# Patient Record
Sex: Male | Born: 1965 | Race: White | Hispanic: No | Marital: Married | Smoking: Never smoker
Health system: Southern US, Community
[De-identification: ages and names within clinical notes are randomized; demographics above are authoritative.]

## PROBLEM LIST (undated history)

## (undated) DIAGNOSIS — I10 Essential (primary) hypertension: Secondary | ICD-10-CM

## (undated) DIAGNOSIS — K219 Gastro-esophageal reflux disease without esophagitis: Secondary | ICD-10-CM

## (undated) DIAGNOSIS — Z96651 Presence of right artificial knee joint: Secondary | ICD-10-CM

## (undated) DIAGNOSIS — E7800 Pure hypercholesterolemia, unspecified: Secondary | ICD-10-CM

## (undated) DIAGNOSIS — E039 Hypothyroidism, unspecified: Secondary | ICD-10-CM

## (undated) DIAGNOSIS — M1711 Unilateral primary osteoarthritis, right knee: Secondary | ICD-10-CM

## (undated) DIAGNOSIS — G709 Myoneural disorder, unspecified: Secondary | ICD-10-CM

## (undated) DIAGNOSIS — Z789 Other specified health status: Secondary | ICD-10-CM

## (undated) DIAGNOSIS — M1712 Unilateral primary osteoarthritis, left knee: Secondary | ICD-10-CM

## (undated) DIAGNOSIS — T7840XA Allergy, unspecified, initial encounter: Secondary | ICD-10-CM

## (undated) DIAGNOSIS — G4733 Obstructive sleep apnea (adult) (pediatric): Secondary | ICD-10-CM

## (undated) DIAGNOSIS — N400 Enlarged prostate without lower urinary tract symptoms: Secondary | ICD-10-CM

## (undated) HISTORY — DX: Hypothyroidism, unspecified: E03.9

## (undated) HISTORY — DX: Obstructive sleep apnea (adult) (pediatric): G47.33

## (undated) HISTORY — PX: JOINT REPLACEMENT: SHX530

## (undated) HISTORY — PX: NO PAST SURGERIES: SHX2092

## (undated) HISTORY — DX: Essential (primary) hypertension: I10

## (undated) HISTORY — DX: Pure hypercholesterolemia, unspecified: E78.00

---

## 1999-02-08 ENCOUNTER — Encounter: Payer: Self-pay | Admitting: Family Medicine

## 1999-02-08 ENCOUNTER — Ambulatory Visit (HOSPITAL_COMMUNITY): Admission: RE | Admit: 1999-02-08 | Discharge: 1999-02-08 | Payer: Self-pay | Admitting: Family Medicine

## 2003-11-11 ENCOUNTER — Ambulatory Visit (HOSPITAL_COMMUNITY): Admission: RE | Admit: 2003-11-11 | Discharge: 2003-11-11 | Payer: Self-pay | Admitting: Family Medicine

## 2009-10-16 ENCOUNTER — Ambulatory Visit (HOSPITAL_BASED_OUTPATIENT_CLINIC_OR_DEPARTMENT_OTHER): Admission: RE | Admit: 2009-10-16 | Discharge: 2009-10-16 | Payer: Self-pay | Admitting: Cardiovascular Disease

## 2009-11-05 ENCOUNTER — Ambulatory Visit: Payer: Self-pay | Admitting: Internal Medicine

## 2012-03-11 ENCOUNTER — Encounter: Payer: Self-pay | Admitting: *Deleted

## 2015-12-17 HISTORY — PX: MENISCUS REPAIR: SHX5179

## 2016-04-04 DIAGNOSIS — M1711 Unilateral primary osteoarthritis, right knee: Secondary | ICD-10-CM | POA: Diagnosis not present

## 2016-04-04 DIAGNOSIS — M1712 Unilateral primary osteoarthritis, left knee: Secondary | ICD-10-CM | POA: Diagnosis not present

## 2016-05-02 DIAGNOSIS — M170 Bilateral primary osteoarthritis of knee: Secondary | ICD-10-CM | POA: Diagnosis not present

## 2016-05-10 DIAGNOSIS — G4733 Obstructive sleep apnea (adult) (pediatric): Secondary | ICD-10-CM | POA: Diagnosis not present

## 2016-06-04 DIAGNOSIS — M170 Bilateral primary osteoarthritis of knee: Secondary | ICD-10-CM | POA: Diagnosis not present

## 2016-06-25 DIAGNOSIS — E039 Hypothyroidism, unspecified: Secondary | ICD-10-CM | POA: Diagnosis not present

## 2016-06-25 DIAGNOSIS — K219 Gastro-esophageal reflux disease without esophagitis: Secondary | ICD-10-CM | POA: Diagnosis not present

## 2016-06-25 DIAGNOSIS — I10 Essential (primary) hypertension: Secondary | ICD-10-CM | POA: Diagnosis not present

## 2016-06-25 DIAGNOSIS — E7800 Pure hypercholesterolemia, unspecified: Secondary | ICD-10-CM | POA: Diagnosis not present

## 2016-06-28 DIAGNOSIS — E7800 Pure hypercholesterolemia, unspecified: Secondary | ICD-10-CM | POA: Diagnosis not present

## 2016-06-28 DIAGNOSIS — I10 Essential (primary) hypertension: Secondary | ICD-10-CM | POA: Diagnosis not present

## 2016-07-04 DIAGNOSIS — M170 Bilateral primary osteoarthritis of knee: Secondary | ICD-10-CM | POA: Diagnosis not present

## 2016-07-11 DIAGNOSIS — M170 Bilateral primary osteoarthritis of knee: Secondary | ICD-10-CM | POA: Diagnosis not present

## 2016-07-18 DIAGNOSIS — M170 Bilateral primary osteoarthritis of knee: Secondary | ICD-10-CM | POA: Diagnosis not present

## 2016-09-03 DIAGNOSIS — H16102 Unspecified superficial keratitis, left eye: Secondary | ICD-10-CM | POA: Diagnosis not present

## 2016-09-10 DIAGNOSIS — H16102 Unspecified superficial keratitis, left eye: Secondary | ICD-10-CM | POA: Diagnosis not present

## 2016-11-19 DIAGNOSIS — M170 Bilateral primary osteoarthritis of knee: Secondary | ICD-10-CM | POA: Diagnosis not present

## 2016-11-26 DIAGNOSIS — G4733 Obstructive sleep apnea (adult) (pediatric): Secondary | ICD-10-CM | POA: Diagnosis not present

## 2016-12-14 DIAGNOSIS — M25562 Pain in left knee: Secondary | ICD-10-CM | POA: Diagnosis not present

## 2016-12-14 DIAGNOSIS — M25561 Pain in right knee: Secondary | ICD-10-CM | POA: Diagnosis not present

## 2017-01-15 DIAGNOSIS — J111 Influenza due to unidentified influenza virus with other respiratory manifestations: Secondary | ICD-10-CM | POA: Diagnosis not present

## 2017-01-29 ENCOUNTER — Encounter (HOSPITAL_COMMUNITY): Payer: Self-pay | Admitting: Physician Assistant

## 2017-01-29 DIAGNOSIS — G4733 Obstructive sleep apnea (adult) (pediatric): Secondary | ICD-10-CM | POA: Diagnosis present

## 2017-01-29 DIAGNOSIS — E559 Vitamin D deficiency, unspecified: Secondary | ICD-10-CM | POA: Diagnosis present

## 2017-01-29 DIAGNOSIS — M1711 Unilateral primary osteoarthritis, right knee: Secondary | ICD-10-CM | POA: Diagnosis present

## 2017-01-29 DIAGNOSIS — E039 Hypothyroidism, unspecified: Secondary | ICD-10-CM | POA: Diagnosis present

## 2017-01-29 DIAGNOSIS — M25561 Pain in right knee: Secondary | ICD-10-CM | POA: Diagnosis not present

## 2017-01-29 DIAGNOSIS — I10 Essential (primary) hypertension: Secondary | ICD-10-CM | POA: Diagnosis present

## 2017-01-29 NOTE — H&P (Signed)
UNI KNEE ADMISSION H&P  Patient is being admitted for right total knee arthroplasty.  Subjective:  Chief Complaint:right knee pain.  HPI: Bryan [name], 51 y.o. male, has a history of pain and functional disability in the right knee due to arthritis and has failed non-surgical conservative treatments for greater than 12 weeks to includeNSAID's and/or analgesics, corticosteriod injections, viscosupplementation injections, flexibility and strengthening excercises, use of assistive devices, weight reduction as appropriate and activity modification.  Onset of symptoms was gradual, starting 10 years ago with gradually worsening course since that time. The patient noted prior procedures on the knee to include  arthroscopy and menisectomy on the right knee(s).  Patient currently rates pain in the right knee(s) at 10 out of 10 with activity. Patient has night pain, worsening of pain with activity and weight bearing, pain that interferes with activities of daily living, crepitus and joint swelling.  Patient has evidence of subchondral sclerosis, periarticular osteophytes and joint space narrowing by imaging studies.  There is no active infection.  Patient Active Problem List   Diagnosis Date Noted  . Primary localized osteoarthritis of right knee   . OSA (obstructive sleep apnea)   . Hypothyroidism   . Hypertension   . Vitamin D deficiency    Past Medical History:  Diagnosis Date  . Bell's palsy   . Elevated cholesterol   . Hypertension   . Hypothyroidism   . OSA (obstructive sleep apnea)    uses CPAP  . Primary localized osteoarthritis of right knee   . Vitamin D deficiency     No past surgical history on file.  No prescriptions prior to admission.   Allergies  Allergen Reactions  . Lipitor [Atorvastatin Calcium] Other (See Comments)    Muscle aches.  . Penicillins Hives and Swelling    Has patient had a PCN reaction causing immediate rash, facial/tongue/throat swelling, SOB or  lightheadedness with hypotension:unsure Has patient had a PCN reaction causing severe rash involving mucus membranes or skin necrosis:unsure Has patient had a PCN reaction that required hospitalization:No Has patient had a PCN reaction occurring within the last 10 years:No If all of the above answers are "NO", then may proceed with Cephalosporin use.     Social History  Substance Use Topics  . Smoking status: Never Smoker  . Smokeless tobacco: Not on file  . Alcohol use 0.0 oz/week     Comment: 1-2 daily beer    Family History  Problem Relation Age of Onset  . Heart attack Father     stent x 2  . Atrial fibrillation Father   . Deep vein thrombosis Mother   . Rheum arthritis Mother   . Pulmonary embolism    . Hypothyroidism       Review of Systems  Constitutional: Negative.   HENT: Negative.   Eyes: Negative.   Respiratory: Negative.   Cardiovascular: Negative.   Gastrointestinal: Negative.   Genitourinary: Negative.   Musculoskeletal: Positive for back pain and joint pain.  Skin: Negative.   Neurological: Negative.   Endo/Heme/Allergies: Negative.   Psychiatric/Behavioral: Negative.     Objective:  Physical Exam  Constitutional: He is oriented to person, place, and time. He appears well-developed and well-nourished.  HENT:  Head: Normocephalic and atraumatic.  Eyes: Conjunctivae are normal. Pupils are equal, round, and reactive to light.  Neck: Neck supple.  Cardiovascular: Normal rate and regular rhythm.   Respiratory: Effort normal.  GI: Soft.  Genitourinary:  Genitourinary Comments: Not pertinent to current symptomatology therefore not   examined.  Musculoskeletal:   Examination of both knees reveals pain medially bilaterally.  1+ crepitation.  1+ synovitis.  Mild varus deformity.  Range of motion 0-120 degrees.  Knees are stable with normal patella tracking.  Vascular exam: Pulses are 2+ and symmetric.  Neurologic exam: Distal motor and sensory examination is  within normal limits.    Neurological: He is alert and oriented to person, place, and time.  Skin: Skin is warm and dry.  Psychiatric: He has a normal mood and affect. His behavior is normal.    Vital signs in last 24 hours: Temp:  [97.4 F (36.3 C)] 97.4 F (36.3 C) (02/14 1500) Pulse Rate:  [68] 68 (02/14 1500) BP: (156)/(80) 156/80 (02/14 1500) SpO2:  [96 %] 96 % (02/14 1500) Weight:  [145.6 kg (321 lb)] 145.6 kg (321 lb) (02/14 1500)  Labs:   Estimated body mass index is 41.21 kg/m as calculated from the following:   Height as of this encounter: 6' 2" (1.88 m).   Weight as of this encounter: 145.6 kg (321 lb).   Imaging Review Plain radiographs demonstrate severe degenerative joint disease of the right knee(s). The overall alignment issignificant varus. The bone quality appears to be good for age and reported activity level.  Assessment/Plan:  End stage arthritis, right knee  Principal Problem:   Primary localized osteoarthritis of right knee Active Problems:   OSA (obstructive sleep apnea)   Hypothyroidism   Hypertension   Vitamin D deficiency   The patient history, physical examination, clinical judgment of the provider and imaging studies are consistent with end stage degenerative joint disease of the right knee(s) and total knee arthroplasty is deemed medically necessary. The treatment options including medical management, injection therapy arthroscopy and arthroplasty were discussed at length. The risks and benefits of total knee arthroplasty were presented and reviewed. The risks due to aseptic loosening, infection, stiffness, patella tracking problems, thromboembolic complications and other imponderables were discussed. The patient acknowledged the explanation, agreed to proceed with the plan and consent was signed. Patient is being admitted for inpatient treatment for surgery, pain control, PT, OT, prophylactic antibiotics, VTE prophylaxis, progressive ambulation  and ADL's and discharge planning. The patient is planning to be discharged home with home health services  

## 2017-01-30 ENCOUNTER — Encounter (HOSPITAL_COMMUNITY): Payer: Self-pay

## 2017-01-30 ENCOUNTER — Ambulatory Visit (HOSPITAL_COMMUNITY)
Admission: RE | Admit: 2017-01-30 | Discharge: 2017-01-30 | Disposition: A | Discharge status: Home / Self Care | Payer: BLUE CROSS/BLUE SHIELD | Source: Ambulatory Visit | Attending: Anesthesiology | Admitting: Anesthesiology

## 2017-01-30 ENCOUNTER — Encounter (HOSPITAL_COMMUNITY)
Admission: RE | Admit: 2017-01-30 | Discharge: 2017-01-30 | Disposition: A | Discharge status: Home / Self Care | Payer: BLUE CROSS/BLUE SHIELD | Source: Ambulatory Visit | Attending: Orthopedic Surgery | Admitting: Orthopedic Surgery

## 2017-01-30 DIAGNOSIS — J111 Influenza due to unidentified influenza virus with other respiratory manifestations: Secondary | ICD-10-CM

## 2017-01-30 DIAGNOSIS — Z01818 Encounter for other preprocedural examination: Secondary | ICD-10-CM | POA: Diagnosis not present

## 2017-01-30 DIAGNOSIS — Z01812 Encounter for preprocedural laboratory examination: Secondary | ICD-10-CM | POA: Insufficient documentation

## 2017-01-30 DIAGNOSIS — J9811 Atelectasis: Secondary | ICD-10-CM | POA: Diagnosis not present

## 2017-01-30 DIAGNOSIS — Z0183 Encounter for blood typing: Secondary | ICD-10-CM | POA: Insufficient documentation

## 2017-01-30 DIAGNOSIS — M1711 Unilateral primary osteoarthritis, right knee: Secondary | ICD-10-CM | POA: Diagnosis not present

## 2017-01-30 DIAGNOSIS — I517 Cardiomegaly: Secondary | ICD-10-CM | POA: Diagnosis not present

## 2017-01-30 HISTORY — DX: Myoneural disorder, unspecified: G70.9

## 2017-01-30 LAB — COMPREHENSIVE METABOLIC PANEL
ALBUMIN: 3.8 g/dL (ref 3.5–5.0)
ALK PHOS: 53 U/L (ref 38–126)
ALT: 55 U/L (ref 17–63)
ANION GAP: 9 (ref 5–15)
AST: 34 U/L (ref 15–41)
BUN: 11 mg/dL (ref 6–20)
CO2: 26 mmol/L (ref 22–32)
Calcium: 9 mg/dL (ref 8.9–10.3)
Chloride: 106 mmol/L (ref 101–111)
Creatinine, Ser: 1.03 mg/dL (ref 0.61–1.24)
GFR calc Af Amer: >60 mL/min (ref >60)
GFR calc non Af Amer: >60 mL/min (ref >60)
GLUCOSE: 158 mg/dL — AB (ref 65–99)
Potassium: 4.2 mmol/L (ref 3.5–5.1)
SODIUM: 141 mmol/L (ref 135–145)
Total Bilirubin: 1 mg/dL (ref 0.3–1.2)
Total Protein: 6.6 g/dL (ref 6.5–8.1)

## 2017-01-30 LAB — CBC WITH DIFFERENTIAL/PLATELET
BASOS ABS: 0 10*3/uL (ref 0.0–0.1)
BASOS PCT: 0 %
EOS ABS: 0.1 10*3/uL (ref 0.0–0.7)
Eosinophils Relative: 1 %
HCT: 43.6 % (ref 39.0–52.0)
HEMOGLOBIN: 15 g/dL (ref 13.0–17.0)
Lymphocytes Relative: 27 %
Lymphs Abs: 1.7 10*3/uL (ref 0.7–4.0)
MCH: 31.4 pg (ref 26.0–34.0)
MCHC: 34.4 g/dL (ref 30.0–36.0)
MCV: 91.2 fL (ref 78.0–100.0)
MONOS PCT: 9 %
Monocytes Absolute: 0.6 10*3/uL (ref 0.1–1.0)
NEUTROS PCT: 63 %
Neutro Abs: 3.9 10*3/uL (ref 1.7–7.7)
Platelets: 254 10*3/uL (ref 150–400)
RBC: 4.78 MIL/uL (ref 4.22–5.81)
RDW: 13.1 % (ref 11.5–15.5)
WBC: 6.3 10*3/uL (ref 4.0–10.5)

## 2017-01-30 LAB — ABO/RH: ABO/RH(D): A POS

## 2017-01-30 LAB — TYPE AND SCREEN
ABO/RH(D): A POS
ANTIBODY SCREEN: NEGATIVE

## 2017-01-30 LAB — PROTIME-INR
INR: 0.96
Prothrombin Time: 12.7 seconds (ref 11.4–15.2)

## 2017-01-30 LAB — APTT: APTT: 28 s (ref 24–36)

## 2017-01-30 LAB — SURGICAL PCR SCREEN
MRSA, PCR: NEGATIVE
Staphylococcus aureus: POSITIVE — AB

## 2017-01-30 MED ORDER — CHLORHEXIDINE GLUCONATE 4 % EX LIQD: 60.0000 mL | Freq: Once | CUTANEOUS | Status: DC

## 2017-01-30 MED ORDER — POVIDONE-IODINE 7.5 % EX SOLN
Freq: Once | CUTANEOUS | Status: DC
  Filled 2017-01-30: qty 118

## 2017-01-30 NOTE — Progress Notes (Signed)
[facility] on [address] in [place], [place].  Patient notified

## 2017-01-30 NOTE — Progress Notes (Addendum)
PCP: Lurena Joiner, MD  Cardiologist: denies in past year  ECG: denies in past year  Stress: denies in past year  Cardiac Cath: denies in past year  ECHO: denies in past year  Chest x-ray: denies in past year

## 2017-01-30 NOTE — Pre-Procedure Instructions (Addendum)
    Bryan [name]  01/30/2017      [facility] [place] [name] [place] - [address]  [address] Phone: [phone] Fax: [phone]    Your procedure is scheduled on Mon. Feb 26 @ 0945.  Report to [facility] Admitting at [phone] A.M.  Call this number if you have problems the morning of surgery:  [phone]   Remember:  Do not eat food or drink liquids after midnight.  Take these medicines the morning of surgery with A SIP OF WATER nebivolol (bystolic), levothyroxine (synthroid), loratadine (claritin), raitidine (zantac).    Stop taking any Vitamins or Herbal Medications. No Goody's, BC's, Aleve, Advil, Motrin, Ibuprofen or fish oil.   Do not wear lotions, powders, or colognes, or deoderant.  Men may shave face and neck.  Do not bring valuables to the hospital.  [facility] is not responsible for any belongings or valuables.  Contacts, dentures or bridgework may not be worn into surgery.  Leave your suitcase in the car.  After surgery it may be brought to your room.  For patients admitted to the hospital, discharge time will be determined by your treatment team.  Patients discharged the day of surgery will not be allowed to drive home.    Special instructions: See attached  Please read over the following fact sheets that you were given. Pain Booklet, Coughing and Deep Breathing, MRSA Information and Surgical Site Infection Prevention

## 2017-01-31 LAB — URINE CULTURE: Culture: NO GROWTH

## 2017-02-03 DIAGNOSIS — E039 Hypothyroidism, unspecified: Secondary | ICD-10-CM | POA: Diagnosis not present

## 2017-02-03 DIAGNOSIS — R739 Hyperglycemia, unspecified: Secondary | ICD-10-CM | POA: Diagnosis not present

## 2017-02-03 DIAGNOSIS — E782 Mixed hyperlipidemia: Secondary | ICD-10-CM | POA: Diagnosis not present

## 2017-02-03 DIAGNOSIS — K219 Gastro-esophageal reflux disease without esophagitis: Secondary | ICD-10-CM | POA: Diagnosis not present

## 2017-02-03 DIAGNOSIS — I10 Essential (primary) hypertension: Secondary | ICD-10-CM | POA: Diagnosis not present

## 2017-02-10 ENCOUNTER — Encounter (HOSPITAL_COMMUNITY): Admission: RE | Payer: Self-pay | Source: Ambulatory Visit

## 2017-02-10 ENCOUNTER — Inpatient Hospital Stay (HOSPITAL_COMMUNITY)
Admission: RE | Admit: 2017-02-10 | Payer: BLUE CROSS/BLUE SHIELD | Source: Ambulatory Visit | Admitting: Orthopedic Surgery

## 2017-02-10 HISTORY — DX: Unilateral primary osteoarthritis, right knee: M17.11

## 2017-02-10 SURGERY — ARTHROPLASTY, KNEE, UNICOMPARTMENTAL
Anesthesia: General | Laterality: Right

## 2017-02-14 ENCOUNTER — Encounter: Payer: Self-pay | Admitting: Cardiovascular Disease

## 2017-02-14 ENCOUNTER — Ambulatory Visit (INDEPENDENT_AMBULATORY_CARE_PROVIDER_SITE_OTHER): Payer: BLUE CROSS/BLUE SHIELD | Admitting: Cardiovascular Disease

## 2017-02-14 VITALS — BP 154/88 | HR 68 | Ht 74.0 in | Wt 316.4 lb

## 2017-02-14 DIAGNOSIS — I10 Essential (primary) hypertension: Secondary | ICD-10-CM

## 2017-02-14 DIAGNOSIS — E785 Hyperlipidemia, unspecified: Secondary | ICD-10-CM | POA: Insufficient documentation

## 2017-02-14 DIAGNOSIS — Z01818 Encounter for other preprocedural examination: Secondary | ICD-10-CM | POA: Diagnosis not present

## 2017-02-14 DIAGNOSIS — Z01810 Encounter for preprocedural cardiovascular examination: Secondary | ICD-10-CM | POA: Diagnosis not present

## 2017-02-14 DIAGNOSIS — E7800 Pure hypercholesterolemia, unspecified: Secondary | ICD-10-CM

## 2017-02-14 DIAGNOSIS — Z8249 Family history of ischemic heart disease and other diseases of the circulatory system: Secondary | ICD-10-CM

## 2017-02-14 NOTE — Patient Instructions (Signed)
Medication Instructions: Your physician recommends that you continue on your current medications as directed. Please refer to the Current Medication list given to you today.   Testing/Procedures: Your physician has requested that you have an echocardiogram for preoperative clearance. Echocardiography is a painless test that uses sound waves to create images of your heart. It provides your doctor with information about the size and shape of your heart and how well your heart's chambers and valves are working. This procedure takes approximately one hour. There are no restrictions for this procedure.  Follow-Up:  Your physician recommends that you schedule a follow-up appointment as needed with Dr. Allyson Sabal.

## 2017-02-14 NOTE — Assessment & Plan Note (Signed)
History of hyperlipidemia on Crestor followed by his PCP 

## 2017-02-14 NOTE — Progress Notes (Signed)
02/14/2017 [name]   12/03/1966  [id number]  Primary Physician Lolita Patella, MD Primary Cardiologist: Runell Gess MD Roseanne Reno  HPI:  Mr. Limas  is a 51 year old severely overweight married Caucasian male father of 2 daughters was accompanied by his wife Waynetta Sandy who was a cath lab nurse and office nurse as well. He is referred for preoperative clearance before elective staged partial bilateral total knee replacements by Dr. Thurston Hole . He has no prior cardiac history. Risk factors include family history with father who has had multiple ischemic events and has an LVAD followed by Dr. Gala Romney.  He does have a history of treated hypertension and hyperlipidemia as well. He has never had a heart attack or stroke. He denies chest pain or shortness of breath although he does admit to being deconditioned. Screening EKG did show T-wave abnormalities and apparently a chest x-ray showed cardiomegaly.   Current Outpatient Prescriptions  Medication Sig Dispense Refill  . acetaminophen (TYLENOL 8 HOUR ARTHRITIS PAIN) 650 MG CR tablet Take 1,300 mg by mouth 2 (two) times daily as needed for pain.     . clobetasol ointment (TEMOVATE) 0.05 % Apply 1 application topically daily as needed (for psorasis).     Marland Kitchen diclofenac (VOLTAREN) 75 MG EC tablet Take 75 mg by mouth 2 (two) times daily.    . Emollient (MOISTURIZING LOTION EX) Apply 1 application topically 3 (three) times daily as needed (for dry skin therapy.). [place] Hand Lotion.    Marland Kitchen levothyroxine (SYNTHROID, LEVOTHROID) 300 MCG tablet Take 300 mcg by mouth daily before breakfast.    . lisinopril (PRINIVIL,ZESTRIL) 20 MG tablet Take 20 mg by mouth daily.    Marland Kitchen loratadine (CLARITIN) 10 MG tablet Take 10 mg by mouth daily.    . nebivolol (BYSTOLIC) 10 MG tablet Take 10 mg by mouth daily.    . ranitidine (ZANTAC) 150 MG capsule Take 150 mg by mouth 2 (two) times daily.    . rosuvastatin (CRESTOR) 20 MG tablet Take 20 mg  by mouth at bedtime.     . tamsulosin (FLOMAX) 0.4 MG CAPS capsule Take 0.4 mg by mouth at bedtime.     No current facility-administered medications for this visit.     Allergies  Allergen Reactions  . Lipitor [Atorvastatin Calcium] Other (See Comments)    Muscle aches.  . Penicillins Hives and Swelling    Has patient had a PCN reaction causing immediate rash, facial/tongue/throat swelling, SOB or lightheadedness with hypotension:unsure Has patient had a PCN reaction causing severe rash involving mucus membranes or skin necrosis:unsure Has patient had a PCN reaction that required hospitalization:No Has patient had a PCN reaction occurring within the last 10 years:No If all of the above answers are "NO", then may proceed with Cephalosporin use.     Social History   Social History  . Marital status: Single    Spouse name: N/A  . Number of children: N/A  . Years of education: N/A   Occupational History  . 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 Smoking status: Never Smoker  . Smokeless tobacco: Former Neurosurgeon    Quit date: 01/30/2007  . Alcohol use 0.0 oz/week     Comment: 1-2 daily beer  . Drug use: No  . Sexual activity: Not on file   Other Topics Concern  . Not on file   Social History Narrative  . No narrative on  file     Review of Systems: General: negative for chills, fever, night sweats or weight changes.  Cardiovascular: negative for chest pain, dyspnea on exertion, edema, orthopnea, palpitations, paroxysmal nocturnal dyspnea or shortness of breath Dermatological: negative for rash Respiratory: negative for cough or wheezing Urologic: negative for hematuria Abdominal: negative for nausea, vomiting, diarrhea, bright red blood per rectum, melena, or hematemesis Neurologic: negative for visual changes, syncope, or dizziness All other systems reviewed and are otherwise negative except as noted above.    Blood pressure  (!) 154/88, pulse 68, height 6\' 2"  (1.88 m), weight (!) 316 lb 6.4 oz (143.5 kg), SpO2 97 %.  General appearance: alert and no distress Neck: no adenopathy, no carotid bruit, no JVD, supple, symmetrical, trachea midline and thyroid not enlarged, symmetric, no tenderness/mass/nodules Lungs: clear to auscultation bilaterally Heart: regular rate and rhythm, S1, S2 normal, no murmur, click, rub or gallop Extremities: extremities normal, atraumatic, no cyanosis or edema  EKG not performed today. EKG performed 01/30/17 showed sinus rhythm at 77 with nonspecific ST and T-wave changes. I personally reviewed that EKG.  ASSESSMENT AND PLAN:   Preoperative clearance Derran presents today for preoperative clearance before staged bilateral partial total knee replacements by Dr. Thurston Hole . He has a history of treated hypertension, hyperlipidemia, family history of heart disease. He does have a mildly abnormal EKG with inferior T-wave inversion apparently had cardiomegaly on chest x-ray. He has no history of prior heart attack or stroke nor does he have chest pain or shortness of breath. I am going to get a 2-D echo to evaluate LV size and function. This is normal we will clear him for his upcoming orthopedic surgical procedure at low risk.  Hypertension History of essential hypertension with blood pressure measured today at 154/88. He is on Bystolic  and Prinivil. Continue current meds at current dosing.  Hyperlipidemia History of hyperlipidemia on Crestor followed by his PCP      Runell Gess MD [facility], [facility] 02/14/2017 8:56 AM

## 2017-02-14 NOTE — Assessment & Plan Note (Signed)
History of essential hypertension with blood pressure measured today at 154/88. He is on Bystolic  and Prinivil. Continue current meds at current dosing.

## 2017-02-14 NOTE — Assessment & Plan Note (Signed)
Bryan [name] presents today for preoperative clearance before staged bilateral partial total knee replacements by Dr. Thurston Hole . He has a history of treated hypertension, hyperlipidemia, family history of heart disease. He does have a mildly abnormal EKG with inferior T-wave inversion apparently had cardiomegaly on chest x-ray. He has no history of prior heart attack or stroke nor does he have chest pain or shortness of breath. I am going to get a 2-D echo to evaluate LV size and function. This is normal we will clear him for his upcoming orthopedic surgical procedure at low risk.

## 2017-02-17 NOTE — Progress Notes (Addendum)
Called and left confidential VM for Sherri G. @ Dr. Sherene Sires office requesting orders be put in [facility]. EF, RN 02/17/17 1430   Called and spoke with Sherri G. @ Dr. Sherene Sires office and requested orders be entered into [facility]. She said she would let Dr. Thurston Hole know once he is out of the Or. Ef., RN 02/18/17 0930

## 2017-02-17 NOTE — Pre-Procedure Instructions (Signed)
    Bryan [name]  02/17/2017      [facility] [address] [place] [name] [name] - [name] [address] Phone: [phone] Fax: [phone]    Your procedure is scheduled on Wed. Mar. 14 @ 830 AM .  Report to [facility] at 630 A.M.  Call this number if you have problems the morning of surgery:  [phone]   Remember:  Do not eat food or drink liquids after midnight.  Take these medicines the morning of surgery with A SIP OF WATER levothyroxine (synthroid), nebivolol (bystolic), ranitidine (zantac).  Stop taking tylenol arthritis, diclofenac (voltaren), any herbal medication or vitamins, Goody's, Bc's, Aleve, Ibuprofen, Advil.   Do not wear jewelry, make-up or nail polish.  Do not wear lotions, powders, or perfumes, or deoderant.  Do not shave 48 hours prior to surgery.  Men may shave face and neck.  Do not bring valuables to the hospital.  [facility] is not responsible for any belongings or valuables.  Contacts, dentures or bridgework may not be worn into surgery.  Leave your suitcase in the car.  After surgery it may be brought to your room.  For patients admitted to the hospital, discharge time will be determined by your treatment team.  Patients discharged the day of surgery will not be allowed to drive home.    Special instructions:  See attached  Please read over the following fact sheets that you were given. Pain Booklet, Coughing and Deep Breathing, MRSA Information and Surgical Site Infection Prevention

## 2017-02-18 ENCOUNTER — Encounter (HOSPITAL_COMMUNITY)
Admission: RE | Admit: 2017-02-18 | Discharge: 2017-02-18 | Disposition: A | Discharge status: Home / Self Care | Payer: BLUE CROSS/BLUE SHIELD | Source: Ambulatory Visit | Attending: Orthopedic Surgery | Admitting: Orthopedic Surgery

## 2017-02-18 ENCOUNTER — Encounter (HOSPITAL_COMMUNITY): Payer: Self-pay

## 2017-02-18 ENCOUNTER — Encounter (HOSPITAL_COMMUNITY)
Admission: RE | Admit: 2017-02-18 | Discharge: 2017-02-18 | Disposition: A | Discharge status: Home / Self Care | Payer: BLUE CROSS/BLUE SHIELD | Source: Ambulatory Visit | Attending: Physician Assistant | Admitting: Physician Assistant

## 2017-02-18 DIAGNOSIS — M1711 Unilateral primary osteoarthritis, right knee: Secondary | ICD-10-CM | POA: Diagnosis not present

## 2017-02-18 DIAGNOSIS — I517 Cardiomegaly: Secondary | ICD-10-CM | POA: Diagnosis not present

## 2017-02-18 DIAGNOSIS — Z01818 Encounter for other preprocedural examination: Secondary | ICD-10-CM | POA: Diagnosis not present

## 2017-02-18 DIAGNOSIS — J111 Influenza due to unidentified influenza virus with other respiratory manifestations: Secondary | ICD-10-CM

## 2017-02-18 DIAGNOSIS — Z8249 Family history of ischemic heart disease and other diseases of the circulatory system: Secondary | ICD-10-CM | POA: Diagnosis not present

## 2017-02-18 DIAGNOSIS — I501 Left ventricular failure: Secondary | ICD-10-CM | POA: Diagnosis not present

## 2017-02-18 LAB — TYPE AND SCREEN
ABO/RH(D): A POS
ANTIBODY SCREEN: NEGATIVE

## 2017-02-18 LAB — BASIC METABOLIC PANEL
ANION GAP: 7 (ref 5–15)
BUN: 11 mg/dL (ref 6–20)
CALCIUM: 9.3 mg/dL (ref 8.9–10.3)
CO2: 23 mmol/L (ref 22–32)
Chloride: 110 mmol/L (ref 101–111)
Creatinine, Ser: 0.86 mg/dL (ref 0.61–1.24)
GLUCOSE: 106 mg/dL — AB (ref 65–99)
POTASSIUM: 3.9 mmol/L (ref 3.5–5.1)
Sodium: 140 mmol/L (ref 135–145)

## 2017-02-18 LAB — CBC
HEMATOCRIT: 43.6 % (ref 39.0–52.0)
Hemoglobin: 15.2 g/dL (ref 13.0–17.0)
MCH: 31.5 pg (ref 26.0–34.0)
MCHC: 34.9 g/dL (ref 30.0–36.0)
MCV: 90.3 fL (ref 78.0–100.0)
PLATELETS: 217 10*3/uL (ref 150–400)
RBC: 4.83 MIL/uL (ref 4.22–5.81)
RDW: 13 % (ref 11.5–15.5)
WBC: 5 10*3/uL (ref 4.0–10.5)

## 2017-02-18 LAB — SURGICAL PCR SCREEN
MRSA, PCR: NEGATIVE
STAPHYLOCOCCUS AUREUS: POSITIVE — AB

## 2017-02-18 NOTE — Progress Notes (Addendum)
PCP: Dr. Elias Else  Cardiologist: Dr. Rosita Fire  EKG: 01/2017  Stress Test: pt denies  ECHO: scheduled 02/20/17 at Dr. Hazle Coca office for final clearance per pt  Cardiac Cath: pt denies  Chest x-ray: 01/2017

## 2017-02-19 LAB — URINE CULTURE: CULTURE: NO GROWTH

## 2017-02-19 NOTE — Progress Notes (Addendum)
Anesthesia Chart Review:  Pt is a 51 year old male scheduled for R unicompartmental knee on 02/26/2017 with Salvatore Marvel, MD  - PCP is Elias Else, MD.  - Pt saw Nanetta Batty, MD with cardiology 02/14/17 for pre-op eval. Echo ordered for 02/20/17.   PMH includes:  HTN, hyperlipidemia, hypothyroidism, OSA. Never smoker. BMI 40.5  Medications include: Levothyroxine, lisinopril, nebivolol, Zantac, Crestor  Preoperative labs reviewed.    CXR 02/18/17: No active cardiopulmonary disease.  EKG 01/30/17: NSR. Nonspecific T wave abnormality  Will revisit chart when echo available.   Rica Mast, FNP-BC [facility]/Anesthesiology Phone: [phone] 02/19/2017 3:28 PM  Echo 02/20/17:  - Left ventricle: The cavity size was normal. Wall thickness was increased in a pattern of mild LVH. Systolic function was normal. The estimated ejection fraction was in the range of 60% to 65%. Doppler parameters are consistent with abnormal left ventricular relaxation (grade 1 diastolic dysfunction).  If no changes, I anticipate pt can proceed with surgery as scheduled.    Rica Mast, FNP-BC [facility] Stay Surgical [facility]/Anesthesiology Phone: [phone] 02/21/2017 2:39 PM

## 2017-02-20 ENCOUNTER — Ambulatory Visit (HOSPITAL_COMMUNITY)
Admission: RE | Admit: 2017-02-20 | Discharge: 2017-02-20 | Disposition: A | Discharge status: Home / Self Care | Payer: BLUE CROSS/BLUE SHIELD | Source: Ambulatory Visit | Attending: Cardiovascular Disease | Admitting: Cardiovascular Disease

## 2017-02-20 DIAGNOSIS — Z8249 Family history of ischemic heart disease and other diseases of the circulatory system: Secondary | ICD-10-CM | POA: Diagnosis not present

## 2017-02-20 DIAGNOSIS — Z01810 Encounter for preprocedural cardiovascular examination: Secondary | ICD-10-CM

## 2017-02-20 DIAGNOSIS — I501 Left ventricular failure: Secondary | ICD-10-CM | POA: Diagnosis not present

## 2017-02-20 DIAGNOSIS — M1711 Unilateral primary osteoarthritis, right knee: Secondary | ICD-10-CM | POA: Insufficient documentation

## 2017-02-20 DIAGNOSIS — I10 Essential (primary) hypertension: Secondary | ICD-10-CM

## 2017-02-20 DIAGNOSIS — I517 Cardiomegaly: Secondary | ICD-10-CM | POA: Insufficient documentation

## 2017-02-20 DIAGNOSIS — Z01818 Encounter for other preprocedural examination: Secondary | ICD-10-CM | POA: Insufficient documentation

## 2017-02-20 NOTE — Progress Notes (Signed)
  Echocardiogram 2D Echocardiogram has been performed.  Bryan [name] 02/20/2017, 11:06 AM

## 2017-02-25 MED ORDER — LACTATED RINGERS IV SOLN: INTRAVENOUS | Status: DC

## 2017-02-25 MED ORDER — SODIUM CHLORIDE 0.9 % IV SOLN
1500.0000 mg | INTRAVENOUS | Status: AC
  Administered 2017-02-26: 1500 mg via INTRAVENOUS
  Filled 2017-02-25: qty 1500

## 2017-02-26 ENCOUNTER — Encounter (HOSPITAL_COMMUNITY): Payer: Self-pay | Admitting: Urology

## 2017-02-26 ENCOUNTER — Ambulatory Visit (HOSPITAL_COMMUNITY)
Admission: RE | Admit: 2017-02-26 | Discharge: 2017-02-27 | Disposition: A | Discharge status: Home / Self Care | Payer: BLUE CROSS/BLUE SHIELD | Source: Ambulatory Visit | Attending: Orthopedic Surgery | Admitting: Orthopedic Surgery

## 2017-02-26 ENCOUNTER — Ambulatory Visit (HOSPITAL_COMMUNITY): Payer: BLUE CROSS/BLUE SHIELD | Admitting: Emergency Medicine

## 2017-02-26 ENCOUNTER — Encounter (HOSPITAL_COMMUNITY)
Admission: RE | Disposition: A | Discharge status: Home / Self Care | Payer: Self-pay | Source: Ambulatory Visit | Attending: Orthopedic Surgery

## 2017-02-26 ENCOUNTER — Ambulatory Visit (HOSPITAL_COMMUNITY): Payer: BLUE CROSS/BLUE SHIELD | Admitting: Certified Registered Nurse Anesthetist

## 2017-02-26 DIAGNOSIS — E039 Hypothyroidism, unspecified: Secondary | ICD-10-CM | POA: Diagnosis not present

## 2017-02-26 DIAGNOSIS — G4733 Obstructive sleep apnea (adult) (pediatric): Secondary | ICD-10-CM | POA: Diagnosis present

## 2017-02-26 DIAGNOSIS — Z9989 Dependence on other enabling machines and devices: Secondary | ICD-10-CM | POA: Insufficient documentation

## 2017-02-26 DIAGNOSIS — R785 Finding of other psychotropic drug in blood: Secondary | ICD-10-CM | POA: Diagnosis not present

## 2017-02-26 DIAGNOSIS — Z8249 Family history of ischemic heart disease and other diseases of the circulatory system: Secondary | ICD-10-CM

## 2017-02-26 DIAGNOSIS — E559 Vitamin D deficiency, unspecified: Secondary | ICD-10-CM | POA: Diagnosis present

## 2017-02-26 DIAGNOSIS — I10 Essential (primary) hypertension: Secondary | ICD-10-CM | POA: Diagnosis present

## 2017-02-26 DIAGNOSIS — Z79899 Other long term (current) drug therapy: Secondary | ICD-10-CM | POA: Insufficient documentation

## 2017-02-26 DIAGNOSIS — G8918 Other acute postprocedural pain: Secondary | ICD-10-CM | POA: Diagnosis not present

## 2017-02-26 DIAGNOSIS — E785 Hyperlipidemia, unspecified: Secondary | ICD-10-CM | POA: Diagnosis present

## 2017-02-26 DIAGNOSIS — E7800 Pure hypercholesterolemia, unspecified: Secondary | ICD-10-CM | POA: Diagnosis not present

## 2017-02-26 DIAGNOSIS — M1711 Unilateral primary osteoarthritis, right knee: Secondary | ICD-10-CM | POA: Diagnosis present

## 2017-02-26 DIAGNOSIS — M25561 Pain in right knee: Secondary | ICD-10-CM | POA: Diagnosis present

## 2017-02-26 HISTORY — PX: TOTAL KNEE ARTHROPLASTY: SHX125

## 2017-02-26 SURGERY — ARTHROPLASTY, KNEE, TOTAL
Anesthesia: Monitor Anesthesia Care | Site: Knee | Laterality: Right

## 2017-02-26 MED ORDER — MENTHOL 3 MG MT LOZG: 1.0000 | LOZENGE | OROMUCOSAL | Status: DC | PRN

## 2017-02-26 MED ORDER — BUPIVACAINE HCL (PF) 0.25 % IJ SOLN
INTRAMUSCULAR | Status: AC
  Filled 2017-02-26: qty 30

## 2017-02-26 MED ORDER — PHENOL 1.4 % MT LIQD: 1.0000 | OROMUCOSAL | Status: DC | PRN

## 2017-02-26 MED ORDER — POVIDONE-IODINE 7.5 % EX SOLN
Freq: Once | CUTANEOUS | Status: DC
  Filled 2017-02-26: qty 118

## 2017-02-26 MED ORDER — FAMOTIDINE 20 MG PO TABS
20.0000 mg | ORAL_TABLET | Freq: Two times a day (BID) | ORAL | Status: DC
  Administered 2017-02-26 – 2017-02-27 (×2): 20 mg via ORAL
  Filled 2017-02-26 (×2): qty 1

## 2017-02-26 MED ORDER — DEXAMETHASONE SODIUM PHOSPHATE 10 MG/ML IJ SOLN
INTRAMUSCULAR | Status: AC
  Filled 2017-02-26: qty 1

## 2017-02-26 MED ORDER — PROPOFOL 500 MG/50ML IV EMUL
INTRAVENOUS | Status: DC | PRN
  Administered 2017-02-26: 60 ug/kg/min via INTRAVENOUS

## 2017-02-26 MED ORDER — METOCLOPRAMIDE HCL 5 MG PO TABS: 5.0000 mg | ORAL_TABLET | Freq: Three times a day (TID) | ORAL | Status: DC | PRN

## 2017-02-26 MED ORDER — ACETAMINOPHEN 650 MG RE SUPP: 650.0000 mg | Freq: Four times a day (QID) | RECTAL | Status: DC | PRN

## 2017-02-26 MED ORDER — PHENYLEPHRINE 40 MCG/ML (10ML) SYRINGE FOR IV PUSH (FOR BLOOD PRESSURE SUPPORT)
PREFILLED_SYRINGE | INTRAVENOUS | Status: DC | PRN
  Administered 2017-02-26 (×2): 80 ug via INTRAVENOUS

## 2017-02-26 MED ORDER — LACTATED RINGERS IV SOLN
INTRAVENOUS | Status: DC | PRN
  Administered 2017-02-26: 08:00:00 via INTRAVENOUS

## 2017-02-26 MED ORDER — ALUM & MAG HYDROXIDE-SIMETH 200-200-20 MG/5ML PO SUSP
30.0000 mL | ORAL | Status: DC | PRN
Start: 2017-02-26 — End: 2017-02-27

## 2017-02-26 MED ORDER — TAMSULOSIN HCL 0.4 MG PO CAPS: 0.4000 mg | ORAL_CAPSULE | Freq: Every day | ORAL | Status: DC

## 2017-02-26 MED ORDER — DEXAMETHASONE SODIUM PHOSPHATE 10 MG/ML IJ SOLN
INTRAMUSCULAR | Status: DC | PRN
  Administered 2017-02-26: 10 mg via INTRAVENOUS

## 2017-02-26 MED ORDER — MIDAZOLAM HCL 5 MG/5ML IJ SOLN
INTRAMUSCULAR | Status: DC | PRN
  Administered 2017-02-26: 2 mg via INTRAVENOUS

## 2017-02-26 MED ORDER — ROPIVACAINE HCL 7.5 MG/ML IJ SOLN
INTRAMUSCULAR | Status: DC | PRN
  Administered 2017-02-26: 20 mL via PERINEURAL

## 2017-02-26 MED ORDER — TOBRAMYCIN SULFATE 1.2 G IJ SOLR
INTRAMUSCULAR | Status: DC | PRN
  Administered 2017-02-26: 1.2 g

## 2017-02-26 MED ORDER — PROPOFOL 10 MG/ML IV BOLUS
INTRAVENOUS | Status: AC
  Filled 2017-02-26: qty 20

## 2017-02-26 MED ORDER — ASPIRIN EC 325 MG PO TBEC
325.0000 mg | DELAYED_RELEASE_TABLET | Freq: Every day | ORAL | Status: DC
  Administered 2017-02-27: 325 mg via ORAL
  Filled 2017-02-26: qty 1

## 2017-02-26 MED ORDER — CHLORHEXIDINE GLUCONATE 4 % EX LIQD: 60.0000 mL | Freq: Once | CUTANEOUS | Status: DC

## 2017-02-26 MED ORDER — LISINOPRIL 20 MG PO TABS: 20.0000 mg | ORAL_TABLET | Freq: Every day | ORAL | Status: DC

## 2017-02-26 MED ORDER — ONDANSETRON HCL 4 MG PO TABS: 4.0000 mg | ORAL_TABLET | Freq: Four times a day (QID) | ORAL | Status: DC | PRN

## 2017-02-26 MED ORDER — CEFUROXIME SODIUM 750 MG IJ SOLR
INTRAMUSCULAR | Status: AC
  Filled 2017-02-26: qty 750

## 2017-02-26 MED ORDER — PHENYLEPHRINE 40 MCG/ML (10ML) SYRINGE FOR IV PUSH (FOR BLOOD PRESSURE SUPPORT)
PREFILLED_SYRINGE | INTRAVENOUS | Status: AC
  Filled 2017-02-26: qty 10

## 2017-02-26 MED ORDER — MEPERIDINE HCL 25 MG/ML IJ SOLN: 6.2500 mg | INTRAMUSCULAR | Status: DC | PRN

## 2017-02-26 MED ORDER — ONDANSETRON HCL 4 MG/2ML IJ SOLN
4.0000 mg | Freq: Once | INTRAMUSCULAR | Status: DC | PRN
Start: 2017-02-26 — End: 2017-02-26

## 2017-02-26 MED ORDER — POLYETHYLENE GLYCOL 3350 17 G PO PACK
17.0000 g | PACK | Freq: Two times a day (BID) | ORAL | Status: DC
  Administered 2017-02-26 – 2017-02-27 (×3): 17 g via ORAL
  Filled 2017-02-26 (×3): qty 1

## 2017-02-26 MED ORDER — FENTANYL CITRATE (PF) 100 MCG/2ML IJ SOLN
INTRAMUSCULAR | Status: AC
  Filled 2017-02-26: qty 2

## 2017-02-26 MED ORDER — OXYCODONE HCL 5 MG PO TABS
ORAL_TABLET | ORAL | Status: AC
  Administered 2017-02-26: 10 mg via ORAL
  Filled 2017-02-26: qty 2

## 2017-02-26 MED ORDER — HYDROMORPHONE HCL 1 MG/ML IJ SOLN
INTRAMUSCULAR | Status: AC
  Administered 2017-02-26: 0.5 mg via INTRAVENOUS
  Filled 2017-02-26: qty 0.5

## 2017-02-26 MED ORDER — LEVOTHYROXINE SODIUM 100 MCG PO TABS
300.0000 ug | ORAL_TABLET | Freq: Every day | ORAL | Status: DC
  Administered 2017-02-27: 300 ug via ORAL
  Filled 2017-02-26: qty 3

## 2017-02-26 MED ORDER — DOCUSATE SODIUM 100 MG PO CAPS
100.0000 mg | ORAL_CAPSULE | Freq: Two times a day (BID) | ORAL | Status: DC
  Administered 2017-02-26 – 2017-02-27 (×3): 100 mg via ORAL
  Filled 2017-02-26 (×3): qty 1

## 2017-02-26 MED ORDER — VANCOMYCIN HCL IN DEXTROSE 1-5 GM/200ML-% IV SOLN
1000.0000 mg | Freq: Two times a day (BID) | INTRAVENOUS | Status: AC
  Administered 2017-02-26: 1000 mg via INTRAVENOUS
  Filled 2017-02-26: qty 200

## 2017-02-26 MED ORDER — SODIUM CHLORIDE 0.9 % IV SOLN
1000.0000 mg | INTRAVENOUS | Status: AC
  Administered 2017-02-26: 1000 mg via INTRAVENOUS
  Filled 2017-02-26: qty 10

## 2017-02-26 MED ORDER — NEBIVOLOL HCL 5 MG PO TABS
10.0000 mg | ORAL_TABLET | Freq: Every day | ORAL | Status: DC
  Administered 2017-02-27: 10 mg via ORAL
  Filled 2017-02-26: qty 2

## 2017-02-26 MED ORDER — ROSUVASTATIN CALCIUM 10 MG PO TABS
20.0000 mg | ORAL_TABLET | Freq: Every day | ORAL | Status: DC
  Administered 2017-02-26: 20 mg via ORAL
  Filled 2017-02-26: qty 2

## 2017-02-26 MED ORDER — METOCLOPRAMIDE HCL 5 MG/ML IJ SOLN: 5.0000 mg | Freq: Three times a day (TID) | INTRAMUSCULAR | Status: DC | PRN

## 2017-02-26 MED ORDER — FENTANYL CITRATE (PF) 100 MCG/2ML IJ SOLN
INTRAMUSCULAR | Status: DC | PRN
  Administered 2017-02-26 (×2): 50 ug via INTRAVENOUS

## 2017-02-26 MED ORDER — BUPIVACAINE-EPINEPHRINE 0.25% -1:200000 IJ SOLN
INTRAMUSCULAR | Status: DC | PRN
  Administered 2017-02-26: 30 mL

## 2017-02-26 MED ORDER — HYDROMORPHONE HCL 1 MG/ML IJ SOLN: 1.0000 mg | INTRAMUSCULAR | Status: DC | PRN

## 2017-02-26 MED ORDER — PHENYLEPHRINE HCL 10 MG/ML IJ SOLN
INTRAVENOUS | Status: DC | PRN
  Administered 2017-02-26: 25 ug/min via INTRAVENOUS

## 2017-02-26 MED ORDER — ONDANSETRON HCL 4 MG/2ML IJ SOLN
4.0000 mg | Freq: Four times a day (QID) | INTRAMUSCULAR | Status: DC | PRN
  Administered 2017-02-26: 4 mg via INTRAVENOUS
  Filled 2017-02-26: qty 2

## 2017-02-26 MED ORDER — CLOBETASOL PROPIONATE 0.05 % EX OINT
1.0000 "application " | TOPICAL_OINTMENT | Freq: Every day | CUTANEOUS | Status: DC | PRN
  Filled 2017-02-26: qty 15

## 2017-02-26 MED ORDER — ACETAMINOPHEN 325 MG PO TABS: 650.0000 mg | ORAL_TABLET | Freq: Four times a day (QID) | ORAL | Status: DC | PRN

## 2017-02-26 MED ORDER — POTASSIUM CHLORIDE IN NACL 20-0.9 MEQ/L-% IV SOLN
INTRAVENOUS | Status: DC
  Administered 2017-02-26: 16:00:00 via INTRAVENOUS
  Filled 2017-02-26: qty 1000

## 2017-02-26 MED ORDER — EPINEPHRINE PF 1 MG/ML IJ SOLN
INTRAMUSCULAR | Status: AC
  Filled 2017-02-26: qty 1

## 2017-02-26 MED ORDER — HYDROMORPHONE HCL 2 MG/ML IJ SOLN: 1.0000 mg | INTRAMUSCULAR | Status: DC | PRN

## 2017-02-26 MED ORDER — ACETAMINOPHEN 500 MG PO TABS
1000.0000 mg | ORAL_TABLET | Freq: Four times a day (QID) | ORAL | Status: AC
  Administered 2017-02-26 – 2017-02-27 (×4): 1000 mg via ORAL
  Filled 2017-02-26 (×4): qty 2

## 2017-02-26 MED ORDER — SODIUM CHLORIDE 0.9 % IR SOLN
Status: DC | PRN
  Administered 2017-02-26: 1000 mL
  Administered 2017-02-26: 3000 mL

## 2017-02-26 MED ORDER — DEXAMETHASONE SODIUM PHOSPHATE 10 MG/ML IJ SOLN
10.0000 mg | Freq: Three times a day (TID) | INTRAMUSCULAR | Status: DC
  Administered 2017-02-26 – 2017-02-27 (×3): 10 mg via INTRAVENOUS
  Filled 2017-02-26 (×3): qty 1

## 2017-02-26 MED ORDER — TOBRAMYCIN SULFATE 1.2 G IJ SOLR
INTRAMUSCULAR | Status: AC
  Filled 2017-02-26: qty 2.4

## 2017-02-26 MED ORDER — CETAPHIL MOISTURIZING EX LOTN
1.0000 "application " | TOPICAL_LOTION | Freq: Three times a day (TID) | CUTANEOUS | Status: DC | PRN
  Filled 2017-02-26: qty 118

## 2017-02-26 MED ORDER — DIPHENHYDRAMINE HCL 12.5 MG/5ML PO ELIX: 12.5000 mg | ORAL_SOLUTION | ORAL | Status: DC | PRN

## 2017-02-26 MED ORDER — OXYCODONE HCL 5 MG PO TABS
5.0000 mg | ORAL_TABLET | ORAL | Status: DC | PRN
  Administered 2017-02-26 (×3): 10 mg via ORAL
  Administered 2017-02-26: 5 mg via ORAL
  Administered 2017-02-27 (×5): 10 mg via ORAL
  Filled 2017-02-26 (×8): qty 2

## 2017-02-26 MED ORDER — HYDROMORPHONE HCL 1 MG/ML IJ SOLN
0.2500 mg | INTRAMUSCULAR | Status: DC | PRN
  Administered 2017-02-26: 0.5 mg via INTRAVENOUS

## 2017-02-26 MED ORDER — MIDAZOLAM HCL 2 MG/2ML IJ SOLN
INTRAMUSCULAR | Status: AC
  Filled 2017-02-26: qty 2

## 2017-02-26 MED ORDER — PROPOFOL 10 MG/ML IV BOLUS
INTRAVENOUS | Status: DC | PRN
  Administered 2017-02-26 (×2): 20 mg via INTRAVENOUS
  Administered 2017-02-26: 40 mg via INTRAVENOUS

## 2017-02-26 SURGICAL SUPPLY — 73 items
BANDAGE ESMARK 6X9 LF (GAUZE/BANDAGES/DRESSINGS) ×1 IMPLANT
BENZOIN TINCTURE PRP APPL 2/3 (GAUZE/BANDAGES/DRESSINGS) ×2 IMPLANT
BLADE SAGITTAL 25.0X1.19X90 (BLADE) ×2 IMPLANT
BLADE SAW SGTL 13X75X1.27 (BLADE) ×2 IMPLANT
BLADE SURG 10 STRL SS (BLADE) ×8 IMPLANT
BNDG ELASTIC 6X15 VLCR STRL LF (GAUZE/BANDAGES/DRESSINGS) ×2 IMPLANT
BNDG ESMARK 6X9 LF (GAUZE/BANDAGES/DRESSINGS) ×2
BOWL SMART MIX CTS (DISPOSABLE) ×2 IMPLANT
CAP KNEE TOTAL 3 SIGMA ×2 IMPLANT
CEFUROXIME 750 MG POWDER IMPLANT
CEMENT HV SMART SET (Cement) ×4 IMPLANT
COVER SURGICAL LIGHT HANDLE (MISCELLANEOUS) ×2 IMPLANT
CUFF TOURNIQUET SINGLE 34IN LL (TOURNIQUET CUFF) ×2 IMPLANT
CUFF TOURNIQUET SINGLE 44IN (TOURNIQUET CUFF) IMPLANT
DECANTER SPIKE VIAL GLASS SM (MISCELLANEOUS) IMPLANT
DEPUY CMW 1 BONE CEMENT IMPLANT
DRAPE EXTREMITY T 121X128X90 (DRAPE) ×2 IMPLANT
DRAPE HALF SHEET 40X57 (DRAPES) ×2 IMPLANT
DRAPE INCISE IOBAN 66X45 STRL (DRAPES) IMPLANT
DRAPE PROXIMA HALF (DRAPES) ×2 IMPLANT
DRAPE U-SHAPE 47X51 STRL (DRAPES) ×2 IMPLANT
DRSG AQUACEL AG ADV 3.5X10 (GAUZE/BANDAGES/DRESSINGS) ×2 IMPLANT
DRSG AQUACEL AG ADV 3.5X14 (GAUZE/BANDAGES/DRESSINGS) ×2 IMPLANT
DURAPREP 26ML APPLICATOR (WOUND CARE) ×4 IMPLANT
ELECT CAUTERY BLADE 6.4 (BLADE) ×2 IMPLANT
ELECT REM PT RETURN 9FT ADLT (ELECTROSURGICAL) ×2
ELECTRODE REM PT RTRN 9FT ADLT (ELECTROSURGICAL) ×1 IMPLANT
FACESHIELD WRAPAROUND (MASK) IMPLANT
GLOVE BIO SURGEON STRL SZ7 (GLOVE) ×2 IMPLANT
GLOVE BIOGEL PI IND STRL 7.0 (GLOVE) ×1 IMPLANT
GLOVE BIOGEL PI IND STRL 7.5 (GLOVE) ×1 IMPLANT
GLOVE BIOGEL PI INDICATOR 7.0 (GLOVE) ×1
GLOVE BIOGEL PI INDICATOR 7.5 (GLOVE) ×1
GLOVE SS BIOGEL STRL SZ 7.5 (GLOVE) ×1 IMPLANT
GLOVE SUPERSENSE BIOGEL SZ 7.5 (GLOVE) ×1
GOWN STRL REUS W/ TWL LRG LVL3 (GOWN DISPOSABLE) ×4 IMPLANT
GOWN STRL REUS W/ TWL XL LVL3 (GOWN DISPOSABLE) ×2 IMPLANT
GOWN STRL REUS W/TWL LRG LVL3 (GOWN DISPOSABLE) ×4
GOWN STRL REUS W/TWL XL LVL3 (GOWN DISPOSABLE) ×2
HANDPIECE INTERPULSE COAX TIP (DISPOSABLE) ×1
HOOD PEEL AWAY FACE SHEILD DIS (HOOD) ×6 IMPLANT
IMMOBILIZER KNEE 22 UNIV (SOFTGOODS) IMPLANT
IMMOBILIZER KNEE 24 THIGH 36 (MISCELLANEOUS) ×1 IMPLANT
IMMOBILIZER KNEE 24 UNIV (MISCELLANEOUS) ×2
KIT BASIN OR (CUSTOM PROCEDURE TRAY) ×2 IMPLANT
KIT ROOM TURNOVER OR (KITS) ×2 IMPLANT
MANIFOLD NEPTUNE II (INSTRUMENTS) ×2 IMPLANT
MARKER SKIN DUAL TIP RULER LAB (MISCELLANEOUS) ×2 IMPLANT
NEEDLE 18GX1X1/2 (RX/OR ONLY) (NEEDLE) ×2 IMPLANT
NS IRRIG 1000ML POUR BTL (IV SOLUTION) ×2 IMPLANT
PACK TOTAL JOINT (CUSTOM PROCEDURE TRAY) ×2 IMPLANT
PAD ARMBOARD 7.5X6 YLW CONV (MISCELLANEOUS) ×4 IMPLANT
SET HNDPC FAN SPRY TIP SCT (DISPOSABLE) ×1 IMPLANT
SMART SET HV BONE CEMENT IMPLANT
STRIP CLOSURE SKIN 1/2X4 (GAUZE/BANDAGES/DRESSINGS) ×2 IMPLANT
SUCTION FRAZIER HANDLE 10FR (MISCELLANEOUS)
SUCTION TUBE FRAZIER 10FR DISP (MISCELLANEOUS) IMPLANT
SUT FIBERWIRE 2-0 18 17.9 3/8 (SUTURE) ×2
SUT MNCRL AB 3-0 PS2 18 (SUTURE) ×4 IMPLANT
SUT VIC AB 0 CT1 27 (SUTURE) ×2
SUT VIC AB 0 CT1 27XBRD ANBCTR (SUTURE) ×2 IMPLANT
SUT VIC AB 1 CT1 27 (SUTURE) ×2
SUT VIC AB 1 CT1 27XBRD ANBCTR (SUTURE) ×2 IMPLANT
SUT VIC AB 2-0 CT1 27 (SUTURE) ×2
SUT VIC AB 2-0 CT1 TAPERPNT 27 (SUTURE) ×2 IMPLANT
SUTURE FIBERWR 2-0 18 17.9 3/8 (SUTURE) ×1 IMPLANT
SYR 30ML LL (SYRINGE) ×2 IMPLANT
TOWEL OR 17X24 6PK STRL BLUE (TOWEL DISPOSABLE) ×2 IMPLANT
TOWEL OR 17X26 10 PK STRL BLUE (TOWEL DISPOSABLE) ×2 IMPLANT
TRAY CATH 16FR W/PLASTIC CATH (SET/KITS/TRAYS/PACK) IMPLANT
TRAY FOLEY CATH 16FR SILVER (SET/KITS/TRAYS/PACK) ×2 IMPLANT
TUBE CONNECTING 12X1/4 (SUCTIONS) ×2 IMPLANT
YANKAUER SUCT BULB TIP NO VENT (SUCTIONS) ×4 IMPLANT

## 2017-02-26 NOTE — Anesthesia Procedure Notes (Signed)
Spinal  Start time: 02/26/2017 8:30 AM End time: 02/26/2017 8:03 AM Staffing Anesthesiologist: Arta Bruce Performed: anesthesiologist  Preanesthetic Checklist Completed: patient identified, surgical consent, pre-op evaluation, timeout performed, IV checked, risks and benefits discussed and monitors and equipment checked Spinal Block Patient position: sitting Prep: DuraPrep Patient monitoring: heart rate, cardiac monitor, continuous pulse ox and blood pressure Approach: right paramedian Location: L3-4 Injection technique: single-shot Needle Needle type: Pencan  Needle gauge: 24 G Needle length: 9 cm Needle insertion depth: 8 cm

## 2017-02-26 NOTE — Evaluation (Signed)
Physical Therapy Evaluation Patient Details Name: Bryan [name] MRN: [id number] DOB: [birth year] Today's Date: 02/26/2017   History of Present Illness  51 y.o. male s/p Rt TKA. PMH: HTN, bells palsy.   Clinical Impression  Pt is s/p rt TKA resulting in the deficits listed below (see PT Problem List). Pt ambulating 15 ft with rw during initial PT session. Anticipating that the pt will D/C to home with family support following acute stay.  Pt will benefit from skilled PT to increase their independence and safety with mobility.     Follow Up Recommendations Home health PT;Supervision for mobility/OOB    Equipment Recommendations  Rolling walker with 5" wheels (bariatric)    Recommendations for Other Services       Precautions / Restrictions Precautions Precautions: Knee Precaution Booklet Issued: Yes (comment) Precaution Comments: HEP provided, reviewed knee extension precautions Required Braces or Orthoses: Knee Immobilizer - Right Knee Immobilizer - Right: Discontinue once straight leg raise with < 10 degree lag Restrictions Weight Bearing Restrictions: Yes RLE Weight Bearing: Weight bearing as tolerated      Mobility  Bed Mobility Overal bed mobility: Needs Assistance Bed Mobility: Supine to Sit     Supine to sit: Min guard;HOB elevated     General bed mobility comments: min guard for safety  Transfers Overall transfer level: Needs assistance Equipment used: Rolling walker (2 wheeled) Transfers: Sit to/from Stand Sit to Stand: Min guard         General transfer comment: cues for hand placement  Ambulation/Gait Ambulation/Gait assistance: Min guard Ambulation Distance (Feet): 15 Feet Assistive device: Rolling walker (2 wheeled) Gait Pattern/deviations: Step-to pattern;Decreased weight shift to right Gait velocity: decreased   General Gait Details: reinforcing WBAT  Stairs            Wheelchair Mobility    Modified Rankin (Stroke Patients  Only)       Balance Overall balance assessment: Needs assistance Sitting-balance support: No upper extremity supported Sitting balance-Leahy Scale: Good     Standing balance support: Bilateral upper extremity supported Standing balance-Leahy Scale: Poor Standing balance comment: using rw                             Pertinent Vitals/Pain Pain Assessment: 0-10 Pain Score: 7  Pain Location: Rt knee Pain Descriptors / Indicators: Aching Pain Intervention(s): Limited activity within patient's tolerance;Monitored during session    Home Living Family/patient expects to be discharged to:: Private residence Living Arrangements: Spouse/significant other Available Help at Discharge: Family;Available 24 hours/day Type of Home: House Home Access: Stairs to enter Entrance Stairs-Rails: None Entrance Stairs-Number of Steps: 4 Home Layout: One level Home Equipment: Crutches      Prior Function Level of Independence: Independent               Hand Dominance        Extremity/Trunk Assessment   Upper Extremity Assessment Upper Extremity Assessment: Overall WFL for tasks assessed    Lower Extremity Assessment Lower Extremity Assessment: RLE deficits/detail RLE Deficits / Details: fair quad activation, able to perform SLR with significant lag       Communication   Communication: No difficulties  Cognition Arousal/Alertness: Awake/alert Behavior During Therapy: WFL for tasks assessed/performed Overall Cognitive Status: Within Functional Limits for tasks assessed                      General Comments      Exercises Total  Joint Exercises Ankle Circles/Pumps: AROM;Both;10 reps Quad Sets: Strengthening;Right;10 reps   Assessment/Plan    PT Assessment Patient needs continued PT services  PT Problem List Decreased strength;Decreased range of motion;Decreased activity tolerance;Decreased balance;Decreased mobility       PT Treatment  Interventions DME instruction;Gait training;Stair training;Functional mobility training;Therapeutic exercise;Therapeutic activities;Patient/family education    PT Goals (Current goals can be found in the Care Plan section)  Acute Rehab PT Goals Patient Stated Goal: less pain PT Goal Formulation: With patient Time For Goal Achievement: 03/12/17 Potential to Achieve Goals: Good    Frequency 7X/week   Barriers to discharge        Co-evaluation               End of Session Equipment Utilized During Treatment: Gait belt;Right knee immobilizer Activity Tolerance: Patient tolerated treatment well Patient left: in chair;with call bell/phone within reach (in knee extension) Nurse Communication: Mobility status;Weight bearing status PT Visit Diagnosis: Muscle weakness (generalized) (M62.81);Difficulty in walking, not elsewhere classified (R26.2)         Time: 4098-1191 PT Time Calculation (min) (ACUTE ONLY): 31 min   Charges:   PT Evaluation $PT Eval Moderate Complexity: 1 Procedure PT Treatments $Gait Training: 8-22 mins   PT G Codes:         Christiane Ha, PT, CSCS Pager [phone] Office [phone]  02/26/2017, 4:05 PM

## 2017-02-26 NOTE — Interval H&P Note (Signed)
.  H&P done

## 2017-02-26 NOTE — Anesthesia Procedure Notes (Signed)
Procedure Name: MAC Date/Time: 02/26/2017 8:45 AM Performed by: Everlean Cherry A Pre-anesthesia Checklist: Patient identified, Emergency Drugs available, Suction available and Patient being monitored Patient Re-evaluated:Patient Re-evaluated prior to inductionOxygen Delivery Method: Simple face mask

## 2017-02-26 NOTE — Progress Notes (Signed)
Orthopedic Tech Progress Note Patient Details:  Bryan [name] 1966/07/01 [id number]  CPM Right Knee CPM Right Knee: On Right Knee Flexion (Degrees): 90 Right Knee Extension (Degrees): 0   Resa Rinks 02/26/2017, 11:43 AM Pt exceeds weight limit for trapeze bar patient helper; RN notified

## 2017-02-26 NOTE — Anesthesia Postprocedure Evaluation (Signed)
Anesthesia Post Note  Patient: Bryan [name]  Procedure(s) Performed: Procedure(s) (LRB): TOTAL KNEE ARTHROPLASTY (Right)  Patient location during evaluation: PACU Anesthesia Type: Spinal Level of consciousness: oriented and awake and alert Pain management: pain level controlled Vital Signs Assessment: post-procedure vital signs reviewed and stable Respiratory status: spontaneous breathing, respiratory function stable and patient connected to nasal cannula oxygen Cardiovascular status: blood pressure returned to baseline and stable Postop Assessment: no headache and no backache Anesthetic complications: no       Last Vitals:  Vitals:   02/26/17 1410 02/26/17 1459  BP: (!) 138/95 (!) 155/81  Pulse: 73 70  Resp: 18 18  Temp: 36.4 C 36.6 C    Last Pain:  Vitals:   02/26/17 1459  TempSrc: Oral  PainSc:                  [name]

## 2017-02-26 NOTE — H&P (View-Only) (Signed)
UNI KNEE ADMISSION H&P  Patient is being admitted for right total knee arthroplasty.  Subjective:  Chief Complaint:right knee pain.  HPI: Bryan [name], 51 y.o. male, has a history of pain and functional disability in the right knee due to arthritis and has failed non-surgical conservative treatments for greater than 12 weeks to includeNSAID's and/or analgesics, corticosteriod injections, viscosupplementation injections, flexibility and strengthening excercises, use of assistive devices, weight reduction as appropriate and activity modification.  Onset of symptoms was gradual, starting 10 years ago with gradually worsening course since that time. The patient noted prior procedures on the knee to include  arthroscopy and menisectomy on the right knee(s).  Patient currently rates pain in the right knee(s) at 10 out of 10 with activity. Patient has night pain, worsening of pain with activity and weight bearing, pain that interferes with activities of daily living, crepitus and joint swelling.  Patient has evidence of subchondral sclerosis, periarticular osteophytes and joint space narrowing by imaging studies.  There is no active infection.  Patient Active Problem List   Diagnosis Date Noted  . Primary localized osteoarthritis of right knee   . OSA (obstructive sleep apnea)   . Hypothyroidism   . Hypertension   . Vitamin D deficiency    Past Medical History:  Diagnosis Date  . Bell's palsy   . Elevated cholesterol   . Hypertension   . Hypothyroidism   . OSA (obstructive sleep apnea)    uses CPAP  . Primary localized osteoarthritis of right knee   . Vitamin D deficiency     No past surgical history on file.  No prescriptions prior to admission.   Allergies  Allergen Reactions  . Lipitor [Atorvastatin Calcium] Other (See Comments)    Muscle aches.  . Penicillins Hives and Swelling    Has patient had a PCN reaction causing immediate rash, facial/tongue/throat swelling, SOB or  lightheadedness with hypotension:unsure Has patient had a PCN reaction causing severe rash involving mucus membranes or skin necrosis:unsure Has patient had a PCN reaction that required hospitalization:No Has patient had a PCN reaction occurring within the last 10 years:No If all of the above answers are "NO", then may proceed with Cephalosporin use.     Social History  Substance Use Topics  . Smoking status: Never Smoker  . Smokeless tobacco: Not on file  . Alcohol use 0.0 oz/week     Comment: 1-2 daily beer    Family History  Problem Relation Age of Onset  . Heart attack Father     stent x 2  . Atrial fibrillation Father   . Deep vein thrombosis Mother   . Rheum arthritis Mother   . Pulmonary embolism    . Hypothyroidism       Review of Systems  Constitutional: Negative.   HENT: Negative.   Eyes: Negative.   Respiratory: Negative.   Cardiovascular: Negative.   Gastrointestinal: Negative.   Genitourinary: Negative.   Musculoskeletal: Positive for back pain and joint pain.  Skin: Negative.   Neurological: Negative.   Endo/Heme/Allergies: Negative.   Psychiatric/Behavioral: Negative.     Objective:  Physical Exam  Constitutional: He is oriented to person, place, and time. He appears well-developed and well-nourished.  HENT:  Head: Normocephalic and atraumatic.  Eyes: Conjunctivae are normal. Pupils are equal, round, and reactive to light.  Neck: Neck supple.  Cardiovascular: Normal rate and regular rhythm.   Respiratory: Effort normal.  GI: Soft.  Genitourinary:  Genitourinary Comments: Not pertinent to current symptomatology therefore not  examined.  Musculoskeletal:   Examination of both knees reveals pain medially bilaterally.  1+ crepitation.  1+ synovitis.  Mild varus deformity.  Range of motion 0-120 degrees.  Knees are stable with normal patella tracking.  Vascular exam: Pulses are 2+ and symmetric.  Neurologic exam: Distal motor and sensory examination is  within normal limits.    Neurological: He is alert and oriented to person, place, and time.  Skin: Skin is warm and dry.  Psychiatric: He has a normal mood and affect. His behavior is normal.    Vital signs in last 24 hours: Temp:  [97.4 F (36.3 C)] 97.4 F (36.3 C) (02/14 1500) Pulse Rate:  [68] 68 (02/14 1500) BP: (156)/(80) 156/80 (02/14 1500) SpO2:  [96 %] 96 % (02/14 1500) Weight:  [145.6 kg (321 lb)] 145.6 kg (321 lb) (02/14 1500)  Labs:   Estimated body mass index is 41.21 kg/m as calculated from the following:   Height as of this encounter: 6\' 2"  (1.88 m).   Weight as of this encounter: 145.6 kg (321 lb).   Imaging Review Plain radiographs demonstrate severe degenerative joint disease of the right knee(s). The overall alignment issignificant varus. The bone quality appears to be good for age and reported activity level.  Assessment/Plan:  End stage arthritis, right knee  Principal Problem:   Primary localized osteoarthritis of right knee Active Problems:   OSA (obstructive sleep apnea)   Hypothyroidism   Hypertension   Vitamin D deficiency   The patient history, physical examination, clinical judgment of the provider and imaging studies are consistent with end stage degenerative joint disease of the right knee(s) and total knee arthroplasty is deemed medically necessary. The treatment options including medical management, injection therapy arthroscopy and arthroplasty were discussed at length. The risks and benefits of total knee arthroplasty were presented and reviewed. The risks due to aseptic loosening, infection, stiffness, patella tracking problems, thromboembolic complications and other imponderables were discussed. The patient acknowledged the explanation, agreed to proceed with the plan and consent was signed. Patient is being admitted for inpatient treatment for surgery, pain control, PT, OT, prophylactic antibiotics, VTE prophylaxis, progressive ambulation  and ADL's and discharge planning. The patient is planning to be discharged home with home health services

## 2017-02-26 NOTE — Anesthesia Procedure Notes (Signed)
Anesthesia Regional Block: Adductor canal block   Pre-Anesthetic Checklist: ,, timeout performed, Correct Patient, Correct Site, Correct Laterality, Correct Procedure, Correct Position, site marked, Risks and benefits discussed,  Surgical consent,  Pre-op evaluation,  At surgeon's request and post-op pain management  Laterality: Right  Prep: chloraprep       Needles:  Injection technique: Single-shot  Needle Type: Echogenic Stimulator Needle     Needle Length: 9cm  Needle Gauge: 21     Additional Needles:   Procedures: ultrasound guided,,,,,,,,  Narrative:  Start time: 02/26/2017 8:15 AM End time: 02/26/2017 8:25 AM Injection made incrementally with aspirations every 5 mL.  Performed by: Personally  Anesthesiologist: Arta Bruce  Additional Notes: Monitors applied. Patient sedated. Sterile prep and drape,hand hygiene and sterile gloves were used. Relevant anatomy identified.Needle position confirmed.Local anesthetic injected incrementally after negative aspiration. Local anesthetic spread visualized around nerve(s). Vascular puncture avoided. No complications. Image printed for medical record.The patient tolerated the procedure well.    Arta Bruce MD

## 2017-02-26 NOTE — Op Note (Signed)
MRN:     [id number] DOB/AGE:    1966/07/25 / 51 y.o.       OPERATIVE REPORT    DATE OF PROCEDURE:  02/26/2017       PREOPERATIVE DIAGNOSIS:   Primary localized OA right knee      Estimated body mass index is 40.58 kg/m as calculated from the following:   Height as of 02/18/17: 6\' 2"  (1.88 m).   Weight as of 02/18/17: 316 lb 1.6 oz (143.4 kg).                                                        POSTOPERATIVE DIAGNOSIS:   same                                                                      PROCEDURE:  Procedure(s): TOTAL KNEE ARTHROPLASTY Using Depuy Sigma RP implants #5 Femur, #5Tibia, 12.23mm  RP bearing, 35 Patella     SURGEON: Caitrin Pendergraph A    ASSISTANT:  Kirstin Shepperson PA-C   (Present and scrubbed throughout the case, critical for assistance with exposure, retraction, instrumentation, and closure.)         ANESTHESIA: Spinal with Adductor Nerve Block     TOURNIQUET TIME:   COMPLICATIONS:  None     SPECIMENS: None   INDICATIONS FOR PROCEDURE: The patient has  djd right knee, varus deformities, XR shows bone on bone arthritis. Patient has failed all conservative measures including anti-inflammatory medicines, narcotics, attempts at  exercise and weight loss, cortisone injections and viscosupplementation.  Risks and benefits of surgery have been discussed, questions answered.   DESCRIPTION OF PROCEDURE: The patient identified by armband, received  right femoral nerve block and IV antibiotics, in the holding area at [facility]. Patient taken to the operating room, appropriate anesthetic  monitors were attached Spinal induced with  the patient in supine position, Foley catheter was inserted. Tourniquet  applied high to the operative thigh. Lateral post and foot positioner  applied to the table, the lower extremity was then prepped and draped  in usual sterile fashion from the ankle to the tourniquet. Time-out procedure was performed. The limb was wrapped  with an Esmarch bandage and the tourniquet inflated to 365 mmHg. We began the operation by making the anterior midline incision starting at handbreadth above the patella going over the patella 1 cm medial to and  4 cm distal to the tibial tubercle. Small bleeders in the skin and the  subcutaneous tissue identified and cauterized. Transverse retinaculum was incised and reflected medially and a medial parapatellar arthrotomy was accomplished. the patella was everted and theprepatellar fat pad resected. The superficial medial collateral  ligament was then elevated from anterior to posterior along the proximal  flare of the tibia and anterior half of the menisci resected. The knee was hyperflexed exposing bone on bone arthritis. Peripheral and notch osteophytes as well as the cruciate ligaments were then resected. We continued to  work our way around posteriorly along the proximal tibia, and externally  rotated the tibia subluxing it out from  underneath the femur. A McHale  retractor was placed through the notch and a lateral Hohmann retractor  placed, and we then drilled through the proximal tibia in line with the  axis of the tibia followed by an intramedullary guide rod and 2-degree  posterior slope cutting guide. The tibial cutting guide was pinned into place  allowing resection of 4 mm of bone medially and about 6 mm of bone  laterally because of her varus deformity. Satisfied with the tibial resection, we then  entered the distal femur 2 mm anterior to the PCL origin with the  intramedullary guide rod and applied the distal femoral cutting guide  set at 65mm, with 5 degrees of valgus. This was pinned along the  epicondylar axis. At this point, the distal femoral cut was accomplished without difficulty. We then sized for a #5 femoral component and pinned the guide in 3 degrees of external rotation.The chamfer cutting guide was pinned into place. The anterior, posterior, and chamfer cuts were  accomplished without difficulty followed by  the  RP box cutting guide and the box cut. We also removed posterior osteophytes from the posterior femoral condyles. At this  time, the knee was brought into full extension. We checked our  extension and flexion gaps and found them symmetric at 12.51mm.  The patella thickness measured at 25 mm. We set the cutting guide at 15 and removed the posterior 9.5-10 mm  of the patella sized for 35 button and drilled the lollipop. The knee  was then once again hyperflexed exposing the proximal tibia. We sized for a #5 tibial base plate, applied the smokestack and the conical reamer followed by the the Delta fin keel punch. We then hammered into place the  RP trial femoral component, inserted a 1 trial bearing, trial patellar button, and took the knee through range of motion from 0-130 degrees. No thumb pressure was required for patellar  tracking. At this point, all trial components were removed, a double batch of DePuy HV cement with 1500 mg of tobra was mixed and applied to all bony metallic mating surfaces except for the posterior condyles of the femur itself. In order, we  hammered into place the tibial tray and removed excess cement, the femoral component and removed excess cement, a 12.28mm  RP bearing  was inserted, and the knee brought to full extension with compression.  The patellar button was clamped into place, and excess cement  removed. While the cement cured the wound was irrigated out with normal saline solution pulse lavage.. Ligament stability and patellar tracking were checked and found to be excellent.. The parapatellar arthrotomy was closed with  #1 Vicryl suture. The subcutaneous tissue with 0 and 2-0 undyed  Vicryl suture, and 4-0 Monocryl.. A dressing of Aquaseal,  4 x 4, dressing sponges, Webril, and Ace wrap applied. Needle and sponge count were correct times 2.The patient awakened, extubated, and taken to recovery room without difficulty.  Vascular status was normal, pulses 2+ and symmetric.   Bryan [name] A 02/26/2017, 10:33 AM

## 2017-02-26 NOTE — Anesthesia Preprocedure Evaluation (Signed)
Anesthesia Evaluation  Patient identified by MRN, date of birth, ID band Patient awake    Reviewed: Allergy & Precautions, NPO status , Patient's Chart, lab work & pertinent test results  Airway Mallampati: II  TM Distance: >3 FB Neck ROM: Full    Dental   Pulmonary sleep apnea ,    Pulmonary exam normal        Cardiovascular hypertension, Pt. on medications Normal cardiovascular exam     Neuro/Psych    GI/Hepatic   Endo/Other    Renal/GU      Musculoskeletal   Abdominal   Peds  Hematology   Anesthesia Other Findings   Reproductive/Obstetrics                             Anesthesia Physical Anesthesia Plan  ASA: III  Anesthesia Plan: Spinal and MAC   Post-op Pain Management:  Regional for Post-op pain   Induction: Intravenous  Airway Management Planned: Simple Face Mask  Additional Equipment:   Intra-op Plan:   Post-operative Plan:   Informed Consent: I have reviewed the patients History and Physical, chart, labs and discussed the procedure including the risks, benefits and alternatives for the proposed anesthesia with the patient or authorized representative who has indicated his/her understanding and acceptance.     Plan Discussed with: CRNA and Surgeon  Anesthesia Plan Comments:         Anesthesia Quick Evaluation

## 2017-02-26 NOTE — Transfer of Care (Signed)
Immediate Anesthesia Transfer of Care Note  Patient: Bryan [name]  Procedure(s) Performed: Procedure(s): TOTAL KNEE ARTHROPLASTY (Right)  Patient Location: PACU  Anesthesia Type:MAC combined with regional for post-op pain  Level of Consciousness: awake, alert , oriented and patient cooperative  Airway & Oxygen Therapy: Patient Spontanous Breathing and Patient connected to nasal cannula oxygen  Post-op Assessment: Report given to RN and Post -op Vital signs reviewed and stable  Post vital signs: Reviewed and stable  Last Vitals:  Vitals:   02/26/17 0645  BP: 134/71  Pulse: 78  Resp: (!) 24  Temp: 36.7 C    Last Pain:  Vitals:   02/26/17 0645  TempSrc: Oral         Complications: No apparent anesthesia complications

## 2017-02-26 NOTE — Progress Notes (Signed)
Patient has home CPAP machine with mask and tubing. CPAP was set up and water chamber filled. Patient states he can place self on tonight.

## 2017-02-27 ENCOUNTER — Encounter (HOSPITAL_COMMUNITY): Payer: Self-pay | Admitting: Orthopedic Surgery

## 2017-02-27 DIAGNOSIS — Z79899 Other long term (current) drug therapy: Secondary | ICD-10-CM | POA: Diagnosis not present

## 2017-02-27 DIAGNOSIS — E7800 Pure hypercholesterolemia, unspecified: Secondary | ICD-10-CM | POA: Diagnosis not present

## 2017-02-27 DIAGNOSIS — Z9989 Dependence on other enabling machines and devices: Secondary | ICD-10-CM | POA: Diagnosis not present

## 2017-02-27 DIAGNOSIS — E039 Hypothyroidism, unspecified: Secondary | ICD-10-CM | POA: Diagnosis not present

## 2017-02-27 DIAGNOSIS — G4733 Obstructive sleep apnea (adult) (pediatric): Secondary | ICD-10-CM | POA: Diagnosis not present

## 2017-02-27 DIAGNOSIS — I10 Essential (primary) hypertension: Secondary | ICD-10-CM | POA: Diagnosis not present

## 2017-02-27 DIAGNOSIS — M1711 Unilateral primary osteoarthritis, right knee: Secondary | ICD-10-CM | POA: Diagnosis not present

## 2017-02-27 LAB — BASIC METABOLIC PANEL
ANION GAP: 8 (ref 5–15)
BUN: 10 mg/dL (ref 6–20)
CALCIUM: 8.7 mg/dL — AB (ref 8.9–10.3)
CO2: 23 mmol/L (ref 22–32)
CREATININE: 0.84 mg/dL (ref 0.61–1.24)
Chloride: 106 mmol/L (ref 101–111)
GFR calc Af Amer: >60 mL/min (ref >60)
GLUCOSE: 164 mg/dL — AB (ref 65–99)
Potassium: 4 mmol/L (ref 3.5–5.1)
Sodium: 137 mmol/L (ref 135–145)

## 2017-02-27 LAB — CBC
HEMATOCRIT: 40.4 % (ref 39.0–52.0)
Hemoglobin: 14.1 g/dL (ref 13.0–17.0)
MCH: 31.8 pg (ref 26.0–34.0)
MCHC: 34.9 g/dL (ref 30.0–36.0)
MCV: 91 fL (ref 78.0–100.0)
PLATELETS: 240 10*3/uL (ref 150–400)
RBC: 4.44 MIL/uL (ref 4.22–5.81)
RDW: 13.3 % (ref 11.5–15.5)
WBC: 14.3 10*3/uL — AB (ref 4.0–10.5)

## 2017-02-27 MED ORDER — METHOCARBAMOL 500 MG PO TABS: 500.0000 mg | ORAL_TABLET | Freq: Four times a day (QID) | ORAL | 0 refills | Status: DC

## 2017-02-27 MED ORDER — ASPIRIN EC 325 MG PO TBEC: 325.0000 mg | DELAYED_RELEASE_TABLET | Freq: Every day | ORAL | 0 refills | Status: DC

## 2017-02-27 MED ORDER — ONDANSETRON HCL 4 MG PO TABS: 4.0000 mg | ORAL_TABLET | Freq: Three times a day (TID) | ORAL | 0 refills | Status: DC | PRN

## 2017-02-27 MED ORDER — OXYCODONE HCL 5 MG PO TABS: ORAL_TABLET | ORAL | 0 refills | Status: DC

## 2017-02-27 NOTE — Progress Notes (Signed)
Physical Therapy Treatment Patient Details Name: Bryan [name] MRN: [id number] DOB: Oct 31, 1966 Today's Date: 02/27/2017    History of Present Illness 51 y.o. male s/p Rt TKA. PMH: HTN, bells palsy.     PT Comments    Patient is progressing well toward mobility goals. Patient needs to practice stairs next session.    Follow Up Recommendations  Home health PT;Supervision for mobility/OOB     Equipment Recommendations  Rolling walker with 5" wheels (bariatric)    Recommendations for Other Services       Precautions / Restrictions Precautions Precautions: Knee Precaution Booklet Issued: Yes (comment) Precaution Comments: HEP provided, reviewed knee extension precautions Restrictions Weight Bearing Restrictions: Yes RLE Weight Bearing: Weight bearing as tolerated    Mobility  Bed Mobility Overal bed mobility: Needs Assistance Bed Mobility: Supine to Sit     Supine to sit: Min guard;HOB elevated     General bed mobility comments: min guard for safety  Transfers Overall transfer level: Needs assistance Equipment used: Rolling walker (2 wheeled) Transfers: Sit to/from Stand Sit to Stand: Supervision         General transfer comment: supervision for safety; cues for safe hand placement  Ambulation/Gait Ambulation/Gait assistance: Supervision Ambulation Distance (Feet): 250 Feet Assistive device: Rolling walker (2 wheeled) Gait Pattern/deviations: Decreased weight shift to right;Step-through pattern;Decreased stride length Gait velocity: decreased   General Gait Details: reinforcing WBAT; good step through pattern   Stairs            Wheelchair Mobility    Modified Rankin (Stroke Patients Only)       Balance Overall balance assessment: Needs assistance Sitting-balance support: No upper extremity supported Sitting balance-Leahy Scale: Good     Standing balance support: Bilateral upper extremity supported Standing balance-Leahy Scale:  Poor Standing balance comment: using rw                    Cognition Arousal/Alertness: Awake/alert Behavior During Therapy: WFL for tasks assessed/performed Overall Cognitive Status: Within Functional Limits for tasks assessed                      Exercises Total Joint Exercises Quad Sets: Strengthening;Right;10 reps Towel Squeeze: AROM;10 reps Short Arc Quad: AROM;Right;10 reps Heel Slides: AROM;Right;10 reps Hip ABduction/ADduction: AROM;Right;10 reps Straight Leg Raises: AROM;Right;10 reps Goniometric ROM: 5-90    General Comments        Pertinent Vitals/Pain Pain Assessment: Faces Pain Score: 3  Faces Pain Scale: Hurts little more Pain Location: Rt knee Pain Descriptors / Indicators: Sore;Grimacing Pain Intervention(s): Monitored during session;Premedicated before session;Repositioned    Home Living Family/patient expects to be discharged to:: Private residence Living Arrangements: Spouse/significant other Available Help at Discharge: Family;Available 24 hours/day Type of Home: House       Home Equipment: Bedside commode      Prior Function Level of Independence: Independent          PT Goals (current goals can now be found in the care plan section) Acute Rehab PT Goals Patient Stated Goal: less pain PT Goal Formulation: With patient Time For Goal Achievement: 03/12/17 Potential to Achieve Goals: Good Progress towards PT goals: Progressing toward goals    Frequency    7X/week      PT Plan Current plan remains appropriate    Co-evaluation             End of Session Equipment Utilized During Treatment: Gait belt Activity Tolerance: Patient tolerated treatment well Patient left:  in chair;with call bell/phone within reach Nurse Communication: Mobility status;Weight bearing status PT Visit Diagnosis: Muscle weakness (generalized) (M62.81);Difficulty in walking, not elsewhere classified (R26.2)     Time: 9147-8295 PT Time  Calculation (min) (ACUTE ONLY): 36 min  Charges:  $Gait Training: 8-22 mins $Therapeutic Exercise: 8-22 mins                    G Codes:       Derek Mound, PTA Pager: [phone]   02/27/2017, 11:40 AM

## 2017-02-27 NOTE — Progress Notes (Signed)
Orthopedic Tech Progress Note Patient Details:  Bryan [name] 31-Jul-1966 [id number]  CPM Right Knee CPM Right Knee: On Right Knee Flexion (Degrees): 60 Right Knee Extension (Degrees): 0   [name] 02/27/2017, 1:54 PM

## 2017-02-27 NOTE — Evaluation (Signed)
Occupational Therapy Evaluation Patient Details Name: Bryan [name] MRN: [id number] DOB: Jun 05, 1966 Today's Date: 02/27/2017    History of Present Illness R TKA, PMH;: HTN, BELLS PALSY.   Clinical Impression   Patient and wife were educated on use of AE for LE ADLs. Pt. Was able to return demo at [place]. Pt. And wife were instructed that they could purchase the hip kit. Pt. Was educarted on technique for sit to stand and stand to sit. pnt. Was educated on use of 3-1 commode for toileting and for shower seat. Pt. Would benefit from [facility] to address needs in the home. Pt. Plans to d/c today therefore no further acute ot needed.     Follow Up Recommendations  Home health OT    Equipment Recommendations  None recommended by OT    Recommendations for Other Services       Precautions / Restrictions Precautions Precautions: Knee Restrictions Weight Bearing Restrictions: Yes RLE Weight Bearing: Weight bearing as tolerated      Mobility Bed Mobility                  Transfers       Sit to Stand: Supervision         General transfer comment: cues for hand placement and to kick out R LE for pressure relief.     Balance                                            ADL Overall ADL's : Needs assistance/impaired         Upper Body Bathing: Set up   Lower Body Bathing: Supervison/ safety;Set up;With adaptive equipment   Upper Body Dressing : Set up   Lower Body Dressing: Minimal assistance;With adaptive equipment   Toilet Transfer: Supervision/safety;Ambulation   Toileting- Clothing Manipulation and Hygiene: Supervision/safety   Tub/ Shower Transfer: Walk-in shower;Min guard   Functional mobility during ADLs: Supervision/safety General ADL Comments: Patientt educated on use of hip kit. Pt. states that he has had difficulty with donning of socks and shoes for a while.      Vision Baseline Vision/History: Wears glasses Wears  Glasses: At all times Patient Visual Report: No change from baseline       Perception     Praxis      Pertinent Vitals/Pain Pain Assessment: 0-10 Pain Score: 3  Pain Location: R KNEE Pain Descriptors / Indicators: Aching Pain Intervention(s): Limited activity within patient's tolerance     Hand Dominance     Extremity/Trunk Assessment Upper Extremity Assessment Upper Extremity Assessment: Overall WFL for tasks assessed           Communication Communication Communication: No difficulties   Cognition Arousal/Alertness: Awake/alert Behavior During Therapy: WFL for tasks assessed/performed Overall Cognitive Status: Within Functional Limits for tasks assessed                     General Comments       Exercises       Shoulder Instructions      Home Living Family/patient expects to be discharged to:: Private residence Living Arrangements: Spouse/significant other Available Help at Discharge: Family;Available 24 hours/day Type of Home: House             Bathroom Shower/Tub: Walk-in Corporate treasurer Toilet: Handicapped height     Home Equipment: Bedside commode  Prior Functioning/Environment Level of Independence: Independent                 OT Problem List: Decreased knowledge of use of DME or AE      OT Treatment/Interventions:      OT Goals(Current goals can be found in the care plan section) Acute Rehab OT Goals Patient Stated Goal: go home  OT Frequency:     Barriers to D/C:            Co-evaluation              End of Session Equipment Utilized During Treatment: Gait belt;Rolling walker CPM Right Knee CPM Right Knee: On Right Knee Flexion (Degrees): 60 Right Knee Extension (Degrees): 0  Activity Tolerance: Patient tolerated treatment well Patient left: in chair;with call bell/phone within reach;with family/visitor present  OT Visit Diagnosis: Muscle weakness (generalized) (M62.81)                 ADL either performed or assessed with clinical judgement  Time: 0939-    Charges:    G-Codes:     Clinical jludgement clilck score 18.  Bryan [name] 02/27/2017, 10:16 AM

## 2017-02-27 NOTE — Progress Notes (Signed)
Orthopedic Tech Progress Note Patient Details:  Bryan [name] [birth year] [id number]  Patient ID: [name], male   DOB: March 25, 1966, 51 y.o.   MRN: [id number] Pt wants to wait till after breakfast to get in cpm. Will call.  Trinna Post 02/27/2017, 5:43 AM

## 2017-02-27 NOTE — Discharge Instructions (Signed)

## 2017-02-27 NOTE — Progress Notes (Signed)
Physical Therapy Treatment Patient Details Name: Bryan [name] MRN: [id number] DOB: 1966/02/19 Today's Date: 02/27/2017    History of Present Illness 51 y.o. male s/p Rt TKA. PMH: HTN, bells palsy.     PT Comments    Patient is making good progress with PT.  From a mobility standpoint anticipate patient will be ready for DC home when medically ready.      Follow Up Recommendations  Home health PT;Supervision for mobility/OOB     Equipment Recommendations  Rolling walker with 5" wheels (bariatric)    Recommendations for Other Services       Precautions / Restrictions Precautions Precautions: Knee Precaution Booklet Issued: Yes (comment) Precaution Comments: HEP provided, reviewed knee extension precautions Restrictions Weight Bearing Restrictions: Yes RLE Weight Bearing: Weight bearing as tolerated    Mobility  Bed Mobility Overal bed mobility: Needs Assistance Bed Mobility: Supine to Sit     Supine to sit: Min guard;HOB elevated     General bed mobility comments: pt OOB in chair upon arrival  Transfers Overall transfer level: Needs assistance Equipment used: Rolling walker (2 wheeled) Transfers: Sit to/from Stand Sit to Stand: Supervision         General transfer comment: cues for safety; pt tends to leave RW and take steps to chair  Ambulation/Gait Ambulation/Gait assistance: Supervision Ambulation Distance (Feet): 250 Feet Assistive device: Rolling walker (2 wheeled) Gait Pattern/deviations: Decreased weight shift to right;Step-through pattern Gait velocity: decreased Gait velocity interpretation: at or above normal speed for age/gender General Gait Details: cues for safety, cadence, and posture   Stairs Stairs: Yes   Stair Management: Two rails;Step to pattern;Forwards Number of Stairs: 2 General stair comments: cues for sequencing and technique; supervision for safety; therapist demonstrated alternative technique of going sideways to give  pt another option  Wheelchair Mobility    Modified Rankin (Stroke Patients Only)       Balance Overall balance assessment: Needs assistance Sitting-balance support: No upper extremity supported Sitting balance-Leahy Scale: Good     Standing balance support: Bilateral upper extremity supported Standing balance-Leahy Scale: Fair Standing balance comment: using rw                    Cognition Arousal/Alertness: Awake/alert Behavior During Therapy: WFL for tasks assessed/performed Overall Cognitive Status: Within Functional Limits for tasks assessed                      Exercises Total Joint Exercises Quad Sets: Strengthening;Right;10 reps Towel Squeeze: AROM;10 reps Short Arc Quad: AROM;Right;10 reps Heel Slides: AROM;Right;10 reps Hip ABduction/ADduction: AROM;Right;10 reps Straight Leg Raises: AROM;Right;10 reps Goniometric ROM: 5-90    General Comments General comments (skin integrity, edema, etc.): wife present in room      Pertinent Vitals/Pain Pain Assessment: Faces Faces Pain Scale: Hurts little more Pain Location: Rt knee Pain Descriptors / Indicators: Sore;Grimacing Pain Intervention(s): Monitored during session;Premedicated before session;Repositioned    Home Living                      Prior Function            PT Goals (current goals can now be found in the care plan section) Acute Rehab PT Goals Patient Stated Goal: less pain PT Goal Formulation: With patient Time For Goal Achievement: 03/12/17 Potential to Achieve Goals: Good Progress towards PT goals: Progressing toward goals    Frequency    7X/week      PT Plan Current  plan remains appropriate    Co-evaluation             End of Session Equipment Utilized During Treatment: Gait belt Activity Tolerance: Patient tolerated treatment well Patient left: in chair;with call bell/phone within reach Nurse Communication: Mobility status PT Visit Diagnosis:  Muscle weakness (generalized) (M62.81);Difficulty in walking, not elsewhere classified (R26.2)     Time: 2297-9892 PT Time Calculation (min) (ACUTE ONLY): 24 min  Charges:  $Gait Training: 23-37 mins                     G Codes:       Derek Mound, PTA Pager: [phone]   02/27/2017, 2:28 PM

## 2017-02-27 NOTE — Progress Notes (Signed)
Orthopedic Tech Progress Note Patient Details:  Bryan [name] 08-Feb-1966 [id number]  Patient ID: [name], male   DOB: 1966/02/21, 51 y.o.   MRN: [id number] Pt changed his mind about cpm before I left the floor and I put him in cpm.  Trinna Post 02/27/2017, 6:26 AM

## 2017-02-27 NOTE — Discharge Summary (Signed)
Patient ID: Bryan [name] MRN: [id number] DOB/AGE: 1966-02-13 51 y.o.  Admit date: 02/26/2017 Discharge date: 02/27/2017  Admission Diagnoses:  Principal Problem:   Primary localized osteoarthritis of right knee Active Problems:   Obstructive sleep apnea   Hypothyroidism   Hypertension   Vitamin D deficiency   Hyperlipidemia   Family history of heart disease   Discharge Diagnoses:  Same  Past Medical History:  Diagnosis Date  . Elevated cholesterol   . Hypertension   . Hypothyroidism   . Neuromuscular disorder (HCC)    bells palsy  . OSA (obstructive sleep apnea)    uses CPAP  . Primary localized osteoarthritis of right knee   . Vitamin D deficiency     Surgeries: Procedure(s): TOTAL KNEE ARTHROPLASTY on 02/26/2017   Consultants:   Discharged Condition: Improved  Hospital Course: ISSACC MERLO is an 51 y.o. male who was admitted 02/26/2017 for operative treatment ofPrimary localized osteoarthritis of right knee. Patient has severe unremitting pain that affects sleep, daily activities, and work/hobbies. After pre-op clearance the patient was taken to the operating room on 02/26/2017 and underwent  Procedure(s): TOTAL KNEE ARTHROPLASTY.    Patient was given perioperative antibiotics: Anti-infectives    Start     Dose/Rate Route Frequency Ordered Stop   02/26/17 1930  vancomycin (VANCOCIN) IVPB 1000 mg/200 mL premix     1,000 mg 200 mL/hr over 60 Minutes Intravenous Every 12 hours 02/26/17 1455 02/26/17 2156   02/26/17 0959  tobramycin (NEBCIN) powder  Status:  Discontinued       As needed 02/26/17 0959 02/26/17 1106   02/26/17 0700  vancomycin (VANCOCIN) 1,500 mg in sodium chloride 0.9 % 500 mL IVPB     1,500 mg 250 mL/hr over 120 Minutes Intravenous To ShortStay Surgical 02/25/17 0735 02/26/17 1000       Patient was given sequential compression devices, early ambulation, and chemoprophylaxis to prevent DVT.  Patient benefited maximally from hospital stay  and there were no complications.    Recent vital signs: Patient Vitals for the past 24 hrs:  BP Temp Temp src Pulse Resp SpO2  02/27/17 0400 129/60 98.4 F (36.9 C) Oral 80 18 95 %  02/27/17 0030 133/65 98.4 F (36.9 C) Oral 75 18 95 %  02/26/17 1931 (!) 141/66 98.4 F (36.9 C) Oral 87 18 90 %  02/26/17 1459 (!) 155/81 97.9 F (36.6 C) Oral 70 18 98 %  02/26/17 1410 (!) 138/95 97.5 F (36.4 C) - 73 18 98 %  02/26/17 1340 127/77 - - 71 16 96 %  02/26/17 1310 121/72 - - 66 (!) 22 96 %  02/26/17 1240 133/83 - - 65 (!) 21 99 %  02/26/17 1225 134/84 - - 65 13 99 %  02/26/17 1210 132/64 97.6 F (36.4 C) - 62 (!) 22 100 %  02/26/17 1145 122/78 - - 68 17 98 %  02/26/17 1140 - - - 65 17 100 %  02/26/17 1125 111/67 - - 70 17 98 %  02/26/17 1110 125/69 97.8 F (36.6 C) - 77 11 93 %     Recent laboratory studies:  Recent Labs  02/27/17 0629  WBC 14.3*  HGB 14.1  HCT 40.4  PLT 240  NA 137  K 4.0  CL 106  CO2 23  BUN 10  CREATININE 0.84  GLUCOSE 164*  CALCIUM 8.7*     Discharge Medications:   Allergies as of 02/27/2017      Reactions   Lipitor [atorvastatin  Calcium] Other (See Comments)   MYALGIAS Muscle aches.   Penicillins Hives, Swelling   SWELLING REACTION UNSPECIFIED  Has patient had a PCN reaction causing immediate rash, facial/tongue/throat swelling, SOB or lightheadedness with hypotension:unsure Has patient had a PCN reaction causing severe rash involving mucus membranes or skin necrosis:unsure Has patient had a PCN reaction that required hospitalization:No Has patient had a PCN reaction occurring within the last 10 years:No If all of the above answers are "NO", then may proceed with Cephalosporin use.      Medication List    STOP taking these medications   diclofenac 75 MG EC tablet Commonly known as:  VOLTAREN   TYLENOL 8 HOUR ARTHRITIS PAIN 650 MG CR tablet Generic drug:  acetaminophen     TAKE these medications   aspirin EC 325 MG tablet Take 1  tablet (325 mg total) by mouth daily. 1 tab a day for the next 30 days to prevent blood clots   clobetasol ointment 0.05 % Commonly known as:  TEMOVATE Apply 1 application topically daily as needed (for psorasis).   levothyroxine 300 MCG tablet Commonly known as:  SYNTHROID, LEVOTHROID Take 300 mcg by mouth daily before breakfast.   lisinopril 20 MG tablet Commonly known as:  PRINIVIL,ZESTRIL Take 20 mg by mouth daily.   loratadine 10 MG tablet Commonly known as:  CLARITIN Take 10 mg by mouth daily.   methocarbamol 500 MG tablet Commonly known as:  ROBAXIN Take 1 tablet (500 mg total) by mouth 4 (four) times daily.   MOISTURIZING LOTION EX Apply 1 application topically 3 (three) times daily as needed (for dry skin therapy.). [place] Hand Lotion.   nebivolol 10 MG tablet Commonly known as:  BYSTOLIC Take 10 mg by mouth daily.   ondansetron 4 MG tablet Commonly known as:  ZOFRAN Take 1 tablet (4 mg total) by mouth every 8 (eight) hours as needed for nausea or vomiting.   oxyCODONE 5 MG immediate release tablet Commonly known as:  ROXICODONE Take 1-2 tabs po q 4-6 hours prn pain   ranitidine 150 MG capsule Commonly known as:  ZANTAC Take 150 mg by mouth 2 (two) times daily.   rosuvastatin 20 MG tablet Commonly known as:  CRESTOR Take 20 mg by mouth at bedtime.   tamsulosin 0.4 MG Caps capsule Commonly known as:  FLOMAX Take 0.4 mg by mouth at bedtime.       Diagnostic Studies: Dg Chest 2 View  Result Date: 02/18/2017 CLINICAL DATA:  Preoperative evaluation for upcoming knee surgery EXAM: CHEST  2 VIEW COMPARISON:  01/30/17 FINDINGS: Cardiac shadow is within normal limits. The lungs are well aerated bilaterally. Nipple shadows are seen bilaterally. No focal infiltrate or sizable effusion is seen. No bony abnormality is noted. IMPRESSION: No active cardiopulmonary disease. Electronically Signed   By: Alcide Clever M.D.   On: 02/18/2017 11:58   Dg Chest 2 View  Result  Date: 01/30/2017 CLINICAL DATA:  Knee replacement. EXAM: CHEST  2 VIEW COMPARISON:  Prior report 02/08/1999. FINDINGS: Mediastinum and hilar structures are normal. Low lung volumes with mild bibasilar atelectasis. No acute pulmonary infiltrate. No pleural effusion or pneumothorax . Cardiomegaly. No pulmonary venous congestion. No acute bony abnormality . IMPRESSION: 1. Cardiomegaly.  No pulmonary venous congestion. 2. Mild basilar subsegmental atelectasis. No acute pulmonary infiltrate. Electronically Signed   By: Maisie Fus  Register   On: 01/30/2017 11:36    Disposition: Final discharge disposition not confirmed    Follow-up Information    WAINER,ROBERT A,  MD Follow up on 03/11/2017.   Specialty:  Orthopedic Surgery Why:  appt time 10 am Contact information: [address] [place] [address] [phone]            Signed: Otilio Saber 02/27/2017, 8:52 AM

## 2017-02-27 NOTE — Care Management Note (Signed)
Case Management Note  Patient Details  Name: Bryan [name] MRN: [id number] Date of Birth: 02/25/1966  Subjective/Objective:     51 yr old gentleman s/p right total knee arthroplasty.                Action/Plan:  Case manager spoke with patient's wife (patient was on line with his employer). Discussed discharge plan and DME needs. Patient was preoperatively setup with [facility], no changes. Patient has rolling walker, has declined 3in1. CPM will be delivered to his home. Patient will have family support at discharge.    Expected Discharge Date:  02/27/17               Expected Discharge Plan:  Home w Home Health Services  In-House Referral:  NA  Discharge planning Services  CM Consult  Post Acute Care Choice:  Home Health, Durable Medical Equipment Choice offered to:  Patient, Spouse  DME Arranged:  CPM, Walker rolling DME Agency:  [employer]  HH Arranged:  PT, RN HH Agency:  [facility]  Status of Service:  Completed, signed off  If discussed at [employer] [facility] [employer], dates discussed:    Additional Comments:  Durenda Guthrie, RN 02/27/2017, 11:37 AM

## 2017-02-27 NOTE — Progress Notes (Signed)
Subjective: 1 Day Post-Op Procedure(s) (LRB): TOTAL KNEE ARTHROPLASTY (Right) Patient reports pain as 4 on 0-10 scale.  No nausea/vomiting, lightheadedness/dizziness, chest pain/sob.  Objective: Vital signs in last 24 hours: Temp:  [97.5 F (36.4 C)-98.4 F (36.9 C)] 98.4 F (36.9 C) (03/15 0400) Pulse Rate:  [62-87] 80 (03/15 0400) Resp:  [11-22] 18 (03/15 0400) BP: (111-155)/(60-95) 129/60 (03/15 0400) SpO2:  [90 %-100 %] 95 % (03/15 0400)  Intake/Output from previous day: 03/14 0701 - 03/15 0700 In: 1670 [I.V.:1060; IV Piggyback:610] Out: 600 [Urine:550; Blood:50] Intake/Output this shift: No intake/output data recorded.   Recent Labs  02/27/17 0629  HGB 14.1    Recent Labs  02/27/17 0629  WBC 14.3*  RBC 4.44  HCT 40.4  PLT 240    Recent Labs  02/27/17 0629  NA 137  K 4.0  CL 106  CO2 23  BUN 10  CREATININE 0.84  GLUCOSE 164*  CALCIUM 8.7*   No results for input(s): LABPT, INR in the last 72 hours.  Neurologically intact Neurovascular intact Sensation intact distally Intact pulses distally Dorsiflexion/Plantar flexion intact Incision: dressing C/D/I No cellulitis present Compartment soft  Assessment/Plan: 1 Day Post-Op Procedure(s) (LRB): TOTAL KNEE ARTHROPLASTY (Right) Advance diet Up with therapy D/C IV fluids Discharge home with home health after second session of PT WBAT RLE Please remove ACE bandage and apply ted hose to operative extremity prior to discharge ASA 325 for dvt ppx  Otilio Saber 02/27/2017, 8:49 AM

## 2017-02-28 DIAGNOSIS — Z7982 Long term (current) use of aspirin: Secondary | ICD-10-CM | POA: Diagnosis not present

## 2017-02-28 DIAGNOSIS — Z96641 Presence of right artificial hip joint: Secondary | ICD-10-CM | POA: Diagnosis not present

## 2017-02-28 DIAGNOSIS — E785 Hyperlipidemia, unspecified: Secondary | ICD-10-CM | POA: Diagnosis not present

## 2017-02-28 DIAGNOSIS — Z79891 Long term (current) use of opiate analgesic: Secondary | ICD-10-CM | POA: Diagnosis not present

## 2017-02-28 DIAGNOSIS — Z471 Aftercare following joint replacement surgery: Secondary | ICD-10-CM | POA: Diagnosis not present

## 2017-02-28 DIAGNOSIS — G4733 Obstructive sleep apnea (adult) (pediatric): Secondary | ICD-10-CM | POA: Diagnosis not present

## 2017-02-28 DIAGNOSIS — G510 Bell's palsy: Secondary | ICD-10-CM | POA: Diagnosis not present

## 2017-02-28 DIAGNOSIS — E559 Vitamin D deficiency, unspecified: Secondary | ICD-10-CM | POA: Diagnosis not present

## 2017-02-28 DIAGNOSIS — I10 Essential (primary) hypertension: Secondary | ICD-10-CM | POA: Diagnosis not present

## 2017-03-03 DIAGNOSIS — G510 Bell's palsy: Secondary | ICD-10-CM | POA: Diagnosis not present

## 2017-03-03 DIAGNOSIS — Z7982 Long term (current) use of aspirin: Secondary | ICD-10-CM | POA: Diagnosis not present

## 2017-03-03 DIAGNOSIS — G4733 Obstructive sleep apnea (adult) (pediatric): Secondary | ICD-10-CM | POA: Diagnosis not present

## 2017-03-03 DIAGNOSIS — I10 Essential (primary) hypertension: Secondary | ICD-10-CM | POA: Diagnosis not present

## 2017-03-03 DIAGNOSIS — E785 Hyperlipidemia, unspecified: Secondary | ICD-10-CM | POA: Diagnosis not present

## 2017-03-03 DIAGNOSIS — Z471 Aftercare following joint replacement surgery: Secondary | ICD-10-CM | POA: Diagnosis not present

## 2017-03-03 DIAGNOSIS — Z79891 Long term (current) use of opiate analgesic: Secondary | ICD-10-CM | POA: Diagnosis not present

## 2017-03-03 DIAGNOSIS — E559 Vitamin D deficiency, unspecified: Secondary | ICD-10-CM | POA: Diagnosis not present

## 2017-03-03 DIAGNOSIS — Z96641 Presence of right artificial hip joint: Secondary | ICD-10-CM | POA: Diagnosis not present

## 2017-03-04 NOTE — Progress Notes (Signed)
Late entry for missing G-code.   2017/03/19 1606  PT G-Codes **NOT FOR INPATIENT CLASS**  Functional Assessment Tool Used Clinical judgement  Functional Limitation Mobility: Walking and moving around  Mobility: Walking and Moving Around Current Status (P8242) CK  Mobility: Walking and Moving Around Goal Status (P5361) CI  Christiane Ha, PT, CSCS Pager [phone] Office [phone]

## 2017-03-05 DIAGNOSIS — Z471 Aftercare following joint replacement surgery: Secondary | ICD-10-CM | POA: Diagnosis not present

## 2017-03-05 DIAGNOSIS — E785 Hyperlipidemia, unspecified: Secondary | ICD-10-CM | POA: Diagnosis not present

## 2017-03-05 DIAGNOSIS — Z96641 Presence of right artificial hip joint: Secondary | ICD-10-CM | POA: Diagnosis not present

## 2017-03-05 DIAGNOSIS — Z7982 Long term (current) use of aspirin: Secondary | ICD-10-CM | POA: Diagnosis not present

## 2017-03-05 DIAGNOSIS — E559 Vitamin D deficiency, unspecified: Secondary | ICD-10-CM | POA: Diagnosis not present

## 2017-03-05 DIAGNOSIS — Z79891 Long term (current) use of opiate analgesic: Secondary | ICD-10-CM | POA: Diagnosis not present

## 2017-03-05 DIAGNOSIS — I10 Essential (primary) hypertension: Secondary | ICD-10-CM | POA: Diagnosis not present

## 2017-03-05 DIAGNOSIS — G510 Bell's palsy: Secondary | ICD-10-CM | POA: Diagnosis not present

## 2017-03-05 DIAGNOSIS — G4733 Obstructive sleep apnea (adult) (pediatric): Secondary | ICD-10-CM | POA: Diagnosis not present

## 2017-03-07 DIAGNOSIS — G4733 Obstructive sleep apnea (adult) (pediatric): Secondary | ICD-10-CM | POA: Diagnosis not present

## 2017-03-07 DIAGNOSIS — G510 Bell's palsy: Secondary | ICD-10-CM | POA: Diagnosis not present

## 2017-03-07 DIAGNOSIS — I10 Essential (primary) hypertension: Secondary | ICD-10-CM | POA: Diagnosis not present

## 2017-03-07 DIAGNOSIS — Z96641 Presence of right artificial hip joint: Secondary | ICD-10-CM | POA: Diagnosis not present

## 2017-03-07 DIAGNOSIS — E785 Hyperlipidemia, unspecified: Secondary | ICD-10-CM | POA: Diagnosis not present

## 2017-03-07 DIAGNOSIS — Z471 Aftercare following joint replacement surgery: Secondary | ICD-10-CM | POA: Diagnosis not present

## 2017-03-07 DIAGNOSIS — Z79891 Long term (current) use of opiate analgesic: Secondary | ICD-10-CM | POA: Diagnosis not present

## 2017-03-07 DIAGNOSIS — Z7982 Long term (current) use of aspirin: Secondary | ICD-10-CM | POA: Diagnosis not present

## 2017-03-07 DIAGNOSIS — E559 Vitamin D deficiency, unspecified: Secondary | ICD-10-CM | POA: Diagnosis not present

## 2017-03-10 DIAGNOSIS — E559 Vitamin D deficiency, unspecified: Secondary | ICD-10-CM | POA: Diagnosis not present

## 2017-03-10 DIAGNOSIS — Z79891 Long term (current) use of opiate analgesic: Secondary | ICD-10-CM | POA: Diagnosis not present

## 2017-03-10 DIAGNOSIS — Z7982 Long term (current) use of aspirin: Secondary | ICD-10-CM | POA: Diagnosis not present

## 2017-03-10 DIAGNOSIS — E785 Hyperlipidemia, unspecified: Secondary | ICD-10-CM | POA: Diagnosis not present

## 2017-03-10 DIAGNOSIS — Z96641 Presence of right artificial hip joint: Secondary | ICD-10-CM | POA: Diagnosis not present

## 2017-03-10 DIAGNOSIS — G510 Bell's palsy: Secondary | ICD-10-CM | POA: Diagnosis not present

## 2017-03-10 DIAGNOSIS — G4733 Obstructive sleep apnea (adult) (pediatric): Secondary | ICD-10-CM | POA: Diagnosis not present

## 2017-03-10 DIAGNOSIS — Z471 Aftercare following joint replacement surgery: Secondary | ICD-10-CM | POA: Diagnosis not present

## 2017-03-10 DIAGNOSIS — I10 Essential (primary) hypertension: Secondary | ICD-10-CM | POA: Diagnosis not present

## 2017-03-11 DIAGNOSIS — Z96651 Presence of right artificial knee joint: Secondary | ICD-10-CM | POA: Diagnosis not present

## 2017-03-12 DIAGNOSIS — M25661 Stiffness of right knee, not elsewhere classified: Secondary | ICD-10-CM | POA: Diagnosis not present

## 2017-03-12 DIAGNOSIS — M25561 Pain in right knee: Secondary | ICD-10-CM | POA: Diagnosis not present

## 2017-03-12 DIAGNOSIS — M6281 Muscle weakness (generalized): Secondary | ICD-10-CM | POA: Diagnosis not present

## 2017-03-12 DIAGNOSIS — R262 Difficulty in walking, not elsewhere classified: Secondary | ICD-10-CM | POA: Diagnosis not present

## 2017-03-13 ENCOUNTER — Encounter (HOSPITAL_COMMUNITY): Payer: Self-pay | Admitting: Orthopedic Surgery

## 2017-03-13 NOTE — Addendum Note (Signed)
Addendum  created 03/13/17 2016 by Arta Bruce, MD   Anesthesia Event edited, Anesthesia Staff edited

## 2017-03-13 NOTE — Addendum Note (Signed)
Addendum  created 03/13/17 2011 by Arta Bruce, MD   Anesthesia Event edited, Anesthesia Staff edited

## 2017-03-18 DIAGNOSIS — M25661 Stiffness of right knee, not elsewhere classified: Secondary | ICD-10-CM | POA: Diagnosis not present

## 2017-03-18 DIAGNOSIS — M25561 Pain in right knee: Secondary | ICD-10-CM | POA: Diagnosis not present

## 2017-03-18 DIAGNOSIS — M6281 Muscle weakness (generalized): Secondary | ICD-10-CM | POA: Diagnosis not present

## 2017-03-18 DIAGNOSIS — R262 Difficulty in walking, not elsewhere classified: Secondary | ICD-10-CM | POA: Diagnosis not present

## 2017-03-20 DIAGNOSIS — R262 Difficulty in walking, not elsewhere classified: Secondary | ICD-10-CM | POA: Diagnosis not present

## 2017-03-20 DIAGNOSIS — M25661 Stiffness of right knee, not elsewhere classified: Secondary | ICD-10-CM | POA: Diagnosis not present

## 2017-03-20 DIAGNOSIS — M25561 Pain in right knee: Secondary | ICD-10-CM | POA: Diagnosis not present

## 2017-03-20 DIAGNOSIS — M6281 Muscle weakness (generalized): Secondary | ICD-10-CM | POA: Diagnosis not present

## 2017-03-21 DIAGNOSIS — M25661 Stiffness of right knee, not elsewhere classified: Secondary | ICD-10-CM | POA: Diagnosis not present

## 2017-03-21 DIAGNOSIS — M6281 Muscle weakness (generalized): Secondary | ICD-10-CM | POA: Diagnosis not present

## 2017-03-21 DIAGNOSIS — R262 Difficulty in walking, not elsewhere classified: Secondary | ICD-10-CM | POA: Diagnosis not present

## 2017-03-21 DIAGNOSIS — M25561 Pain in right knee: Secondary | ICD-10-CM | POA: Diagnosis not present

## 2017-03-24 DIAGNOSIS — M25561 Pain in right knee: Secondary | ICD-10-CM | POA: Diagnosis not present

## 2017-03-24 DIAGNOSIS — M6281 Muscle weakness (generalized): Secondary | ICD-10-CM | POA: Diagnosis not present

## 2017-03-24 DIAGNOSIS — M25661 Stiffness of right knee, not elsewhere classified: Secondary | ICD-10-CM | POA: Diagnosis not present

## 2017-03-24 DIAGNOSIS — R262 Difficulty in walking, not elsewhere classified: Secondary | ICD-10-CM | POA: Diagnosis not present

## 2017-03-25 ENCOUNTER — Encounter (HOSPITAL_COMMUNITY): Payer: Self-pay | Admitting: Physician Assistant

## 2017-03-25 DIAGNOSIS — Z96651 Presence of right artificial knee joint: Secondary | ICD-10-CM

## 2017-03-25 DIAGNOSIS — M25562 Pain in left knee: Secondary | ICD-10-CM | POA: Diagnosis not present

## 2017-03-25 DIAGNOSIS — M1712 Unilateral primary osteoarthritis, left knee: Secondary | ICD-10-CM

## 2017-03-25 HISTORY — DX: Unilateral primary osteoarthritis, left knee: M17.12

## 2017-03-25 HISTORY — DX: Presence of right artificial knee joint: Z96.651

## 2017-03-25 NOTE — H&P (Signed)
TOTAL KNEE ADMISSION H&P  Patient is being admitted for left total knee arthroplasty.  Subjective:  Chief Complaint:left knee pain.  HPI: Bryan [name], 51 y.o. male, has a history of pain and functional disability in the left knee due to arthritis and has failed non-surgical conservative treatments for greater than 12 weeks to includeNSAID's and/or analgesics, corticosteriod injections, viscosupplementation injections, flexibility and strengthening excercises, supervised PT with diminished ADL's post treatment, use of assistive devices and activity modification.  Onset of symptoms was gradual, starting 10 years ago with gradually worsening course since that time. The patient noted no past surgery on the left knee(s).  Patient currently rates pain in the left knee(s) at 10 out of 10 with activity. Patient has night pain, worsening of pain with activity and weight bearing, pain that interferes with activities of daily living, crepitus and joint swelling.  Patient has evidence of subchondral sclerosis, periarticular osteophytes and joint space narrowing by imaging studies. There is no active infection.  Patient Active Problem List   Diagnosis Date Noted  . Primary localized osteoarthritis of left knee 03/25/2017  . S/P total knee arthroplasty, right 03/25/2017  . Hyperlipidemia 02/14/2017  . Family history of heart disease 02/14/2017  . Preoperative clearance 02/14/2017  . Primary localized osteoarthritis of right knee   . Obstructive sleep apnea   . Hypothyroidism   . Hypertension   . Vitamin D deficiency    Past Medical History:  Diagnosis Date  . Elevated cholesterol   . Hypertension   . Hypothyroidism   . Neuromuscular disorder (HCC)    bells palsy  . OSA (obstructive sleep apnea)    uses CPAP  . Primary localized osteoarthritis of left knee 03/25/2017  . Primary localized osteoarthritis of right knee   . S/P total knee arthroplasty, right 03/25/2017  . Vitamin D deficiency      Past Surgical History:  Procedure Laterality Date  . meniscus Right 2017   torn meniscus  . TOTAL KNEE ARTHROPLASTY Right 02/26/2017   Procedure: TOTAL KNEE ARTHROPLASTY;  Surgeon: Salvatore Marvel, MD;  Location: [facility] OR;  Service: Orthopedics;  Laterality: Right;    No current facility-administered medications for this encounter.   Current Outpatient Prescriptions:  .  aspirin EC 325 MG tablet, Take 1 tablet (325 mg total) by mouth daily. 1 tab a day for the next 30 days to prevent blood clots, Disp: 30 tablet, Rfl: 0 .  clobetasol ointment (TEMOVATE) 0.05 %, Apply 1 application topically daily as needed (for psorasis). , Disp: , Rfl:  .  Emollient (MOISTURIZING LOTION EX), Apply 1 application topically 3 (three) times daily as needed (for dry skin therapy.). [place] Hand Lotion., Disp: , Rfl:  .  levothyroxine (SYNTHROID, LEVOTHROID) 300 MCG tablet, Take 300 mcg by mouth daily before breakfast., Disp: , Rfl:  .  lisinopril (PRINIVIL,ZESTRIL) 20 MG tablet, Take 20 mg by mouth daily., Disp: , Rfl:  .  loratadine (CLARITIN) 10 MG tablet, Take 10 mg by mouth daily., Disp: , Rfl:  .  nebivolol (BYSTOLIC) 10 MG tablet, Take 10 mg by mouth daily., Disp: , Rfl:  .  oxyCODONE (ROXICODONE) 5 MG immediate release tablet, Take 1-2 tabs po q 4-6 hours prn pain, Disp: 60 tablet, Rfl: 0 .  ranitidine (ZANTAC) 150 MG capsule, Take 150 mg by mouth 2 (two) times daily., Disp: , Rfl:  .  rosuvastatin (CRESTOR) 20 MG tablet, Take 20 mg by mouth at bedtime. , Disp: , Rfl:  .  tamsulosin (FLOMAX) 0.4  MG CAPS capsule, Take 0.4 mg by mouth at bedtime., Disp: , Rfl:     Allergies  Allergen Reactions  . Lipitor [Atorvastatin Calcium] Other (See Comments)    MYALGIAS Muscle aches.  . Penicillins Hives and Swelling    SWELLING REACTION UNSPECIFIED  Has patient had a PCN reaction causing immediate rash, facial/tongue/throat swelling, SOB or lightheadedness with hypotension:unsure Has patient had a PCN reaction  causing severe rash involving mucus membranes or skin necrosis:unsure Has patient had a PCN reaction that required hospitalization:No Has patient had a PCN reaction occurring within the last 10 years:No If all of the above answers are "NO", then may proceed with Cephalosporin use.     Social History  Substance Use Topics  . Smoking status: Never Smoker  . Smokeless tobacco: Former Neurosurgeon    Quit date: 01/30/2007  . Alcohol use No    Family History  Problem Relation Age of Onset  . Heart attack Father     stent x 2  . Atrial fibrillation Father   . Deep vein thrombosis Mother   . Rheum arthritis Mother   . Pulmonary embolism    . Hypothyroidism       Review of Systems  Constitutional: Negative.   HENT: Negative.   Eyes: Negative.   Respiratory: Negative.   Cardiovascular: Negative.   Gastrointestinal: Negative.   Genitourinary: Negative.   Musculoskeletal: Positive for joint pain.  Skin: Negative.   Neurological: Negative.   Endo/Heme/Allergies: Negative.   Psychiatric/Behavioral: Negative.     Objective:  Physical Exam  Constitutional: He is oriented to person, place, and time. He appears well-developed and well-nourished.  HENT:  Head: Normocephalic and atraumatic.  Mouth/Throat: Oropharynx is clear and moist.  Eyes: Conjunctivae are normal.  Neck: Neck supple.  Cardiovascular: Normal rate and regular rhythm.   Respiratory: Effort normal and breath sounds normal.  GI: Soft. Bowel sounds are normal.  Genitourinary:  Genitourinary Comments: Not pertinent to current symptomatology therefore not examined.  Musculoskeletal:  Left knee 0-110 2+crep 2+ synovitis 2+ DP pulse.  Medial pain and swelling.  Right knee has well healed total knee incision with mild swelling mild pain.  2+ DP   Neurological: He is alert and oriented to person, place, and time.  Skin: Skin is warm and dry.  Psychiatric: He has a normal mood and affect. His behavior is normal.    Vital signs  in last 24 hours: Temp:  [97.6 F (36.4 C)] 97.6 F (36.4 C) (04/10 0900) Pulse Rate:  [79] 79 (04/10 0900) BP: (131)/(80) 131/80 (04/10 0900) SpO2:  [96 %] 96 % (04/10 0900) Weight:  [137.9 kg (304 lb)] 137.9 kg (304 lb) (04/10 0900)  Labs:   Estimated body mass index is 40.11 kg/m as calculated from the following:   Height as of this encounter: 6\' 1"  (1.854 m).   Weight as of this encounter: 137.9 kg (304 lb).   Imaging Review Plain radiographs demonstrate severe degenerative joint disease of the left knee(s). The overall alignment issignificant varus. The bone quality appears to be good for age and reported activity level.  Assessment/Plan:  End stage arthritis, left knee Principal Problem:   Primary localized osteoarthritis of left knee Active Problems:   Obstructive sleep apnea   Hypothyroidism   Hypertension   Vitamin D deficiency   Hyperlipidemia   S/P total knee arthroplasty, right    The patient history, physical examination, clinical judgment of the provider and imaging studies are consistent with end stage degenerative joint  disease of the left knee(s) and total knee arthroplasty is deemed medically necessary. The treatment options including medical management, injection therapy arthroscopy and arthroplasty were discussed at length. The risks and benefits of total knee arthroplasty were presented and reviewed. The risks due to aseptic loosening, infection, stiffness, patella tracking problems, thromboembolic complications and other imponderables were discussed. The patient acknowledged the explanation, agreed to proceed with the plan and consent was signed. Patient is being admitted for inpatient treatment for surgery, pain control, PT, OT, prophylactic antibiotics, VTE prophylaxis, progressive ambulation and ADL's and discharge planning. The patient is planning to be discharged home with home health services

## 2017-03-26 DIAGNOSIS — M6281 Muscle weakness (generalized): Secondary | ICD-10-CM | POA: Diagnosis not present

## 2017-03-26 DIAGNOSIS — R262 Difficulty in walking, not elsewhere classified: Secondary | ICD-10-CM | POA: Diagnosis not present

## 2017-03-26 DIAGNOSIS — M25561 Pain in right knee: Secondary | ICD-10-CM | POA: Diagnosis not present

## 2017-03-26 DIAGNOSIS — M25661 Stiffness of right knee, not elsewhere classified: Secondary | ICD-10-CM | POA: Diagnosis not present

## 2017-03-27 ENCOUNTER — Encounter (HOSPITAL_COMMUNITY): Payer: Self-pay

## 2017-03-27 ENCOUNTER — Encounter (HOSPITAL_COMMUNITY)
Admission: RE | Admit: 2017-03-27 | Discharge: 2017-03-27 | Disposition: A | Discharge status: Home / Self Care | Payer: BLUE CROSS/BLUE SHIELD | Source: Ambulatory Visit | Attending: Orthopedic Surgery | Admitting: Orthopedic Surgery

## 2017-03-27 DIAGNOSIS — M1712 Unilateral primary osteoarthritis, left knee: Secondary | ICD-10-CM | POA: Insufficient documentation

## 2017-03-27 DIAGNOSIS — Z01818 Encounter for other preprocedural examination: Secondary | ICD-10-CM | POA: Diagnosis not present

## 2017-03-27 HISTORY — DX: Allergy, unspecified, initial encounter: T78.40XA

## 2017-03-27 HISTORY — DX: Gastro-esophageal reflux disease without esophagitis: K21.9

## 2017-03-27 HISTORY — DX: Benign prostatic hyperplasia without lower urinary tract symptoms: N40.0

## 2017-03-27 LAB — BASIC METABOLIC PANEL
ANION GAP: 10 (ref 5–15)
BUN: 11 mg/dL (ref 6–20)
CALCIUM: 9.1 mg/dL (ref 8.9–10.3)
CO2: 21 mmol/L — ABNORMAL LOW (ref 22–32)
Chloride: 110 mmol/L (ref 101–111)
Creatinine, Ser: 0.87 mg/dL (ref 0.61–1.24)
GFR calc Af Amer: >60 mL/min (ref >60)
GFR calc non Af Amer: >60 mL/min (ref >60)
Glucose, Bld: 118 mg/dL — ABNORMAL HIGH (ref 65–99)
POTASSIUM: 3.9 mmol/L (ref 3.5–5.1)
SODIUM: 141 mmol/L (ref 135–145)

## 2017-03-27 LAB — TYPE AND SCREEN
ABO/RH(D): A POS
ANTIBODY SCREEN: NEGATIVE

## 2017-03-27 LAB — CBC
HCT: 42.9 % (ref 39.0–52.0)
Hemoglobin: 14.4 g/dL (ref 13.0–17.0)
MCH: 30.3 pg (ref 26.0–34.0)
MCHC: 33.6 g/dL (ref 30.0–36.0)
MCV: 90.1 fL (ref 78.0–100.0)
PLATELETS: 289 10*3/uL (ref 150–400)
RBC: 4.76 MIL/uL (ref 4.22–5.81)
RDW: 13.2 % (ref 11.5–15.5)
WBC: 5.6 10*3/uL (ref 4.0–10.5)

## 2017-03-27 LAB — URINALYSIS, MICROSCOPIC (REFLEX): SQUAMOUS EPITHELIAL / LPF: NONE SEEN

## 2017-03-27 LAB — SURGICAL PCR SCREEN
MRSA, PCR: NEGATIVE
Staphylococcus aureus: NEGATIVE

## 2017-03-27 LAB — URINALYSIS, ROUTINE W REFLEX MICROSCOPIC
Bilirubin Urine: NEGATIVE
GLUCOSE, UA: NEGATIVE mg/dL
Ketones, ur: NEGATIVE mg/dL
LEUKOCYTES UA: NEGATIVE
Nitrite: NEGATIVE
PH: 5.5 (ref 5.0–8.0)
Protein, ur: NEGATIVE mg/dL
Specific Gravity, Urine: 1.025 (ref 1.005–1.030)

## 2017-03-27 MED ORDER — CHLORHEXIDINE GLUCONATE 4 % EX LIQD: 60.0000 mL | Freq: Once | CUTANEOUS | Status: DC

## 2017-03-27 NOTE — Progress Notes (Signed)
Cardiologist  Medical Md  Echo report in epic from 02-20-17  Stress test  Heart cath  EKG in epic from 01-30-17  CXR in epic from 02-18-17

## 2017-03-27 NOTE — Pre-Procedure Instructions (Signed)
CARDIER KOOB  03/27/2017      [id number] [name], [place] - [address]  [id number] Phone: [phone] Fax: [phone]    Your procedure is scheduled on Mon, April 23 @ 10:00 AM  Report to [facility] Admitting at 8:00 AM  Call this number if you have problems the morning of surgery:  [phone]   Remember:  Do not eat food or drink liquids after midnight.  Take these medicines the morning of surgery with A SIP OF WATER Synthroid(Levothyroxine),Claritin(Loratadine),Bystolic(Nebivolol),and Zantac(Ranitidine)              Stop taking your Aspirin a week prior to surgery. No Goody's,BC's,Aleve,Advil,Motrin,Ibuprofen,Fish Oil,or any Herbal Medications.    Do not wear jewelry.  Do not wear lotions, powders,colognes, or deoderant.  Men may shave face and neck.  Do not bring valuables to the hospital.  [facility] is not responsible for any belongings or valuables.  Contacts, dentures or bridgework may not be worn into surgery.  Leave your suitcase in the car.  After surgery it may be brought to your room.  For patients admitted to the hospital, discharge time will be determined by your treatment team.  Patients discharged the day of surgery will not be allowed to drive home.    Special instructions:   [place] - Preparing for Surgery  Before surgery, you can play an important role.  Because skin is not sterile, your skin needs to be as free of germs as possible.  You can reduce the number of germs on you skin by washing with CHG (chlorahexidine gluconate) soap before surgery.  CHG is an antiseptic cleaner which kills germs and bonds with the skin to continue killing germs even after washing.  Please DO NOT use if you have an allergy to CHG or antibacterial soaps.  If your skin becomes reddened/irritated stop using the CHG and inform your nurse when you arrive at Short Stay.  Do not shave (including legs  and underarms) for at least 48 hours prior to the first CHG shower.  You may shave your face.  Please follow these instructions carefully:   1.  Shower with CHG Soap the night before surgery and the                                morning of Surgery.  2.  If you choose to wash your hair, wash your hair first as usual with your       normal shampoo.  3.  After you shampoo, rinse your hair and body thoroughly to remove the                      Shampoo.  4.  Use CHG as you would any other liquid soap.  You can apply chg directly       to the skin and wash gently with scrungie or a clean washcloth.  5.  Apply the CHG Soap to your body ONLY FROM THE NECK DOWN.        Do not use on open wounds or open sores.  Avoid contact with your eyes,       ears, mouth and genitals (private parts).  Wash genitals (private parts)       with your normal soap.  6.  Wash thoroughly, paying special attention to the area where your surgery  will be performed.  7.  Thoroughly rinse your body with warm water from the neck down.  8.  DO NOT shower/wash with your normal soap after using and rinsing off       the CHG Soap.  9.  Pat yourself dry with a clean towel.            10.  Wear clean pajamas.            11.  Place clean sheets on your bed the night of your first shower and do not        sleep with pets.  Day of Surgery  Do not apply any lotions/deoderants the morning of surgery.  Please wear clean clothes to the hospital/surgery center.   Please read over the following fact sheets that you were given. Pain Booklet, Coughing and Deep Breathing, MRSA Information and Surgical Site Infection Prevention

## 2017-03-28 DIAGNOSIS — M6281 Muscle weakness (generalized): Secondary | ICD-10-CM | POA: Diagnosis not present

## 2017-03-28 DIAGNOSIS — M25661 Stiffness of right knee, not elsewhere classified: Secondary | ICD-10-CM | POA: Diagnosis not present

## 2017-03-28 DIAGNOSIS — R262 Difficulty in walking, not elsewhere classified: Secondary | ICD-10-CM | POA: Diagnosis not present

## 2017-03-28 DIAGNOSIS — M25561 Pain in right knee: Secondary | ICD-10-CM | POA: Diagnosis not present

## 2017-03-28 LAB — URINE CULTURE: Culture: NO GROWTH

## 2017-03-31 DIAGNOSIS — R262 Difficulty in walking, not elsewhere classified: Secondary | ICD-10-CM | POA: Diagnosis not present

## 2017-03-31 DIAGNOSIS — M25661 Stiffness of right knee, not elsewhere classified: Secondary | ICD-10-CM | POA: Diagnosis not present

## 2017-03-31 DIAGNOSIS — M25561 Pain in right knee: Secondary | ICD-10-CM | POA: Diagnosis not present

## 2017-03-31 DIAGNOSIS — M6281 Muscle weakness (generalized): Secondary | ICD-10-CM | POA: Diagnosis not present

## 2017-04-01 DIAGNOSIS — R3129 Other microscopic hematuria: Secondary | ICD-10-CM | POA: Diagnosis not present

## 2017-04-02 DIAGNOSIS — R262 Difficulty in walking, not elsewhere classified: Secondary | ICD-10-CM | POA: Diagnosis not present

## 2017-04-02 DIAGNOSIS — M6281 Muscle weakness (generalized): Secondary | ICD-10-CM | POA: Diagnosis not present

## 2017-04-02 DIAGNOSIS — M25561 Pain in right knee: Secondary | ICD-10-CM | POA: Diagnosis not present

## 2017-04-02 DIAGNOSIS — M25661 Stiffness of right knee, not elsewhere classified: Secondary | ICD-10-CM | POA: Diagnosis not present

## 2017-04-04 MED ORDER — SODIUM CHLORIDE 0.9 % IV SOLN
1000.0000 mg | INTRAVENOUS | Status: DC
  Filled 2017-04-04: qty 10

## 2017-04-04 MED ORDER — VANCOMYCIN HCL 10 G IV SOLR
1500.0000 mg | INTRAVENOUS | Status: AC
  Administered 2017-04-07 (×2): 1500 mg via INTRAVENOUS
  Filled 2017-04-04: qty 1500

## 2017-04-07 ENCOUNTER — Encounter (HOSPITAL_COMMUNITY): Payer: Self-pay | Admitting: *Deleted

## 2017-04-07 ENCOUNTER — Encounter (HOSPITAL_COMMUNITY)
Admission: RE | Disposition: A | Discharge status: Home Health | Payer: Self-pay | Source: Ambulatory Visit | Attending: Orthopedic Surgery

## 2017-04-07 ENCOUNTER — Inpatient Hospital Stay (HOSPITAL_COMMUNITY): Payer: BLUE CROSS/BLUE SHIELD | Admitting: Anesthesiology

## 2017-04-07 ENCOUNTER — Inpatient Hospital Stay (HOSPITAL_COMMUNITY)
Admission: RE | Admit: 2017-04-07 | Discharge: 2017-04-08 | DRG: 470 | Disposition: A | Discharge status: Home Health | Payer: BLUE CROSS/BLUE SHIELD | Source: Ambulatory Visit | Attending: Orthopedic Surgery | Admitting: Orthopedic Surgery

## 2017-04-07 DIAGNOSIS — G510 Bell's palsy: Secondary | ICD-10-CM | POA: Diagnosis not present

## 2017-04-07 DIAGNOSIS — Z7983 Long term (current) use of bisphosphonates: Secondary | ICD-10-CM | POA: Diagnosis not present

## 2017-04-07 DIAGNOSIS — K219 Gastro-esophageal reflux disease without esophagitis: Secondary | ICD-10-CM | POA: Diagnosis not present

## 2017-04-07 DIAGNOSIS — Z888 Allergy status to other drugs, medicaments and biological substances status: Secondary | ICD-10-CM | POA: Diagnosis not present

## 2017-04-07 DIAGNOSIS — E7800 Pure hypercholesterolemia, unspecified: Secondary | ICD-10-CM | POA: Diagnosis not present

## 2017-04-07 DIAGNOSIS — G4733 Obstructive sleep apnea (adult) (pediatric): Secondary | ICD-10-CM | POA: Diagnosis not present

## 2017-04-07 DIAGNOSIS — I10 Essential (primary) hypertension: Secondary | ICD-10-CM | POA: Diagnosis present

## 2017-04-07 DIAGNOSIS — M25762 Osteophyte, left knee: Secondary | ICD-10-CM | POA: Diagnosis present

## 2017-04-07 DIAGNOSIS — Z6839 Body mass index (BMI) 39.0-39.9, adult: Secondary | ICD-10-CM

## 2017-04-07 DIAGNOSIS — E039 Hypothyroidism, unspecified: Secondary | ICD-10-CM | POA: Diagnosis present

## 2017-04-07 DIAGNOSIS — M1712 Unilateral primary osteoarthritis, left knee: Principal | ICD-10-CM | POA: Diagnosis present

## 2017-04-07 DIAGNOSIS — G8918 Other acute postprocedural pain: Secondary | ICD-10-CM | POA: Diagnosis not present

## 2017-04-07 DIAGNOSIS — J301 Allergic rhinitis due to pollen: Secondary | ICD-10-CM | POA: Diagnosis not present

## 2017-04-07 DIAGNOSIS — E559 Vitamin D deficiency, unspecified: Secondary | ICD-10-CM | POA: Diagnosis not present

## 2017-04-07 DIAGNOSIS — Z87891 Personal history of nicotine dependence: Secondary | ICD-10-CM | POA: Diagnosis not present

## 2017-04-07 DIAGNOSIS — Z880 Allergy status to penicillin: Secondary | ICD-10-CM

## 2017-04-07 DIAGNOSIS — Z7982 Long term (current) use of aspirin: Secondary | ICD-10-CM

## 2017-04-07 DIAGNOSIS — M25562 Pain in left knee: Secondary | ICD-10-CM | POA: Diagnosis present

## 2017-04-07 DIAGNOSIS — Z8249 Family history of ischemic heart disease and other diseases of the circulatory system: Secondary | ICD-10-CM | POA: Diagnosis not present

## 2017-04-07 DIAGNOSIS — N400 Enlarged prostate without lower urinary tract symptoms: Secondary | ICD-10-CM | POA: Diagnosis present

## 2017-04-07 DIAGNOSIS — E785 Hyperlipidemia, unspecified: Secondary | ICD-10-CM | POA: Diagnosis present

## 2017-04-07 DIAGNOSIS — Z96651 Presence of right artificial knee joint: Secondary | ICD-10-CM | POA: Diagnosis not present

## 2017-04-07 DIAGNOSIS — Z8261 Family history of arthritis: Secondary | ICD-10-CM | POA: Diagnosis not present

## 2017-04-07 DIAGNOSIS — Z79899 Other long term (current) drug therapy: Secondary | ICD-10-CM

## 2017-04-07 HISTORY — DX: Presence of right artificial knee joint: Z96.651

## 2017-04-07 HISTORY — PX: TOTAL KNEE ARTHROPLASTY: SHX125

## 2017-04-07 HISTORY — DX: Unilateral primary osteoarthritis, left knee: M17.12

## 2017-04-07 SURGERY — ARTHROPLASTY, KNEE, TOTAL
Anesthesia: Spinal | Site: Knee | Laterality: Left

## 2017-04-07 MED ORDER — BUPIVACAINE HCL (PF) 0.25 % IJ SOLN
INTRAMUSCULAR | Status: AC
  Filled 2017-04-07: qty 30

## 2017-04-07 MED ORDER — POTASSIUM CHLORIDE IN NACL 20-0.9 MEQ/L-% IV SOLN
INTRAVENOUS | Status: DC
  Administered 2017-04-08: 01:00:00 via INTRAVENOUS
  Filled 2017-04-07: qty 1000

## 2017-04-07 MED ORDER — 0.9 % SODIUM CHLORIDE (POUR BTL) OPTIME
TOPICAL | Status: DC | PRN
  Administered 2017-04-07: 1000 mL

## 2017-04-07 MED ORDER — ONDANSETRON HCL 4 MG/2ML IJ SOLN
INTRAMUSCULAR | Status: DC | PRN
  Administered 2017-04-07: 4 mg via INTRAVENOUS

## 2017-04-07 MED ORDER — METOCLOPRAMIDE HCL 5 MG/ML IJ SOLN: 5.0000 mg | Freq: Three times a day (TID) | INTRAMUSCULAR | Status: DC | PRN

## 2017-04-07 MED ORDER — SODIUM CHLORIDE 0.9 % IR SOLN
Status: DC | PRN
  Administered 2017-04-07: 3000 mL

## 2017-04-07 MED ORDER — LACTATED RINGERS IV SOLN: INTRAVENOUS | Status: DC

## 2017-04-07 MED ORDER — MIDAZOLAM HCL 2 MG/2ML IJ SOLN
INTRAMUSCULAR | Status: AC
  Administered 2017-04-07: 2 mg via INTRAVENOUS
  Filled 2017-04-07: qty 2

## 2017-04-07 MED ORDER — FAMOTIDINE 20 MG PO TABS
20.0000 mg | ORAL_TABLET | Freq: Two times a day (BID) | ORAL | Status: DC
  Administered 2017-04-07 – 2017-04-08 (×2): 20 mg via ORAL
  Filled 2017-04-07 (×2): qty 1

## 2017-04-07 MED ORDER — DIPHENHYDRAMINE HCL 12.5 MG/5ML PO ELIX: 12.5000 mg | ORAL_SOLUTION | ORAL | Status: DC | PRN

## 2017-04-07 MED ORDER — ONDANSETRON HCL 4 MG/2ML IJ SOLN: 4.0000 mg | Freq: Four times a day (QID) | INTRAMUSCULAR | Status: DC | PRN

## 2017-04-07 MED ORDER — ACETAMINOPHEN 500 MG PO TABS
1000.0000 mg | ORAL_TABLET | Freq: Four times a day (QID) | ORAL | Status: DC
Start: 2017-04-07 — End: 2017-04-08
  Administered 2017-04-07 – 2017-04-08 (×3): 1000 mg via ORAL
  Filled 2017-04-07 (×3): qty 2

## 2017-04-07 MED ORDER — LISINOPRIL 20 MG PO TABS: 20.0000 mg | ORAL_TABLET | Freq: Every day | ORAL | Status: DC

## 2017-04-07 MED ORDER — METOCLOPRAMIDE HCL 5 MG PO TABS
5.0000 mg | ORAL_TABLET | Freq: Three times a day (TID) | ORAL | Status: DC | PRN
  Administered 2017-04-07: 10 mg via ORAL
  Filled 2017-04-07: qty 2

## 2017-04-07 MED ORDER — FENTANYL CITRATE (PF) 100 MCG/2ML IJ SOLN
100.0000 ug | Freq: Once | INTRAMUSCULAR | Status: AC
  Administered 2017-04-07: 100 ug via INTRAVENOUS

## 2017-04-07 MED ORDER — ALUM & MAG HYDROXIDE-SIMETH 200-200-20 MG/5ML PO SUSP
30.0000 mL | ORAL | Status: DC | PRN
  Administered 2017-04-08: 30 mL via ORAL
  Filled 2017-04-07: qty 30

## 2017-04-07 MED ORDER — FENTANYL CITRATE (PF) 250 MCG/5ML IJ SOLN
INTRAMUSCULAR | Status: AC
  Filled 2017-04-07: qty 5

## 2017-04-07 MED ORDER — DEXAMETHASONE SODIUM PHOSPHATE 10 MG/ML IJ SOLN
INTRAMUSCULAR | Status: DC | PRN
  Administered 2017-04-07: 10 mg via INTRAVENOUS

## 2017-04-07 MED ORDER — PHENOL 1.4 % MT LIQD: 1.0000 | OROMUCOSAL | Status: DC | PRN

## 2017-04-07 MED ORDER — MENTHOL 3 MG MT LOZG: 1.0000 | LOZENGE | OROMUCOSAL | Status: DC | PRN

## 2017-04-07 MED ORDER — PROPOFOL 10 MG/ML IV BOLUS
INTRAVENOUS | Status: AC
  Filled 2017-04-07: qty 20

## 2017-04-07 MED ORDER — LORATADINE 10 MG PO TABS
10.0000 mg | ORAL_TABLET | Freq: Every day | ORAL | Status: DC
  Administered 2017-04-08: 10 mg via ORAL
  Filled 2017-04-07: qty 1

## 2017-04-07 MED ORDER — TAMSULOSIN HCL 0.4 MG PO CAPS
0.4000 mg | ORAL_CAPSULE | Freq: Every day | ORAL | Status: DC
  Administered 2017-04-07: 0.4 mg via ORAL
  Filled 2017-04-07: qty 1

## 2017-04-07 MED ORDER — CLOBETASOL PROPIONATE 0.05 % EX OINT: 1.0000 "application " | TOPICAL_OINTMENT | Freq: Every day | CUTANEOUS | Status: DC | PRN

## 2017-04-07 MED ORDER — LIDOCAINE HCL (CARDIAC) 20 MG/ML IV SOLN
INTRAVENOUS | Status: DC | PRN
  Administered 2017-04-07: 60 mg via INTRAVENOUS

## 2017-04-07 MED ORDER — FENTANYL CITRATE (PF) 100 MCG/2ML IJ SOLN
INTRAMUSCULAR | Status: AC
  Administered 2017-04-07: 100 ug via INTRAVENOUS
  Filled 2017-04-07: qty 2

## 2017-04-07 MED ORDER — HYDROMORPHONE HCL 1 MG/ML IJ SOLN
0.2500 mg | INTRAMUSCULAR | Status: DC | PRN
  Administered 2017-04-07 (×4): 0.5 mg via INTRAVENOUS

## 2017-04-07 MED ORDER — ONDANSETRON HCL 4 MG/2ML IJ SOLN
INTRAMUSCULAR | Status: AC
  Filled 2017-04-07: qty 2

## 2017-04-07 MED ORDER — ACETAMINOPHEN 650 MG RE SUPP: 650.0000 mg | Freq: Four times a day (QID) | RECTAL | Status: DC | PRN

## 2017-04-07 MED ORDER — HYDROMORPHONE HCL 1 MG/ML IJ SOLN
INTRAMUSCULAR | Status: AC
  Filled 2017-04-07: qty 0.5

## 2017-04-07 MED ORDER — APIXABAN 2.5 MG PO TABS
2.5000 mg | ORAL_TABLET | Freq: Two times a day (BID) | ORAL | Status: DC
  Administered 2017-04-08: 2.5 mg via ORAL
  Filled 2017-04-07: qty 1

## 2017-04-07 MED ORDER — ROSUVASTATIN CALCIUM 10 MG PO TABS
20.0000 mg | ORAL_TABLET | Freq: Every day | ORAL | Status: DC
  Administered 2017-04-07: 20 mg via ORAL
  Filled 2017-04-07: qty 2

## 2017-04-07 MED ORDER — PROPOFOL 1000 MG/100ML IV EMUL
INTRAVENOUS | Status: AC
  Filled 2017-04-07: qty 200

## 2017-04-07 MED ORDER — FENTANYL CITRATE (PF) 100 MCG/2ML IJ SOLN
INTRAMUSCULAR | Status: DC | PRN
  Administered 2017-04-07: 50 ug via INTRAVENOUS
  Administered 2017-04-07: 100 ug via INTRAVENOUS
  Administered 2017-04-07 (×2): 50 ug via INTRAVENOUS

## 2017-04-07 MED ORDER — OXYCODONE HCL 5 MG PO TABS
5.0000 mg | ORAL_TABLET | ORAL | Status: DC | PRN
  Administered 2017-04-07 – 2017-04-08 (×6): 10 mg via ORAL
  Filled 2017-04-07 (×6): qty 2

## 2017-04-07 MED ORDER — LEVOTHYROXINE SODIUM 100 MCG PO TABS
300.0000 ug | ORAL_TABLET | Freq: Every day | ORAL | Status: DC
  Administered 2017-04-08: 300 ug via ORAL
  Filled 2017-04-07: qty 3

## 2017-04-07 MED ORDER — HYDROMORPHONE HCL 1 MG/ML IJ SOLN
1.0000 mg | INTRAMUSCULAR | Status: DC | PRN
  Administered 2017-04-07 – 2017-04-08 (×3): 1 mg via INTRAVENOUS
  Filled 2017-04-07 (×3): qty 1

## 2017-04-07 MED ORDER — VANCOMYCIN HCL IN DEXTROSE 1-5 GM/200ML-% IV SOLN
1000.0000 mg | Freq: Two times a day (BID) | INTRAVENOUS | Status: AC
  Administered 2017-04-07: 1000 mg via INTRAVENOUS
  Filled 2017-04-07: qty 200

## 2017-04-07 MED ORDER — POLYETHYLENE GLYCOL 3350 17 G PO PACK
17.0000 g | PACK | Freq: Two times a day (BID) | ORAL | Status: DC
  Administered 2017-04-07 – 2017-04-08 (×2): 17 g via ORAL
  Filled 2017-04-07 (×2): qty 1

## 2017-04-07 MED ORDER — ACETAMINOPHEN 325 MG PO TABS: 650.0000 mg | ORAL_TABLET | Freq: Four times a day (QID) | ORAL | Status: DC | PRN

## 2017-04-07 MED ORDER — LACTATED RINGERS IV SOLN
INTRAVENOUS | Status: DC
  Administered 2017-04-07 (×3): via INTRAVENOUS

## 2017-04-07 MED ORDER — MIDAZOLAM HCL 5 MG/5ML IJ SOLN
INTRAMUSCULAR | Status: DC | PRN
  Administered 2017-04-07: 2 mg via INTRAVENOUS

## 2017-04-07 MED ORDER — NEBIVOLOL HCL 5 MG PO TABS
10.0000 mg | ORAL_TABLET | Freq: Every day | ORAL | Status: DC
  Administered 2017-04-08: 10 mg via ORAL
  Filled 2017-04-07: qty 2

## 2017-04-07 MED ORDER — MIDAZOLAM HCL 2 MG/2ML IJ SOLN
2.0000 mg | Freq: Once | INTRAMUSCULAR | Status: AC
  Administered 2017-04-07: 2 mg via INTRAVENOUS

## 2017-04-07 MED ORDER — EPINEPHRINE PF 1 MG/ML IJ SOLN
INTRAMUSCULAR | Status: AC
  Filled 2017-04-07: qty 1

## 2017-04-07 MED ORDER — MIDAZOLAM HCL 2 MG/2ML IJ SOLN
INTRAMUSCULAR | Status: AC
  Filled 2017-04-07: qty 2

## 2017-04-07 MED ORDER — DEXAMETHASONE SODIUM PHOSPHATE 10 MG/ML IJ SOLN
10.0000 mg | Freq: Three times a day (TID) | INTRAMUSCULAR | Status: DC
  Administered 2017-04-07 – 2017-04-08 (×3): 10 mg via INTRAVENOUS
  Filled 2017-04-07 (×3): qty 1

## 2017-04-07 MED ORDER — ONDANSETRON HCL 4 MG PO TABS: 4.0000 mg | ORAL_TABLET | Freq: Four times a day (QID) | ORAL | Status: DC | PRN

## 2017-04-07 MED ORDER — POVIDONE-IODINE 7.5 % EX SOLN: Freq: Once | CUTANEOUS | Status: DC

## 2017-04-07 MED ORDER — PROPOFOL 500 MG/50ML IV EMUL
INTRAVENOUS | Status: DC | PRN
  Administered 2017-04-07: 75 ug/kg/min via INTRAVENOUS

## 2017-04-07 MED ORDER — BUPIVACAINE-EPINEPHRINE 0.25% -1:200000 IJ SOLN
INTRAMUSCULAR | Status: DC | PRN
  Administered 2017-04-07: 30 mL

## 2017-04-07 MED ORDER — PROPOFOL 10 MG/ML IV BOLUS
INTRAVENOUS | Status: DC | PRN
  Administered 2017-04-07: 30 mg via INTRAVENOUS
  Administered 2017-04-07: 200 mg via INTRAVENOUS
  Administered 2017-04-07 (×2): 30 mg via INTRAVENOUS

## 2017-04-07 MED ORDER — DOCUSATE SODIUM 100 MG PO CAPS
100.0000 mg | ORAL_CAPSULE | Freq: Two times a day (BID) | ORAL | Status: DC
  Administered 2017-04-07 – 2017-04-08 (×2): 100 mg via ORAL
  Filled 2017-04-07 (×2): qty 1

## 2017-04-07 MED ORDER — GLYCOPYRROLATE 0.2 MG/ML IJ SOLN
INTRAMUSCULAR | Status: DC | PRN
  Administered 2017-04-07: 0.1 mg via INTRAVENOUS

## 2017-04-07 SURGICAL SUPPLY — 70 items
BANDAGE ESMARK 6X9 LF (GAUZE/BANDAGES/DRESSINGS) ×1 IMPLANT
BENZOIN TINCTURE PRP APPL 2/3 (GAUZE/BANDAGES/DRESSINGS) ×2 IMPLANT
BLADE SAGITTAL 25.0X1.19X90 (BLADE) ×2 IMPLANT
BLADE SAW SGTL 13X75X1.27 (BLADE) ×2 IMPLANT
BLADE SURG 10 STRL SS (BLADE) ×4 IMPLANT
BNDG ELASTIC 6X15 VLCR STRL LF (GAUZE/BANDAGES/DRESSINGS) ×2 IMPLANT
BNDG ESMARK 6X9 LF (GAUZE/BANDAGES/DRESSINGS) ×2
BOWL SMART MIX CTS (DISPOSABLE) ×2 IMPLANT
CAP KNEE TOTAL 3 SIGMA ×2 IMPLANT
CEMENT HV SMART SET (Cement) ×4 IMPLANT
CLSR STERI-STRIP ANTIMIC 1/2X4 (GAUZE/BANDAGES/DRESSINGS) ×2 IMPLANT
COVER SURGICAL LIGHT HANDLE (MISCELLANEOUS) ×2 IMPLANT
CUFF TOURNIQUET SINGLE 34IN LL (TOURNIQUET CUFF) ×2 IMPLANT
CUFF TOURNIQUET SINGLE 44IN (TOURNIQUET CUFF) IMPLANT
DECANTER SPIKE VIAL GLASS SM (MISCELLANEOUS) ×2 IMPLANT
DRAPE EXTREMITY T 121X128X90 (DRAPE) ×2 IMPLANT
DRAPE HALF SHEET 40X57 (DRAPES) ×4 IMPLANT
DRAPE INCISE IOBAN 66X45 STRL (DRAPES) IMPLANT
DRAPE U-SHAPE 47X51 STRL (DRAPES) ×2 IMPLANT
DRSG AQUACEL AG ADV 3.5X14 (GAUZE/BANDAGES/DRESSINGS) ×2 IMPLANT
DURAPREP 26ML APPLICATOR (WOUND CARE) ×4 IMPLANT
ELECT CAUTERY BLADE 6.4 (BLADE) ×2 IMPLANT
ELECT REM PT RETURN 9FT ADLT (ELECTROSURGICAL) ×2
ELECTRODE REM PT RTRN 9FT ADLT (ELECTROSURGICAL) ×1 IMPLANT
FACESHIELD WRAPAROUND (MASK) ×2 IMPLANT
GLOVE BIO SURGEON STRL SZ7 (GLOVE) ×2 IMPLANT
GLOVE BIOGEL PI IND STRL 7.0 (GLOVE) ×1 IMPLANT
GLOVE BIOGEL PI IND STRL 7.5 (GLOVE) ×1 IMPLANT
GLOVE BIOGEL PI INDICATOR 7.0 (GLOVE) ×1
GLOVE BIOGEL PI INDICATOR 7.5 (GLOVE) ×1
GLOVE SS BIOGEL STRL SZ 7.5 (GLOVE) ×1 IMPLANT
GLOVE SUPERSENSE BIOGEL SZ 7.5 (GLOVE) ×1
GOWN STRL REUS W/ TWL LRG LVL3 (GOWN DISPOSABLE) ×1 IMPLANT
GOWN STRL REUS W/ TWL XL LVL3 (GOWN DISPOSABLE) ×2 IMPLANT
GOWN STRL REUS W/TWL LRG LVL3 (GOWN DISPOSABLE) ×1
GOWN STRL REUS W/TWL XL LVL3 (GOWN DISPOSABLE) ×2
HANDPIECE INTERPULSE COAX TIP (DISPOSABLE) ×1
HOOD PEEL AWAY FACE SHEILD DIS (HOOD) ×4 IMPLANT
IMMOBILIZER KNEE 22 (SOFTGOODS) ×2 IMPLANT
IMMOBILIZER KNEE 22 UNIV (SOFTGOODS) ×2 IMPLANT
KIT BASIN OR (CUSTOM PROCEDURE TRAY) ×2 IMPLANT
KIT ROOM TURNOVER OR (KITS) ×2 IMPLANT
MANIFOLD NEPTUNE II (INSTRUMENTS) ×2 IMPLANT
MARKER SKIN DUAL TIP RULER LAB (MISCELLANEOUS) ×2 IMPLANT
NEEDLE 18GX1X1/2 (RX/OR ONLY) (NEEDLE) ×4 IMPLANT
NEEDLE FILTER BLUNT 18X 1/2SAF (NEEDLE) ×1
NEEDLE FILTER BLUNT 18X1 1/2 (NEEDLE) ×1 IMPLANT
NS IRRIG 1000ML POUR BTL (IV SOLUTION) ×2 IMPLANT
PACK TOTAL JOINT (CUSTOM PROCEDURE TRAY) ×2 IMPLANT
PAD ARMBOARD 7.5X6 YLW CONV (MISCELLANEOUS) ×4 IMPLANT
SET HNDPC FAN SPRY TIP SCT (DISPOSABLE) ×1 IMPLANT
STRIP CLOSURE SKIN 1/2X4 (GAUZE/BANDAGES/DRESSINGS) ×2 IMPLANT
SUCTION FRAZIER HANDLE 10FR (MISCELLANEOUS) ×2
SUCTION TUBE FRAZIER 10FR DISP (MISCELLANEOUS) ×2 IMPLANT
SUT MNCRL AB 3-0 PS2 18 (SUTURE) ×2 IMPLANT
SUT VIC AB 0 CT1 27 (SUTURE) ×3
SUT VIC AB 0 CT1 27XBRD ANBCTR (SUTURE) ×3 IMPLANT
SUT VIC AB 1 CT1 27 (SUTURE) ×1
SUT VIC AB 1 CT1 27XBRD ANBCTR (SUTURE) ×1 IMPLANT
SUT VIC AB 2-0 CT1 27 (SUTURE) ×2
SUT VIC AB 2-0 CT1 TAPERPNT 27 (SUTURE) ×2 IMPLANT
SYR 30ML LL (SYRINGE) ×2 IMPLANT
SYR TB 1ML LUER SLIP (SYRINGE) ×2 IMPLANT
TOWEL OR 17X24 6PK STRL BLUE (TOWEL DISPOSABLE) ×2 IMPLANT
TOWEL OR 17X26 10 PK STRL BLUE (TOWEL DISPOSABLE) ×2 IMPLANT
TRAY CATH 16FR W/PLASTIC CATH (SET/KITS/TRAYS/PACK) IMPLANT
TRAY FOLEY CATH 14FRSI W/METER (CATHETERS) ×2 IMPLANT
TRAY FOLEY CATH SILVER 16FR (SET/KITS/TRAYS/PACK) IMPLANT
TUBE CONNECTING 12X1/4 (SUCTIONS) ×2 IMPLANT
YANKAUER SUCT BULB TIP NO VENT (SUCTIONS) ×2 IMPLANT

## 2017-04-07 NOTE — Anesthesia Procedure Notes (Signed)
Procedure Name: LMA Insertion Date/Time: 04/07/2017 10:12 AM Performed by: Coralee Rud Pre-anesthesia Checklist: Patient identified, Emergency Drugs available, Suction available and Patient being monitored Patient Re-evaluated:Patient Re-evaluated prior to inductionOxygen Delivery Method: Circle system utilized Preoxygenation: Pre-oxygenation with 100% oxygen Intubation Type: IV induction Ventilation: Mask ventilation without difficulty LMA Size: 5.0 Number of attempts: 1 Placement Confirmation: positive ETCO2 and breath sounds checked- equal and bilateral Tube secured with: Tape Dental Injury: Teeth and Oropharynx as per pre-operative assessment

## 2017-04-07 NOTE — Progress Notes (Signed)
Pt admitted to the unit from pacu; pt A&O x4; IV intact and transfusing; LLE ace wrap dsg to incision remains clean, dry and intact with no stain noted. LLE remains in CPM; scd's on; VSS; pt oriented to the unit and room; fall/safety precaution and prevention education completed. Pt's skin intact except for surgical incision dsg to LLE. No pressure ulcers or open wounds noted. Call light within reach and will continue to closely monitor pt. Dionne Bucy RN

## 2017-04-07 NOTE — Anesthesia Postprocedure Evaluation (Signed)
Anesthesia Post Note  Patient: Bryan [name]  Procedure(s) Performed: Procedure(s) (LRB): LEFT TOTAL KNEE ARTHROPLASTY (Left)  Patient location during evaluation: PACU Anesthesia Type: General Level of consciousness: awake Pain management: pain level controlled Vital Signs Assessment: post-procedure vital signs reviewed and stable Cardiovascular status: stable Anesthetic complications: no       Last Vitals:  Vitals:   04/07/17 1230 04/07/17 1241  BP:  (!) 141/83  Pulse: 78 78  Resp: 16 11  Temp:      Last Pain:  Vitals:   04/07/17 1230  TempSrc:   PainSc: 10-Worst pain ever                 Rionna Feltes

## 2017-04-07 NOTE — Anesthesia Procedure Notes (Signed)
Spinal  Patient location during procedure: OR Start time: 04/07/2017 9:30 AM End time: 04/07/2017 9:45 AM Staffing Anesthesiologist: Dorris Singh Performed: anesthesiologist  Preanesthetic Checklist Completed: patient identified, site marked, surgical consent, pre-op evaluation, timeout performed, IV checked, risks and benefits discussed and monitors and equipment checked Spinal Block Prep: DuraPrep Location: L3-4 Injection technique: single-shot Needle Needle type: Quincke  Needle gauge: 22 G Assessment Sensory level: T12 Additional Notes Clear CSF neg heme attempt 24G then 22G. Spinal Marcaine 15mg . Pt tolerated procedure well.

## 2017-04-07 NOTE — Evaluation (Signed)
Physical Therapy Evaluation Patient Details Name: Bryan [name] MRN: [id number] DOB: [birth year] Today's Date: 04/07/2017   History of Present Illness  51 yo male s/p L TKA. Had R TKA 6 weeks ago. PMH. HTN and bells palsy  Clinical Impression  OOB mobility limited to EOB due to pt with R LE numbness and inability to feel R LE > than L LE. Reported to RN once block wears off to try to transfer to chair later this evening. Anticipate pt to progress well as he recently had his R TKA done. Acute PT to follow.    Follow Up Recommendations Home health PT;Supervision/Assistance - 24 hour    Equipment Recommendations  None recommended by PT    Recommendations for Other Services       Precautions / Restrictions Precautions Precautions: Fall;Knee Precaution Booklet Issued: Yes (comment) Precaution Comments: handout provided Required Braces or Orthoses: Knee Immobilizer - Left Knee Immobilizer - Left: On when out of bed or walking Restrictions Weight Bearing Restrictions: Yes LLE Weight Bearing: Weight bearing as tolerated      Mobility  Bed Mobility Overal bed mobility: Needs Assistance Bed Mobility: Supine to Sit;Sit to Supine     Supine to sit: Min guard Sit to supine: Supervision;Min guard   General bed mobility comments: pt with good long sit technqiue and able to manage bilat LEs in/out off bed without assist  Transfers                 General transfer comment: deferred due to pt unable to feel R LE, which would be the "strong" leg, which underwent TKA 6 weeks ago  Ambulation/Gait                Stairs            Wheelchair Mobility    Modified Rankin (Stroke Patients Only)       Balance Overall balance assessment: No apparent balance deficits (not formally assessed) (able to sit EOB without difficulty)                                           Pertinent Vitals/Pain Pain Assessment: 0-10 Pain Score: 4  Pain Location:  L knee Pain Descriptors / Indicators: Numbness    Home Living Family/patient expects to be discharged to:: Private residence Living Arrangements: Spouse/significant other Available Help at Discharge: Family;Available 24 hours/day Type of Home: House Home Access: Stairs to enter Entrance Stairs-Rails: None Entrance Stairs-Number of Steps: 4 Home Layout: One level Home Equipment: Walker - 2 wheels;Bedside commode Additional Comments: CPM    Prior Function Level of Independence: Independent         Comments: was undergoing outpt PT for R TKA, driving     Hand Dominance   Dominant Hand: Right    Extremity/Trunk Assessment   Upper Extremity Assessment Upper Extremity Assessment: Overall WFL for tasks assessed    Lower Extremity Assessment Lower Extremity Assessment: RLE deficits/detail;LLE deficits/detail RLE Deficits / Details: pt with good ROM however number and reports not feeling foot or buttocks LLE Deficits / Details: able to initiate quad set and ankle pump and hip/knee flexion however reports numbness LLE Sensation: decreased light touch    Cervical / Trunk Assessment Cervical / Trunk Assessment: Normal  Communication   Communication: No difficulties  Cognition Arousal/Alertness: Awake/alert Behavior During Therapy: WFL for tasks assessed/performed Overall Cognitive Status: Within Functional  Limits for tasks assessed                                        General Comments General comments (skin integrity, edema, etc.): pt returned to bed and placed in bone foam    Exercises     Assessment/Plan    PT Assessment Patient needs continued PT services  PT Problem List Decreased strength;Decreased range of motion;Decreased activity tolerance;Decreased balance;Decreased mobility;Impaired sensation;Pain;Obesity       PT Treatment Interventions DME instruction;Gait training;Stair training;Functional mobility training;Therapeutic  activities;Therapeutic exercise;Balance training    PT Goals (Current goals can be found in the Care Plan section)  Acute Rehab PT Goals Patient Stated Goal: home PT Goal Formulation: With patient Time For Goal Achievement: 04/14/17 Potential to Achieve Goals: Good    Frequency 7X/week   Barriers to discharge        Co-evaluation               End of Session   Activity Tolerance: Patient tolerated treatment well Patient left: in bed;with call bell/phone within reach;with family/visitor present Nurse Communication: Mobility status (bilat LE numbness from tap) PT Visit Diagnosis: Muscle weakness (generalized) (M62.81);Difficulty in walking, not elsewhere classified (R26.2)    Time: 0131-4388 PT Time Calculation (min) (ACUTE ONLY): 27 min   Charges:   PT Evaluation $PT Eval Moderate Complexity: 1 Procedure PT Treatments $Therapeutic Activity: 8-22 mins   PT G Codes:          Bryan [name], PT, DPT Pager #: [phone] Office #: [phone]   Bryan [name] 04/07/2017, 4:19 PM

## 2017-04-07 NOTE — Transfer of Care (Signed)
Immediate Anesthesia Transfer of Care Note  Patient: Bryan [name]  Procedure(s) Performed: Procedure(s): LEFT TOTAL KNEE ARTHROPLASTY (Left)  Patient Location: PACU  Anesthesia Type:General and Regional  Level of Consciousness: awake, alert , oriented and patient cooperative  Airway & Oxygen Therapy: Patient Spontanous Breathing and Patient connected to face mask oxygen  Post-op Assessment: Report given to RN, Post -op Vital signs reviewed and stable and Patient moving all extremities  Post vital signs: Reviewed and stable  Last Vitals:  Vitals:   03/25/17 0900 04/07/17 0815  BP: 131/80 125/65  Pulse: 79 82  Resp:  20  Temp: 36.4 C 36.5 C    Last Pain:  Vitals:   04/07/17 0815  TempSrc: Oral      Patients Stated Pain Goal: 3 (04/07/17 0844)  Complications: No apparent anesthesia complications

## 2017-04-07 NOTE — Anesthesia Preprocedure Evaluation (Addendum)
Anesthesia Evaluation  Patient identified by MRN, date of birth, ID band Patient awake    Reviewed: Allergy & Precautions, NPO status , Patient's Chart, lab work & pertinent test results  Airway Mallampati: II  TM Distance: <3 FB     Dental   Pulmonary sleep apnea ,    breath sounds clear to auscultation       Cardiovascular hypertension,  Rhythm:Regular Rate:Normal     Neuro/Psych  Neuromuscular disease    GI/Hepatic Neg liver ROS, GERD  ,  Endo/Other  Hypothyroidism   Renal/GU negative Renal ROS     Musculoskeletal  (+) Arthritis ,   Abdominal   Peds  Hematology   Anesthesia Other Findings   Reproductive/Obstetrics                            Anesthesia Physical Anesthesia Plan  ASA: III  Anesthesia Plan: General   Post-op Pain Management:    Induction: Intravenous  Airway Management Planned: Simple Face Mask  Additional Equipment:   Intra-op Plan:   Post-operative Plan:   Informed Consent:   Dental advisory given  Plan Discussed with: Anesthesiologist and CRNA  Anesthesia Plan Comments:        Anesthesia Quick Evaluation

## 2017-04-07 NOTE — Anesthesia Postprocedure Evaluation (Signed)
Anesthesia Post Note  Patient: Bryan [name]  Procedure(s) Performed: Procedure(s) (LRB): LEFT TOTAL KNEE ARTHROPLASTY (Left)  Patient location during evaluation: PACU Anesthesia Type: General Level of consciousness: awake Pain management: pain level controlled Respiratory status: spontaneous breathing Cardiovascular status: stable Anesthetic complications: no       Last Vitals:  Vitals:   04/07/17 1230 04/07/17 1241  BP:  (!) 141/83  Pulse: 78 78  Resp: 16 11  Temp:      Last Pain:  Vitals:   04/07/17 1230  TempSrc:   PainSc: 10-Worst pain ever                 Tonianne Fine

## 2017-04-07 NOTE — Progress Notes (Signed)
Orthopedic Tech Progress Note Patient Details:  LOY MARETT [birth year] [id number]  Ortho Devices Ortho Device/Splint Location: applied cpm to pt left knee/leg at 0-90 degrees.  Pt tolerated well.  provided bone foam zero degree at bedside.  applied over head frame with trapeze.  Ortho Device/Splint Interventions: Application, Adjustment   Alvina Chou 04/07/2017, 12:56 PM

## 2017-04-07 NOTE — Op Note (Signed)
MRN:     [id number] DOB/AGE:    [birth year] / 51 y.o.       OPERATIVE REPORT    DATE OF PROCEDURE:  04/07/2017       PREOPERATIVE DIAGNOSIS:  PRIMARY LOCALIZED OSTEROARTHRITIS LEFT KNEE      Estimated body mass index is 39.16 kg/m as calculated from the following:   Height as of this encounter: 6\' 2"  (1.88 m).   Weight as of this encounter: 138.3 kg (305 lb).                                                        POSTOPERATIVE DIAGNOSIS:   SAME                                                                      PROCEDURE:  Procedure(s): LEFT TOTAL KNEE ARTHROPLASTY Using Depuy Sigma RP implants #5 Femur, #5Tibia, 12.76mm  RP bearing, 35 Patella     SURGEON: Alecxis Baltzell [name]    ASSISTANT:  Kirstin Shepperson PA-C   (Present and scrubbed throughout the case, critical for assistance with exposure, retraction, instrumentation, and closure.)         ANESTHESIA: Spinal/GET with Adductor Nerve Block     TOURNIQUET TIME:   COMPLICATIONS:  None     SPECIMENS: None   INDICATIONS FOR PROCEDURE: The patient has  OSTEROARTHRITIS LEFT KNEE, varus deformities, XR shows bone on bone arthritis. Patient has failed all conservative measures including anti-inflammatory medicines, narcotics, attempts at  exercise and weight loss, cortisone injections and viscosupplementation.  Risks and benefits of surgery have been discussed, questions answered.   DESCRIPTION OF PROCEDURE: The patient identified by armband, received  right femoral nerve block and IV antibiotics, in the holding area at [facility]. Patient taken to the operating room, appropriate anesthetic  monitors were attached Spinal then General endotracheal anesthesia induced with  the patient in supine position, Foley catheter was inserted. Tourniquet  applied high to the operative thigh. Lateral post and foot positioner  applied to the table, the lower extremity was then prepped and draped  in usual sterile fashion from the ankle  to the tourniquet. Time-out procedure was performed. The limb was wrapped with an Esmarch bandage and the tourniquet inflated to 365 mmHg. We began the operation by making the anterior midline incision starting at handbreadth above the patella going over the patella 1 cm medial to and  4 cm distal to the tibial tubercle. Small bleeders in the skin and the  subcutaneous tissue identified and cauterized. Transverse retinaculum was incised and reflected medially and [name] medial parapatellar arthrotomy was accomplished. the patella was everted and theprepatellar fat pad resected. The superficial medial collateral  ligament was then elevated from anterior to posterior along the proximal  flare of the tibia and anterior half of the menisci resected. The knee was hyperflexed exposing bone on bone arthritis. Peripheral and notch osteophytes as well as the cruciate ligaments were then resected. We continued to  work our way around posteriorly along the proximal tibia, and externally  rotated the tibia  subluxing it out from underneath the femur. [name] McHale  retractor was placed through the notch and [name] lateral Hohmann retractor  placed, and we then drilled through the proximal tibia in line with the  axis of the tibia followed by an intramedullary guide rod and 2-degree  posterior slope cutting guide. The tibial cutting guide was pinned into place  allowing resection of 4 mm of bone medially and about 6 mm of bone  laterally because of her varus deformity. Satisfied with the tibial resection, we then  entered the distal femur 2 mm anterior to the PCL origin with the  intramedullary guide rod and applied the distal femoral cutting guide  set at 11mm, with 5 degrees of valgus. This was pinned along the  epicondylar axis. At this point, the distal femoral cut was accomplished without difficulty. We then sized for [name] #5 femoral component and pinned the guide in 3 degrees of external rotation.The chamfer cutting guide was  pinned into place. The anterior, posterior, and chamfer cuts were accomplished without difficulty followed by  the  RP box cutting guide and the box cut. We also removed posterior osteophytes from the posterior femoral condyles. At this  time, the knee was brought into full extension. We checked our  extension and flexion gaps and found them symmetric at 12.10mm.  The patella thickness measured at 25 mm. We set the cutting guide at 15 and removed the posterior 9.5-10 mm  of the patella sized for 35 button and drilled the lollipop. The knee  was then once again hyperflexed exposing the proximal tibia. We sized for [name] #5 tibial base plate, applied the smokestack and the conical reamer followed by the the Delta fin keel punch. We then hammered into place the  RP trial femoral component, inserted [name] 1 trial bearing, trial patellar button, and took the knee through range of motion from 0-130 degrees. No thumb pressure was required for patellar  tracking. At this point, all trial components were removed, [name] double batch of DePuy HV cement  was mixed and applied to all bony metallic mating surfaces except for the posterior condyles of the femur itself. In order, we  hammered into place the tibial tray and removed excess cement, the femoral component and removed excess cement, [name] 12.48mm  RP bearing  was inserted, and the knee brought to full extension with compression.  The patellar button was clamped into place, and excess cement  removed. While the cement cured the wound was irrigated out with normal saline solution pulse lavage.. Ligament stability and patellar tracking were checked and found to be excellent.. The parapatellar arthrotomy was closed with  #1 Vicryl suture. The subcutaneous tissue with 0 and 2-0 undyed  Vicryl suture, and 4-0 Monocryl.. [name] dressing of Aquaseal,  4 x 4, dressing sponges, Webril, and Ace wrap applied. Needle and sponge count were correct times 2.The patient awakened, extubated, and  taken to recovery room without difficulty. Vascular status was normal, pulses 2+ and symmetric.   Bryan [name] 04/07/2017, 11:38 AM

## 2017-04-07 NOTE — Interval H&P Note (Signed)
History and Physical Interval Note:  04/07/2017 8:47 AM  Bryan [name]  has presented today for surgery, with the diagnosis of OA LEFT KNEE  The various methods of treatment have been discussed with the patient and family. After consideration of risks, benefits and other options for treatment, the patient has consented to  Procedure(s): LEFT TOTAL KNEE ARTHROPLASTY (Left) as a surgical intervention .  The patient's history has been reviewed, patient examined, no change in status, stable for surgery.  I have reviewed the patient's chart and labs.  Questions were answered to the patient's satisfaction.     Salvatore Marvel A

## 2017-04-07 NOTE — Progress Notes (Signed)
Pt foley removed upon pt's request and order; pt due to void. Will closely monitor. Dionne Bucy RN

## 2017-04-08 ENCOUNTER — Encounter (HOSPITAL_COMMUNITY): Payer: Self-pay | Admitting: Orthopedic Surgery

## 2017-04-08 LAB — BASIC METABOLIC PANEL
Anion gap: 7 (ref 5–15)
BUN: 9 mg/dL (ref 6–20)
CALCIUM: 8.9 mg/dL (ref 8.9–10.3)
CO2: 27 mmol/L (ref 22–32)
CREATININE: 0.89 mg/dL (ref 0.61–1.24)
Chloride: 102 mmol/L (ref 101–111)
GFR calc non Af Amer: >60 mL/min (ref >60)
GLUCOSE: 162 mg/dL — AB (ref 65–99)
Potassium: 4 mmol/L (ref 3.5–5.1)
Sodium: 136 mmol/L (ref 135–145)

## 2017-04-08 LAB — CBC
HEMATOCRIT: 38.4 % — AB (ref 39.0–52.0)
Hemoglobin: 12.9 g/dL — ABNORMAL LOW (ref 13.0–17.0)
MCH: 29.9 pg (ref 26.0–34.0)
MCHC: 33.6 g/dL (ref 30.0–36.0)
MCV: 89.1 fL (ref 78.0–100.0)
Platelets: 254 10*3/uL (ref 150–400)
RBC: 4.31 MIL/uL (ref 4.22–5.81)
RDW: 13.2 % (ref 11.5–15.5)
WBC: 12.8 10*3/uL — ABNORMAL HIGH (ref 4.0–10.5)

## 2017-04-08 MED ORDER — OXYCODONE HCL 5 MG PO TABS
ORAL_TABLET | ORAL | 0 refills | Status: DC
Start: 2017-04-08 — End: 2018-09-02

## 2017-04-08 MED ORDER — POLYETHYLENE GLYCOL 3350 17 G PO PACK: PACK | ORAL | 0 refills | Status: DC

## 2017-04-08 MED ORDER — DOCUSATE SODIUM 100 MG PO CAPS: ORAL_CAPSULE | ORAL | 0 refills | Status: DC

## 2017-04-08 NOTE — Discharge Summary (Signed)
Patient ID: Bryan [name] MRN: [id number] DOB/AGE: [birth year] 51 y.o.  Admit date: 04/07/2017 Discharge date: 04/08/2017  Admission Diagnoses:  Principal Problem:   Primary localized osteoarthritis of left knee Active Problems:   Obstructive sleep apnea   Hypothyroidism   Hypertension   Vitamin D deficiency   Hyperlipidemia   S/P total knee arthroplasty, right   Discharge Diagnoses:  Same  Past Medical History:  Diagnosis Date  . Allergy    takes Claritin daily  . Elevated cholesterol    takes Crestor daily  . Enlarged prostate    takes Flomax nightly  . GERD (gastroesophageal reflux disease)    takes Zantac daily  . Hypertension    takes Lisinopril and Bystolic daily  . Hypothyroidism    takes Synthroid daily  . Neuromuscular disorder (HCC)    bells palsy  . OSA (obstructive sleep apnea)    uses CPAP  . Primary localized osteoarthritis of left knee 03/25/2017  . Primary localized osteoarthritis of right knee   . S/P total knee arthroplasty, right 03/25/2017  . Vitamin D deficiency     Surgeries: Procedure(s): LEFT TOTAL KNEE ARTHROPLASTY on 04/07/2017   Consultants:   Discharged Condition: Improved  Hospital Course: Bryan [name] is an 51 y.o. male who was admitted 04/07/2017 for operative treatment ofPrimary localized osteoarthritis of left knee. Patient has severe unremitting pain that affects sleep, daily activities, and work/hobbies. After pre-op clearance the patient was taken to the operating room on 04/07/2017 and underwent  Procedure(s): LEFT TOTAL KNEE ARTHROPLASTY.    Patient was given perioperative antibiotics: Anti-infectives    Start     Dose/Rate Route Frequency Ordered Stop   04/07/17 2200  vancomycin (VANCOCIN) IVPB 1000 mg/200 mL premix     1,000 mg 200 mL/hr over 60 Minutes Intravenous Every 12 hours 04/07/17 1422 04/07/17 2324   04/07/17 0845  vancomycin (VANCOCIN) 1,500 mg in sodium chloride 0.9 % 500 mL IVPB     1,500 mg 250  mL/hr over 120 Minutes Intravenous On call to O.R. 04/04/17 1327 04/07/17 1037       Patient was given sequential compression devices, early ambulation, and chemoprophylaxis to prevent DVT.  Patient benefited maximally from hospital stay and there were no complications.    Recent vital signs: Patient Vitals for the past 24 hrs:  BP Temp Temp src Pulse Resp SpO2 Height  04/08/17 0414 137/68 98 F (36.7 C) Oral 82 18 97 % -  04/07/17 2344 133/67 98.3 F (36.8 C) Oral 79 18 96 % -  04/07/17 1429 138/64 98.7 F (37.1 C) - 85 18 93 % -  04/07/17 1400 - 98.4 F (36.9 C) - 82 16 98 % -  04/07/17 1356 115/67 - - 84 17 96 % -  04/07/17 1345 - - - 86 20 95 % -  04/07/17 1341 135/76 - - 81 15 97 % -  04/07/17 1330 - - - 85 19 97 % -  04/07/17 1326 135/81 - - 83 16 99 % -  04/07/17 1315 - - - 82 20 97 % -  04/07/17 1311 136/77 - - 81 15 96 % -  04/07/17 1300 - - - 78 15 99 % -  04/07/17 1256 138/83 - - 78 13 99 % -  04/07/17 1245 - - - 80 13 100 % -  04/07/17 1241 (!) 141/83 - - 78 11 100 % -  04/07/17 1230 - - - 78 16 100 % -  04/07/17 1226 (!)  123/99 - - 73 14 100 % -  04/07/17 1215 - - - 79 20 100 % -  04/07/17 1211 123/66 97.8 F (36.6 C) - 80 (!) 9 100 % -  04/07/17 0855 - - - - - - 6\' 2"  (1.88 m)     Recent laboratory studies:  Recent Labs  04/08/17 0458  WBC 12.8*  HGB 12.9*  HCT 38.4*  PLT 254  NA 136  K 4.0  CL 102  CO2 27  BUN 9  CREATININE 0.89  GLUCOSE 162*  CALCIUM 8.9     Discharge Medications:   Allergies as of 04/08/2017      Reactions   Lipitor [atorvastatin Calcium] Other (See Comments)   MYALGIAS Muscle aches.   Penicillins Hives, Swelling   SWELLING REACTION UNSPECIFIED  Has patient had a PCN reaction causing immediate rash, facial/tongue/throat swelling, SOB or lightheadedness with hypotension:unsure Has patient had a PCN reaction causing severe rash involving mucus membranes or skin necrosis:unsure Has patient had a PCN reaction that required  hospitalization:No Has patient had a PCN reaction occurring within the last 10 years:No If all of the above answers are "NO", then may proceed with Cephalosporin use.      Medication List    TAKE these medications   acetaminophen 500 MG tablet Commonly known as:  TYLENOL Take 500-1,000 mg by mouth every 6 (six) hours as needed (for pain.).   aspirin EC 325 MG tablet Take 1 tablet (325 mg total) by mouth daily. 1 tab a day for the next 30 days to prevent blood clots What changed:  Another medication with the same name was removed. Continue taking this medication, and follow the directions you see here.   clobetasol ointment 0.05 % Commonly known as:  TEMOVATE Apply 1 application topically daily as needed (for psorasis).   docusate sodium 100 MG capsule Commonly known as:  COLACE 1 tab 2 times a day while on narcotics.  STOOL SOFTENER   levothyroxine 300 MCG tablet Commonly known as:  SYNTHROID, LEVOTHROID Take 300 mcg by mouth daily before breakfast.   lisinopril 20 MG tablet Commonly known as:  PRINIVIL,ZESTRIL Take 20 mg by mouth daily.   loratadine 10 MG tablet Commonly known as:  CLARITIN Take 10 mg by mouth daily.   MOISTURIZING LOTION EX Apply 1 application topically 3 (three) times daily as needed (for dry skin therapy.). [place] Hand Lotion.   nebivolol 10 MG tablet Commonly known as:  BYSTOLIC Take 10 mg by mouth daily.   oxyCODONE 5 MG immediate release tablet Commonly known as:  ROXICODONE Take 1-2 tabs po q 4-6 hours prn pain   polyethylene glycol packet Commonly known as:  MIRALAX / GLYCOLAX 17grams in 6 oz of water twice a day until bowel movement.  LAXITIVE.  Restart if two days since last bowel movement   ranitidine 75 MG tablet Commonly known as:  ZANTAC Take 75-150 mg by mouth 3 (three) times daily. Take 1 tablet (75 mg) by mouth every morning & every afternoon; take 2 tablets (150 mg) by mouth at bedtime.   rosuvastatin 20 MG tablet Commonly  known as:  CRESTOR Take 20 mg by mouth at bedtime.   tamsulosin 0.4 MG Caps capsule Commonly known as:  FLOMAX Take 0.4 mg by mouth at bedtime.       Diagnostic Studies: No results found.  Disposition: 01-Home or Self Care  Discharge Instructions    CPM    Complete by:  As directed  Continuous passive motion machine (CPM):      Use the CPM from 0 to 90 for 6 hours per day.       You may break it up into 2 or 3 sessions per day.      Use CPM for 2 weeks or until you are told to stop.   Call MD / Call 911    Complete by:  As directed    If you experience chest pain or shortness of breath, CALL 911 and be transported to the hospital emergency room.  If you develope a fever above 101 F, pus (white drainage) or increased drainage or redness at the wound, or calf pain, call your surgeon's office.   Change dressing    Complete by:  As directed    Change the gauze dressing daily with sterile 4 x 4 inch gauze and apply TED hose.  DO NOT REMOVE BANDAGE OVER SURGICAL INCISION.  WASH WHOLE LEG INCLUDING OVER THE WATERPROOF BANDAGE WITH SOAP AND WATER EVERY DAY.   Constipation Prevention    Complete by:  As directed    Drink plenty of fluids.  Prune juice may be helpful.  You may use a stool softener, such as Colace (over the counter) 100 mg twice a day.  Use MiraLax (over the counter) for constipation as needed.   Diet - low sodium heart healthy    Complete by:  As directed    Discharge instructions    Complete by:  As directed    INSTRUCTIONS AFTER JOINT REPLACEMENT   Remove items at home which could result in a fall. This includes throw rugs or furniture in walking pathways ICE to the affected joint every three hours while awake for 30 minutes at a time, for at least the first 3-5 days, and then as needed for pain and swelling.  Continue to use ice for pain and swelling. You may notice swelling that will progress down to the foot and ankle.  This is normal after surgery.  Elevate your  leg when you are not up walking on it.   Continue to use the breathing machine you got in the hospital (incentive spirometer) which will help keep your temperature down.  It is common for your temperature to cycle up and down following surgery, especially at night when you are not up moving around and exerting yourself.  The breathing machine keeps your lungs expanded and your temperature down.   DIET:  As you were doing prior to hospitalization, we recommend a well-balanced diet.  DRESSING / WOUND CARE / SHOWERING  Keep the surgical dressing until follow up.  The dressing is water proof, so you can shower without any extra covering.  IF THE DRESSING FALLS OFF or the wound gets wet inside, change the dressing with sterile gauze.  Please use good hand washing techniques before changing the dressing.  Do not use any lotions or creams on the incision until instructed by your surgeon.    ACTIVITY  Increase activity slowly as tolerated, but follow the weight bearing instructions below.   No driving for 6 weeks or until further direction given by your physician.  You cannot drive while taking narcotics.  No lifting or carrying greater than 10 lbs. until further directed by your surgeon. Avoid periods of inactivity such as sitting longer than an hour when not asleep. This helps prevent blood clots.  You may return to work once you are authorized by your doctor.     WEIGHT BEARING  Weight bearing as tolerated with assist device (walker, cane, etc) as directed, use it as long as suggested by your surgeon or therapist, typically at least 2-3 weeks.   EXERCISES  Results after joint replacement surgery are often greatly improved when you follow the exercise, range of motion and muscle strengthening exercises prescribed by your doctor. Safety measures are also important to protect the joint from further injury. Any time any of these exercises cause you to have increased pain or swelling, decrease what  you are doing until you are comfortable again and then slowly increase them. If you have problems or questions, call your caregiver or physical therapist for advice.   Rehabilitation is important following a joint replacement. After just a few days of immobilization, the muscles of the leg can become weakened and shrink (atrophy).  These exercises are designed to build up the tone and strength of the thigh and leg muscles and to improve motion. Often times heat used for twenty to thirty minutes before working out will loosen up your tissues and help with improving the range of motion but do not use heat for the first two weeks following surgery (sometimes heat can increase post-operative swelling).   These exercises can be done on a training (exercise) mat, on the floor, on a table or on a bed. Use whatever works the best and is most comfortable for you.    Use music or television while you are exercising so that the exercises are a pleasant break in your day. This will make your life better with the exercises acting as a break in your routine that you can look forward to.   Perform all exercises about fifteen times, three times per day or as directed.  You should exercise both the operative leg and the other leg as well.   Exercises include:  Quad Sets - Tighten up the muscle on the front of the thigh (Quad) and hold for 5-10 seconds.   Straight Leg Raises - With your knee straight (if you were given a brace, keep it on), lift the leg to 60 degrees, hold for 3 seconds, and slowly lower the leg.  Perform this exercise against resistance later as your leg gets stronger.  Leg Slides: Lying on your back, slowly slide your foot toward your buttocks, bending your knee up off the floor (only go as far as is comfortable). Then slowly slide your foot back down until your leg is flat on the floor again.  Angel Wings: Lying on your back spread your legs to the side as far apart as you can without causing  discomfort.  Hamstring Strength:  Lying on your back, push your heel against the floor with your leg straight by tightening up the muscles of your buttocks.  Repeat, but this time bend your knee to a comfortable angle, and push your heel against the floor.  You may put a pillow under the heel to make it more comfortable if necessary.   A rehabilitation program following joint replacement surgery can speed recovery and prevent re-injury in the future due to weakened muscles. Contact your doctor or a physical therapist for more information on knee rehabilitation.    CONSTIPATION  Constipation is defined medically as fewer than three stools per week and severe constipation as less than one stool per week.  Even if you have a regular bowel pattern at home, your normal regimen is likely to be disrupted due to multiple reasons following surgery.  Combination of anesthesia, postoperative  narcotics, change in appetite and fluid intake all can affect your bowels.   YOU MUST use at least one of the following options; they are listed in order of increasing strength to get the job done.  They are all available over the counter, and you may need to use some, POSSIBLY even all of these options:    Drink plenty of fluids (prune juice may be helpful) and high fiber foods Colace 100 mg by mouth twice a day  Senokot for constipation as directed and as needed Dulcolax (bisacodyl), take with full glass of water  Miralax (polyethylene glycol) once or twice a day as needed.  If you have tried all these things and are unable to have a bowel movement in the first 3-4 days after surgery call either your surgeon or your primary doctor.    If you experience loose stools or diarrhea, hold the medications until you stool forms back up.  If your symptoms do not get better within 1 week or if they get worse, check with your doctor.  If you experience "the worst abdominal pain ever" or develop nausea or vomiting, please contact  the office immediately for further recommendations for treatment.   ITCHING:  If you experience itching with your medications, try taking only a single pain pill, or even half a pain pill at a time.  You can also use Benadryl over the counter for itching or also to help with sleep.   TED HOSE STOCKINGS:  Use stockings on both legs until for at least 2 weeks or as directed by physician office. They may be removed at night for sleeping.  MEDICATIONS:  See your medication summary on the "After Visit Summary" that nursing will review with you.  You may have some home medications which will be placed on hold until you complete the course of blood thinner medication.  It is important for you to complete the blood thinner medication as prescribed.  PRECAUTIONS:  If you experience chest pain or shortness of breath - call 911 immediately for transfer to the hospital emergency department.   If you develop a fever greater that 101 F, purulent drainage from wound, increased redness or drainage from wound, foul odor from the wound/dressing, or calf pain - CONTACT YOUR SURGEON.                                                   FOLLOW-UP APPOINTMENTS:  If you do not already have a post-op appointment, please call the office for an appointment to be seen by your surgeon.  Guidelines for how soon to be seen are listed in your "After Visit Summary", but are typically between 1-4 weeks after surgery.  OTHER INSTRUCTIONS:   Knee Replacement:  Do not place pillow under knee, focus on keeping the knee straight while resting. CPM instructions: 0-90 degrees, 2 hours in the morning, 2 hours in the afternoon, and 2 hours in the evening. Place foam block, curve side up under heel at all times except when in CPM or when walking.  DO NOT modify, tear, cut, or change the foam block in any way.  MAKE SURE YOU:  Understand these instructions.  Get help right away if you are not doing well or get worse.    Thank you for  letting us be a part of your medical care  team.  It is a privilege we respect greatly.  We hope these instructions will help you stay on track for a fast and full recovery!   Do not put a pillow under the knee. Place it under the heel.    Complete by:  As directed    Place gray foam block, curve side up under heel at all times except when in CPM or when walking.  DO NOT modify, tear, cut, or change in any way the gray foam block.   Increase activity slowly as tolerated    Complete by:  As directed    Patient may shower    Complete by:  As directed    Aquacel dressing is water proof    Wash over it and the whole leg with soap and water at the end of your shower   TED hose    Complete by:  As directed    Use stockings (TED hose) for 2 weeks on both leg(s).  You may remove them at night for sleeping.         SignedPascal Lux 04/08/2017, 8:28 AM

## 2017-04-08 NOTE — Care Management Note (Signed)
Case Management Note  Patient Details  Name: Bryan [name] MRN: [id number] Date of Birth: Apr 28, 1966  Subjective/Objective:  51 yr old gentleman s/p left total knee arthroplasty.                  Action/Plan: Case manager spoke with patient and his wife concerning discharge plan and DME needs. Choice for home health agency was offered. Patient states they used [facility] with his previous surgery, and wants to do so now. Patient has RW and 3in1. CPM will be delivered to his home this afternoon. CM called referral to Ayesha Rumpf, [facility]. Patient's wife will assit at discharge.    Expected Discharge Date:  04/08/17               Expected Discharge Plan:  Home w Home Health Services  In-House Referral:  NA  Discharge planning Services  CM Consult  Post Acute Care Choice:  Durable Medical Equipment, Home Health Choice offered to:  Patient, Spouse  DME Arranged:  CPM (has RW and 3in1) DME Agency:  [employer]  HH Arranged:  PT HH Agency:  [facility] (formerly [facility])  Status of Service:  Completed, signed off  If discussed at [employer], dates discussed:    Additional Comments:  Durenda Guthrie, RN 04/08/2017, 11:14 AM

## 2017-04-08 NOTE — Evaluation (Signed)
Occupational Therapy Evaluation Patient Details Name: Bryan [name] MRN: [id number] DOB: 1966/09/07 Today's Date: 04/08/2017    History of Present Illness 51 yo male s/p L TKA. Had R TKA 6 weeks ago. PMH. HTN and bells palsy   Clinical Impression   PTA, pt was independent and living with his wife. Pt currently performing ADLs and functional mobility at  Mod I level with increased time. Pt benefits from increased VCs due to impulsivity and poor RW management. Provided all education including LB ADLs strategies, toilet transfers, walk-in shower transfer, and knee precautions as well as answered pt questions. Recommend pt dc home with initial 24 hour supervision. All acute OT needs met and will sign off. Thank you.    Follow Up Recommendations  No OT follow up;Supervision/Assistance - 24 hour    Equipment Recommendations  None recommended by OT    Recommendations for Other Services       Precautions / Restrictions Precautions Precautions: Fall;Knee Precaution Booklet Issued: Yes (comment) Precaution Comments: handout provided Required Braces or Orthoses: Knee Immobilizer - Left Knee Immobilizer - Left: On when out of bed or walking Restrictions Weight Bearing Restrictions: Yes LLE Weight Bearing: Weight bearing as tolerated      Mobility Bed Mobility               General bed mobility comments: In recliner upon arrival  Transfers Overall transfer level: Needs assistance Equipment used: Rolling walker (2 wheeled) Transfers: Sit to/from Stand Sit to Stand: Supervision         General transfer comment: Pt benefits from supervision for transfers due to increased impulsivity    Balance Overall balance assessment: No apparent balance deficits (not formally assessed) (able to sit EOB without difficulty)                                         ADL either performed or assessed with clinical judgement   ADL Overall ADL's : Modified independent;At  baseline                                       General ADL Comments: Pt near functional baseline and performing ADLs and functional mobility at a Mod I level with increased time.  Pt benefits from increased VCs to "slow down" due to increased impulsivity     Vision         Perception     Praxis      Pertinent Vitals/Pain Pain Assessment: 0-10 Pain Score: 2  Pain Location: L knee Pain Descriptors / Indicators: Numbness Pain Intervention(s): Monitored during session     Hand Dominance Right   Extremity/Trunk Assessment Upper Extremity Assessment Upper Extremity Assessment: Overall WFL for tasks assessed   Lower Extremity Assessment Lower Extremity Assessment: Defer to PT evaluation;LLE deficits/detail LLE Deficits / Details: Pt demonstrates good control and strength of LLE.    Cervical / Trunk Assessment Cervical / Trunk Assessment: Normal   Communication Communication Communication: No difficulties   Cognition Arousal/Alertness: Awake/alert Behavior During Therapy: WFL for tasks assessed/performed Overall Cognitive Status: Within Functional Limits for tasks assessed                                     General Comments  Provided pt with further education on RW management during functional mobility and ADLs. Pt verbalized udnerstanding but required increased cues to use RW correctly.  Pt wife arrived at end of session and confirmed she will be available at [employer]    Exercises     [place] expects to be discharged to:: Private residence Living Arrangements: Spouse/significant other Available Help at Discharge: Family;Available 24 hours/day Type of Home: House Home Access: Stairs to enter [employer] of Steps: 4 Entrance Stairs-Rails: None Home Layout: One level     Bathroom Shower/Tub: Occupational psychologist: Handicapped height     Home Equipment: Environmental consultant - 2  wheels;Bedside commode          Prior Functioning/Environment Level of Independence: Independent        Comments: was undergoing outpt PT for R TKA, driving        OT Problem List: Decreased strength;Decreased activity tolerance;Impaired balance (sitting and/or standing);Decreased safety awareness;Decreased knowledge of use of DME or AE;Decreased knowledge of precautions;Pain      OT Treatment/Interventions:      OT Goals(Current goals can be found in the care plan section) Acute Rehab OT Goals Patient Stated Goal: Go home OT Goal Formulation: With patient Time For Goal Achievement: 04/22/17 Potential to Achieve Goals: Good  OT Frequency:     Barriers to D/C:            Co-evaluation              End of Session Equipment Utilized During Treatment: Rolling walker Nurse Communication: Mobility status;Weight bearing status  Activity Tolerance: Patient tolerated treatment well Patient left: in chair;with call bell/phone within reach;with family/visitor present  OT Visit Diagnosis: Unsteadiness on feet (R26.81);Muscle weakness (generalized) (M62.81);Pain Pain - Right/Left: Left Pain - part of body: Knee                Time: 0755-0809 OT Time Calculation (min): 14 min Charges:  OT General Charges $OT Visit: 1 Procedure OT Evaluation $OT Eval Low Complexity: 1 Procedure G-Codes:     Marqueta Pulley, OTR/L [phone]  [place] 04/08/2017, 8:19 AM

## 2017-04-08 NOTE — Progress Notes (Signed)
Patient and wife given discharge instructions and all questions answered. Patient discharged via wheelchair with all belongings.  

## 2017-04-08 NOTE — Progress Notes (Signed)
Physical Therapy Treatment Patient Details Name: Bryan [name] MRN: [id number] DOB: [birth year] Today's Date: 04/08/2017    History of Present Illness 51 yo male s/p L TKA. Had R TKA 6 weeks ago. PMH. HTN and bells palsy    PT Comments    Pt progressing well and is functioning at a supervision level. Pt with excellent ROM to L knee for POD #1. Pt however very anxious and impulsive required max v/c's to decrease pace and concentrate on body mechanics and technique to optimize L knee rehab. Pt safe to d/c home with spouse once medically cleared.    Follow Up Recommendations  Home health PT;Supervision/Assistance - 24 hour     Equipment Recommendations  None recommended by PT    Recommendations for Other Services       Precautions / Restrictions Precautions Precautions: Fall;Knee Precaution Booklet Issued: Yes (comment) Precaution Comments: pt impulsive and anxious however steady on feet Required Braces or Orthoses: Knee Immobilizer - Left Knee Immobilizer - Left: On when out of bed or walking Restrictions Weight Bearing Restrictions: Yes LLE Weight Bearing: Weight bearing as tolerated    Mobility  Bed Mobility Overal bed mobility: Needs Assistance Bed Mobility: Supine to Sit     Supine to sit: Supervision     General bed mobility comments: no difficulty, used long sit technique  Transfers Overall transfer level: Needs assistance Equipment used: Rolling walker (2 wheeled) Transfers: Sit to/from Stand Sit to Stand: Supervision         General transfer comment: v/c's for safe hand placement. v/c's to keep L foot back when sitting to promote knee flexion  Ambulation/Gait Ambulation/Gait assistance: Supervision Ambulation Distance (Feet): 500 Feet Assistive device: Rolling walker (2 wheeled) Gait Pattern/deviations: Step-through pattern Gait velocity: impulsively fast Gait velocity interpretation: at or above normal speed for age/gender General Gait  Details: v/c's to increased L LE step length to promote L knee extension. max v/c's to minimize UE WBing and increased bilat LE WBing. v/c's to slow down and concentrate on technique rather than speed to get it done. Pt with no episodes of LOB   Stairs Stairs: Yes   Stair Management: Two rails;Step to pattern Number of Stairs: 2 General stair comments: pt with good recall of "up with the good, down with the bad". no difficulty  Wheelchair Mobility    Modified Rankin (Stroke Patients Only)       Balance Overall balance assessment: No apparent balance deficits (not formally assessed)                                          Cognition Arousal/Alertness: Awake/alert Behavior During Therapy: Anxious;Impulsive Overall Cognitive Status: Impaired/Different from baseline Area of Impairment: Safety/judgement                         Safety/Judgement: Decreased awareness of safety     General Comments: max v/c's to slow down for quality movement's insteady of quick, unsafe movements      Exercises Total Joint Exercises Ankle Circles/Pumps: AROM;Both;10 reps;Supine Quad Sets: AROM;Left;10 reps;Supine Heel Slides: AAROM;Left;10 reps;Supine Goniometric ROM: AAROM knee flexion to 58 degrees in supine    General Comments General comments (skin integrity, edema, etc.): Provided pt with further education on RW management during functional mobility and ADLs. Pt verbalized udnerstanding but required increased cues to use RW correctly.  Pt wife arrived  at end of session and confirmed she will be available at dc      Pertinent Vitals/Pain Pain Assessment: 0-10 Pain Score: 5  Pain Location: L knee Pain Descriptors / Indicators: Sore Pain Intervention(s): Monitored during session    Home Living Family/patient expects to be discharged to:: Private residence Living Arrangements: Spouse/significant other Available Help at Discharge: Family;Available 24  hours/day Type of Home: House Home Access: Stairs to enter Entrance Stairs-Rails: None Home Layout: One level Home Equipment: Environmental consultant - 2 wheels;Bedside commode      Prior Function Level of Independence: Independent      Comments: was undergoing outpt PT for R TKA, driving   PT Goals (current goals can now be found in the care plan section) Acute Rehab PT Goals Patient Stated Goal: go home today Progress towards PT goals: Progressing toward goals    Frequency    7X/week      PT Plan Current plan remains appropriate    Co-evaluation             End of Session Equipment Utilized During Treatment: Gait belt Activity Tolerance: Patient tolerated treatment well Patient left: in chair;with call bell/phone within reach;with nursing/sitter in room Nurse Communication: Mobility status PT Visit Diagnosis: Muscle weakness (generalized) (M62.81);Difficulty in walking, not elsewhere classified (R26.2)     Time: 1610-9604 PT Time Calculation (min) (ACUTE ONLY): 21 min  Charges:  $Gait Training: 8-22 mins                    G Codes:       Bryan [name], PT, DPT Pager #: [phone] Office #: [phone]    Bryan [name] 04/08/2017, 8:36 AM

## 2017-04-09 DIAGNOSIS — I10 Essential (primary) hypertension: Secondary | ICD-10-CM | POA: Diagnosis not present

## 2017-04-09 DIAGNOSIS — Z471 Aftercare following joint replacement surgery: Secondary | ICD-10-CM | POA: Diagnosis not present

## 2017-04-09 DIAGNOSIS — Z96651 Presence of right artificial knee joint: Secondary | ICD-10-CM | POA: Diagnosis not present

## 2017-04-09 DIAGNOSIS — Z7982 Long term (current) use of aspirin: Secondary | ICD-10-CM | POA: Diagnosis not present

## 2017-04-09 DIAGNOSIS — G4733 Obstructive sleep apnea (adult) (pediatric): Secondary | ICD-10-CM | POA: Diagnosis not present

## 2017-04-09 DIAGNOSIS — E039 Hypothyroidism, unspecified: Secondary | ICD-10-CM | POA: Diagnosis not present

## 2017-04-09 DIAGNOSIS — K219 Gastro-esophageal reflux disease without esophagitis: Secondary | ICD-10-CM | POA: Diagnosis not present

## 2017-04-09 DIAGNOSIS — E785 Hyperlipidemia, unspecified: Secondary | ICD-10-CM | POA: Diagnosis not present

## 2017-04-11 DIAGNOSIS — G4733 Obstructive sleep apnea (adult) (pediatric): Secondary | ICD-10-CM | POA: Diagnosis not present

## 2017-04-11 DIAGNOSIS — K219 Gastro-esophageal reflux disease without esophagitis: Secondary | ICD-10-CM | POA: Diagnosis not present

## 2017-04-11 DIAGNOSIS — E039 Hypothyroidism, unspecified: Secondary | ICD-10-CM | POA: Diagnosis not present

## 2017-04-11 DIAGNOSIS — Z7982 Long term (current) use of aspirin: Secondary | ICD-10-CM | POA: Diagnosis not present

## 2017-04-11 DIAGNOSIS — Z96651 Presence of right artificial knee joint: Secondary | ICD-10-CM | POA: Diagnosis not present

## 2017-04-11 DIAGNOSIS — I10 Essential (primary) hypertension: Secondary | ICD-10-CM | POA: Diagnosis not present

## 2017-04-11 DIAGNOSIS — Z471 Aftercare following joint replacement surgery: Secondary | ICD-10-CM | POA: Diagnosis not present

## 2017-04-11 DIAGNOSIS — E785 Hyperlipidemia, unspecified: Secondary | ICD-10-CM | POA: Diagnosis not present

## 2017-04-14 DIAGNOSIS — K219 Gastro-esophageal reflux disease without esophagitis: Secondary | ICD-10-CM | POA: Diagnosis not present

## 2017-04-14 DIAGNOSIS — E785 Hyperlipidemia, unspecified: Secondary | ICD-10-CM | POA: Diagnosis not present

## 2017-04-14 DIAGNOSIS — E039 Hypothyroidism, unspecified: Secondary | ICD-10-CM | POA: Diagnosis not present

## 2017-04-14 DIAGNOSIS — I10 Essential (primary) hypertension: Secondary | ICD-10-CM | POA: Diagnosis not present

## 2017-04-14 DIAGNOSIS — Z471 Aftercare following joint replacement surgery: Secondary | ICD-10-CM | POA: Diagnosis not present

## 2017-04-14 DIAGNOSIS — Z96651 Presence of right artificial knee joint: Secondary | ICD-10-CM | POA: Diagnosis not present

## 2017-04-14 DIAGNOSIS — Z7982 Long term (current) use of aspirin: Secondary | ICD-10-CM | POA: Diagnosis not present

## 2017-04-14 DIAGNOSIS — G4733 Obstructive sleep apnea (adult) (pediatric): Secondary | ICD-10-CM | POA: Diagnosis not present

## 2017-04-16 DIAGNOSIS — K219 Gastro-esophageal reflux disease without esophagitis: Secondary | ICD-10-CM | POA: Diagnosis not present

## 2017-04-16 DIAGNOSIS — Z471 Aftercare following joint replacement surgery: Secondary | ICD-10-CM | POA: Diagnosis not present

## 2017-04-16 DIAGNOSIS — G4733 Obstructive sleep apnea (adult) (pediatric): Secondary | ICD-10-CM | POA: Diagnosis not present

## 2017-04-16 DIAGNOSIS — E039 Hypothyroidism, unspecified: Secondary | ICD-10-CM | POA: Diagnosis not present

## 2017-04-16 DIAGNOSIS — I10 Essential (primary) hypertension: Secondary | ICD-10-CM | POA: Diagnosis not present

## 2017-04-16 DIAGNOSIS — Z96651 Presence of right artificial knee joint: Secondary | ICD-10-CM | POA: Diagnosis not present

## 2017-04-16 DIAGNOSIS — Z7982 Long term (current) use of aspirin: Secondary | ICD-10-CM | POA: Diagnosis not present

## 2017-04-16 DIAGNOSIS — E785 Hyperlipidemia, unspecified: Secondary | ICD-10-CM | POA: Diagnosis not present

## 2017-04-18 DIAGNOSIS — Z471 Aftercare following joint replacement surgery: Secondary | ICD-10-CM | POA: Diagnosis not present

## 2017-04-18 DIAGNOSIS — E785 Hyperlipidemia, unspecified: Secondary | ICD-10-CM | POA: Diagnosis not present

## 2017-04-18 DIAGNOSIS — Z96651 Presence of right artificial knee joint: Secondary | ICD-10-CM | POA: Diagnosis not present

## 2017-04-18 DIAGNOSIS — Z7982 Long term (current) use of aspirin: Secondary | ICD-10-CM | POA: Diagnosis not present

## 2017-04-18 DIAGNOSIS — E039 Hypothyroidism, unspecified: Secondary | ICD-10-CM | POA: Diagnosis not present

## 2017-04-18 DIAGNOSIS — K219 Gastro-esophageal reflux disease without esophagitis: Secondary | ICD-10-CM | POA: Diagnosis not present

## 2017-04-18 DIAGNOSIS — I10 Essential (primary) hypertension: Secondary | ICD-10-CM | POA: Diagnosis not present

## 2017-04-18 DIAGNOSIS — G4733 Obstructive sleep apnea (adult) (pediatric): Secondary | ICD-10-CM | POA: Diagnosis not present

## 2017-04-21 DIAGNOSIS — Z471 Aftercare following joint replacement surgery: Secondary | ICD-10-CM | POA: Diagnosis not present

## 2017-04-21 DIAGNOSIS — E785 Hyperlipidemia, unspecified: Secondary | ICD-10-CM | POA: Diagnosis not present

## 2017-04-21 DIAGNOSIS — E039 Hypothyroidism, unspecified: Secondary | ICD-10-CM | POA: Diagnosis not present

## 2017-04-21 DIAGNOSIS — Z7982 Long term (current) use of aspirin: Secondary | ICD-10-CM | POA: Diagnosis not present

## 2017-04-21 DIAGNOSIS — I10 Essential (primary) hypertension: Secondary | ICD-10-CM | POA: Diagnosis not present

## 2017-04-21 DIAGNOSIS — Z96651 Presence of right artificial knee joint: Secondary | ICD-10-CM | POA: Diagnosis not present

## 2017-04-21 DIAGNOSIS — K219 Gastro-esophageal reflux disease without esophagitis: Secondary | ICD-10-CM | POA: Diagnosis not present

## 2017-04-21 DIAGNOSIS — G4733 Obstructive sleep apnea (adult) (pediatric): Secondary | ICD-10-CM | POA: Diagnosis not present

## 2017-04-21 DIAGNOSIS — M1712 Unilateral primary osteoarthritis, left knee: Secondary | ICD-10-CM | POA: Diagnosis not present

## 2017-04-22 DIAGNOSIS — R262 Difficulty in walking, not elsewhere classified: Secondary | ICD-10-CM | POA: Diagnosis not present

## 2017-04-22 DIAGNOSIS — M1712 Unilateral primary osteoarthritis, left knee: Secondary | ICD-10-CM | POA: Diagnosis not present

## 2017-04-22 DIAGNOSIS — M25562 Pain in left knee: Secondary | ICD-10-CM | POA: Diagnosis not present

## 2017-04-22 DIAGNOSIS — Z96652 Presence of left artificial knee joint: Secondary | ICD-10-CM | POA: Diagnosis not present

## 2017-04-22 NOTE — Anesthesia Procedure Notes (Signed)
Anesthesia Regional Block: Adductor canal block   Pre-Anesthetic Checklist: ,, timeout performed, Correct Patient, Correct Site, Correct Laterality, Correct Procedure, Correct Position, site marked, Risks and benefits discussed, Surgical consent,  Pre-op evaluation,  At surgeon's request  Laterality: Left  Prep: chloraprep       Needles:   Needle Type: Stimulator Needle - 80          Additional Needles:   Procedures: Doppler guided, Ultrasound guided,,,,,,  Narrative:  Start time: 04/07/2017 9:00 AM End time: 04/07/2017 9:15 AM Injection made incrementally with aspirations every 5 mL.  Performed by: Personally  Anesthesiologist: Dorris Singh

## 2017-04-22 NOTE — Anesthesia Procedure Notes (Signed)
Anesthesia Regional Block: Narrative:       

## 2017-04-22 NOTE — Addendum Note (Signed)
Addendum  created 04/22/17 2325 by Dorris Singh, MD   Anesthesia Intra Blocks edited, Child order released for a procedure order, Sign clinical note

## 2017-04-24 DIAGNOSIS — M1712 Unilateral primary osteoarthritis, left knee: Secondary | ICD-10-CM | POA: Diagnosis not present

## 2017-04-24 DIAGNOSIS — Z96652 Presence of left artificial knee joint: Secondary | ICD-10-CM | POA: Diagnosis not present

## 2017-04-24 DIAGNOSIS — M25562 Pain in left knee: Secondary | ICD-10-CM | POA: Diagnosis not present

## 2017-04-24 DIAGNOSIS — M25662 Stiffness of left knee, not elsewhere classified: Secondary | ICD-10-CM | POA: Diagnosis not present

## 2017-04-28 DIAGNOSIS — M25662 Stiffness of left knee, not elsewhere classified: Secondary | ICD-10-CM | POA: Diagnosis not present

## 2017-04-28 DIAGNOSIS — M25562 Pain in left knee: Secondary | ICD-10-CM | POA: Diagnosis not present

## 2017-04-28 DIAGNOSIS — M1712 Unilateral primary osteoarthritis, left knee: Secondary | ICD-10-CM | POA: Diagnosis not present

## 2017-04-28 DIAGNOSIS — Z96652 Presence of left artificial knee joint: Secondary | ICD-10-CM | POA: Diagnosis not present

## 2017-04-30 DIAGNOSIS — Z96652 Presence of left artificial knee joint: Secondary | ICD-10-CM | POA: Diagnosis not present

## 2017-04-30 DIAGNOSIS — M25562 Pain in left knee: Secondary | ICD-10-CM | POA: Diagnosis not present

## 2017-04-30 DIAGNOSIS — M25662 Stiffness of left knee, not elsewhere classified: Secondary | ICD-10-CM | POA: Diagnosis not present

## 2017-04-30 DIAGNOSIS — M1712 Unilateral primary osteoarthritis, left knee: Secondary | ICD-10-CM | POA: Diagnosis not present

## 2017-05-05 DIAGNOSIS — M25662 Stiffness of left knee, not elsewhere classified: Secondary | ICD-10-CM | POA: Diagnosis not present

## 2017-05-05 DIAGNOSIS — M1712 Unilateral primary osteoarthritis, left knee: Secondary | ICD-10-CM | POA: Diagnosis not present

## 2017-05-05 DIAGNOSIS — M25562 Pain in left knee: Secondary | ICD-10-CM | POA: Diagnosis not present

## 2017-05-05 DIAGNOSIS — Z96652 Presence of left artificial knee joint: Secondary | ICD-10-CM | POA: Diagnosis not present

## 2017-05-07 DIAGNOSIS — M25562 Pain in left knee: Secondary | ICD-10-CM | POA: Diagnosis not present

## 2017-05-07 DIAGNOSIS — M1712 Unilateral primary osteoarthritis, left knee: Secondary | ICD-10-CM | POA: Diagnosis not present

## 2017-05-07 DIAGNOSIS — M25662 Stiffness of left knee, not elsewhere classified: Secondary | ICD-10-CM | POA: Diagnosis not present

## 2017-05-07 DIAGNOSIS — Z96652 Presence of left artificial knee joint: Secondary | ICD-10-CM | POA: Diagnosis not present

## 2017-05-09 ENCOUNTER — Encounter (HOSPITAL_COMMUNITY): Payer: Self-pay | Admitting: Orthopedic Surgery

## 2017-05-09 NOTE — Addendum Note (Signed)
Addendum  created 05/09/17 0721 by Sonda Primes, CRNA   Anesthesia Event edited

## 2017-05-13 DIAGNOSIS — M1712 Unilateral primary osteoarthritis, left knee: Secondary | ICD-10-CM | POA: Diagnosis not present

## 2017-05-13 DIAGNOSIS — M25562 Pain in left knee: Secondary | ICD-10-CM | POA: Diagnosis not present

## 2017-05-13 DIAGNOSIS — R262 Difficulty in walking, not elsewhere classified: Secondary | ICD-10-CM | POA: Diagnosis not present

## 2017-05-13 DIAGNOSIS — M25662 Stiffness of left knee, not elsewhere classified: Secondary | ICD-10-CM | POA: Diagnosis not present

## 2017-05-15 DIAGNOSIS — M25562 Pain in left knee: Secondary | ICD-10-CM | POA: Diagnosis not present

## 2017-05-15 DIAGNOSIS — M1712 Unilateral primary osteoarthritis, left knee: Secondary | ICD-10-CM | POA: Diagnosis not present

## 2017-05-15 DIAGNOSIS — Z96652 Presence of left artificial knee joint: Secondary | ICD-10-CM | POA: Diagnosis not present

## 2017-05-15 DIAGNOSIS — M25662 Stiffness of left knee, not elsewhere classified: Secondary | ICD-10-CM | POA: Diagnosis not present

## 2017-05-19 DIAGNOSIS — M1712 Unilateral primary osteoarthritis, left knee: Secondary | ICD-10-CM | POA: Diagnosis not present

## 2017-06-24 DIAGNOSIS — L409 Psoriasis, unspecified: Secondary | ICD-10-CM | POA: Diagnosis not present

## 2017-06-24 DIAGNOSIS — L80 Vitiligo: Secondary | ICD-10-CM | POA: Diagnosis not present

## 2017-06-26 DIAGNOSIS — G4733 Obstructive sleep apnea (adult) (pediatric): Secondary | ICD-10-CM | POA: Diagnosis not present

## 2017-06-30 DIAGNOSIS — G4733 Obstructive sleep apnea (adult) (pediatric): Secondary | ICD-10-CM | POA: Diagnosis not present

## 2017-07-01 DIAGNOSIS — M1712 Unilateral primary osteoarthritis, left knee: Secondary | ICD-10-CM | POA: Diagnosis not present

## 2017-07-31 DIAGNOSIS — G4733 Obstructive sleep apnea (adult) (pediatric): Secondary | ICD-10-CM | POA: Diagnosis not present

## 2017-08-11 DIAGNOSIS — E782 Mixed hyperlipidemia: Secondary | ICD-10-CM | POA: Diagnosis not present

## 2017-08-11 DIAGNOSIS — Z125 Encounter for screening for malignant neoplasm of prostate: Secondary | ICD-10-CM | POA: Diagnosis not present

## 2017-08-11 DIAGNOSIS — E039 Hypothyroidism, unspecified: Secondary | ICD-10-CM | POA: Diagnosis not present

## 2017-08-11 DIAGNOSIS — I10 Essential (primary) hypertension: Secondary | ICD-10-CM | POA: Diagnosis not present

## 2017-08-11 DIAGNOSIS — Z0000 Encounter for general adult medical examination without abnormal findings: Secondary | ICD-10-CM | POA: Diagnosis not present

## 2017-08-11 DIAGNOSIS — K219 Gastro-esophageal reflux disease without esophagitis: Secondary | ICD-10-CM | POA: Diagnosis not present

## 2017-08-31 DIAGNOSIS — G4733 Obstructive sleep apnea (adult) (pediatric): Secondary | ICD-10-CM | POA: Diagnosis not present

## 2017-09-18 DIAGNOSIS — L80 Vitiligo: Secondary | ICD-10-CM | POA: Diagnosis not present

## 2017-09-18 DIAGNOSIS — L409 Psoriasis, unspecified: Secondary | ICD-10-CM | POA: Diagnosis not present

## 2017-09-23 DIAGNOSIS — G4733 Obstructive sleep apnea (adult) (pediatric): Secondary | ICD-10-CM | POA: Diagnosis not present

## 2017-09-24 DIAGNOSIS — G4733 Obstructive sleep apnea (adult) (pediatric): Secondary | ICD-10-CM | POA: Diagnosis not present

## 2017-09-30 DIAGNOSIS — G4733 Obstructive sleep apnea (adult) (pediatric): Secondary | ICD-10-CM | POA: Diagnosis not present

## 2017-10-16 DIAGNOSIS — Z1211 Encounter for screening for malignant neoplasm of colon: Secondary | ICD-10-CM | POA: Diagnosis not present

## 2017-10-16 DIAGNOSIS — Z01818 Encounter for other preprocedural examination: Secondary | ICD-10-CM | POA: Diagnosis not present

## 2017-10-31 DIAGNOSIS — G4733 Obstructive sleep apnea (adult) (pediatric): Secondary | ICD-10-CM | POA: Diagnosis not present

## 2017-11-10 IMAGING — CR DG CHEST 2V
2 series · 2 of 2 positions shown · non-contrast
Comparison: 01/30/17

CLINICAL DATA: Preoperative evaluation for upcoming knee surgery

EXAM:
CHEST  2 VIEW

[w chest pa]
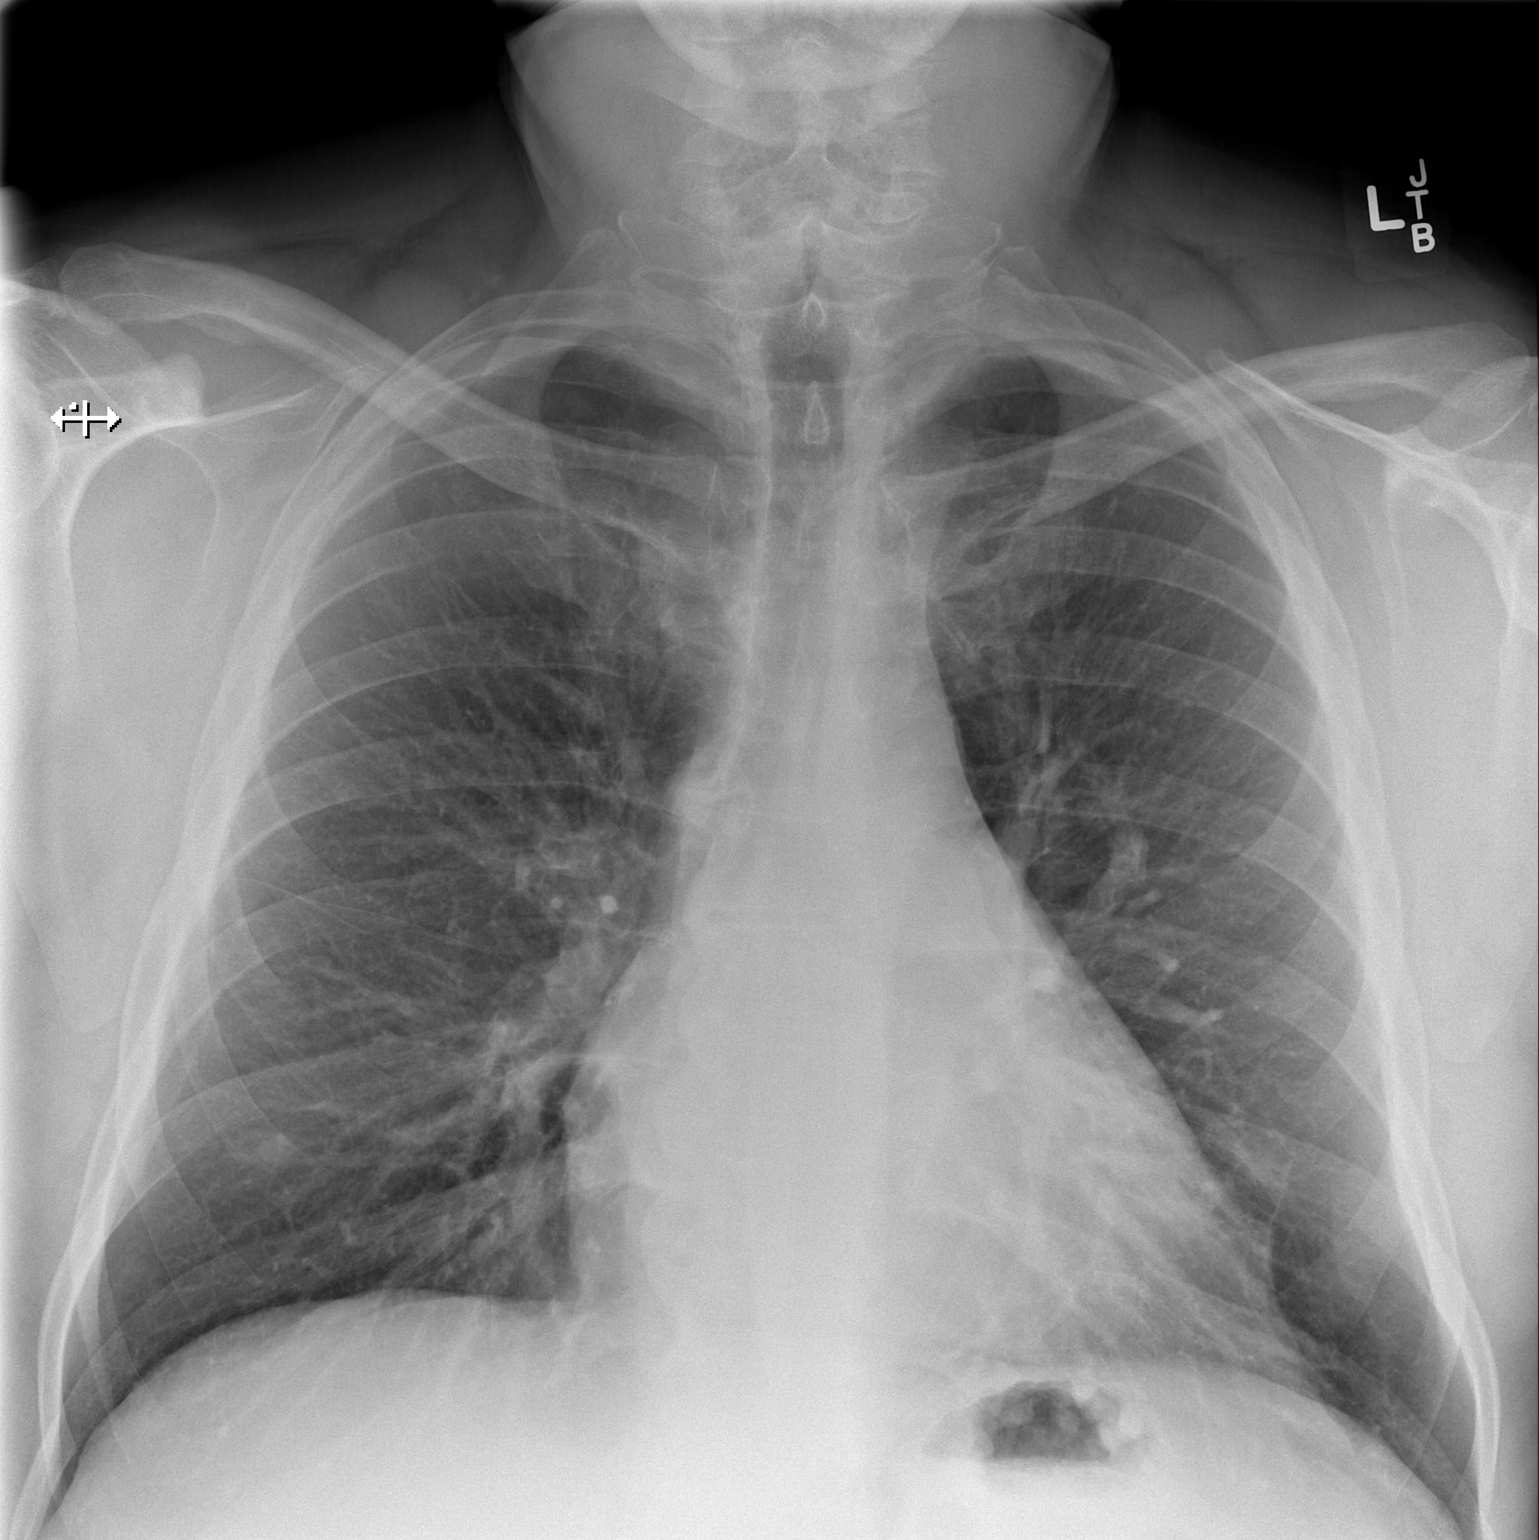

[w chest lat]
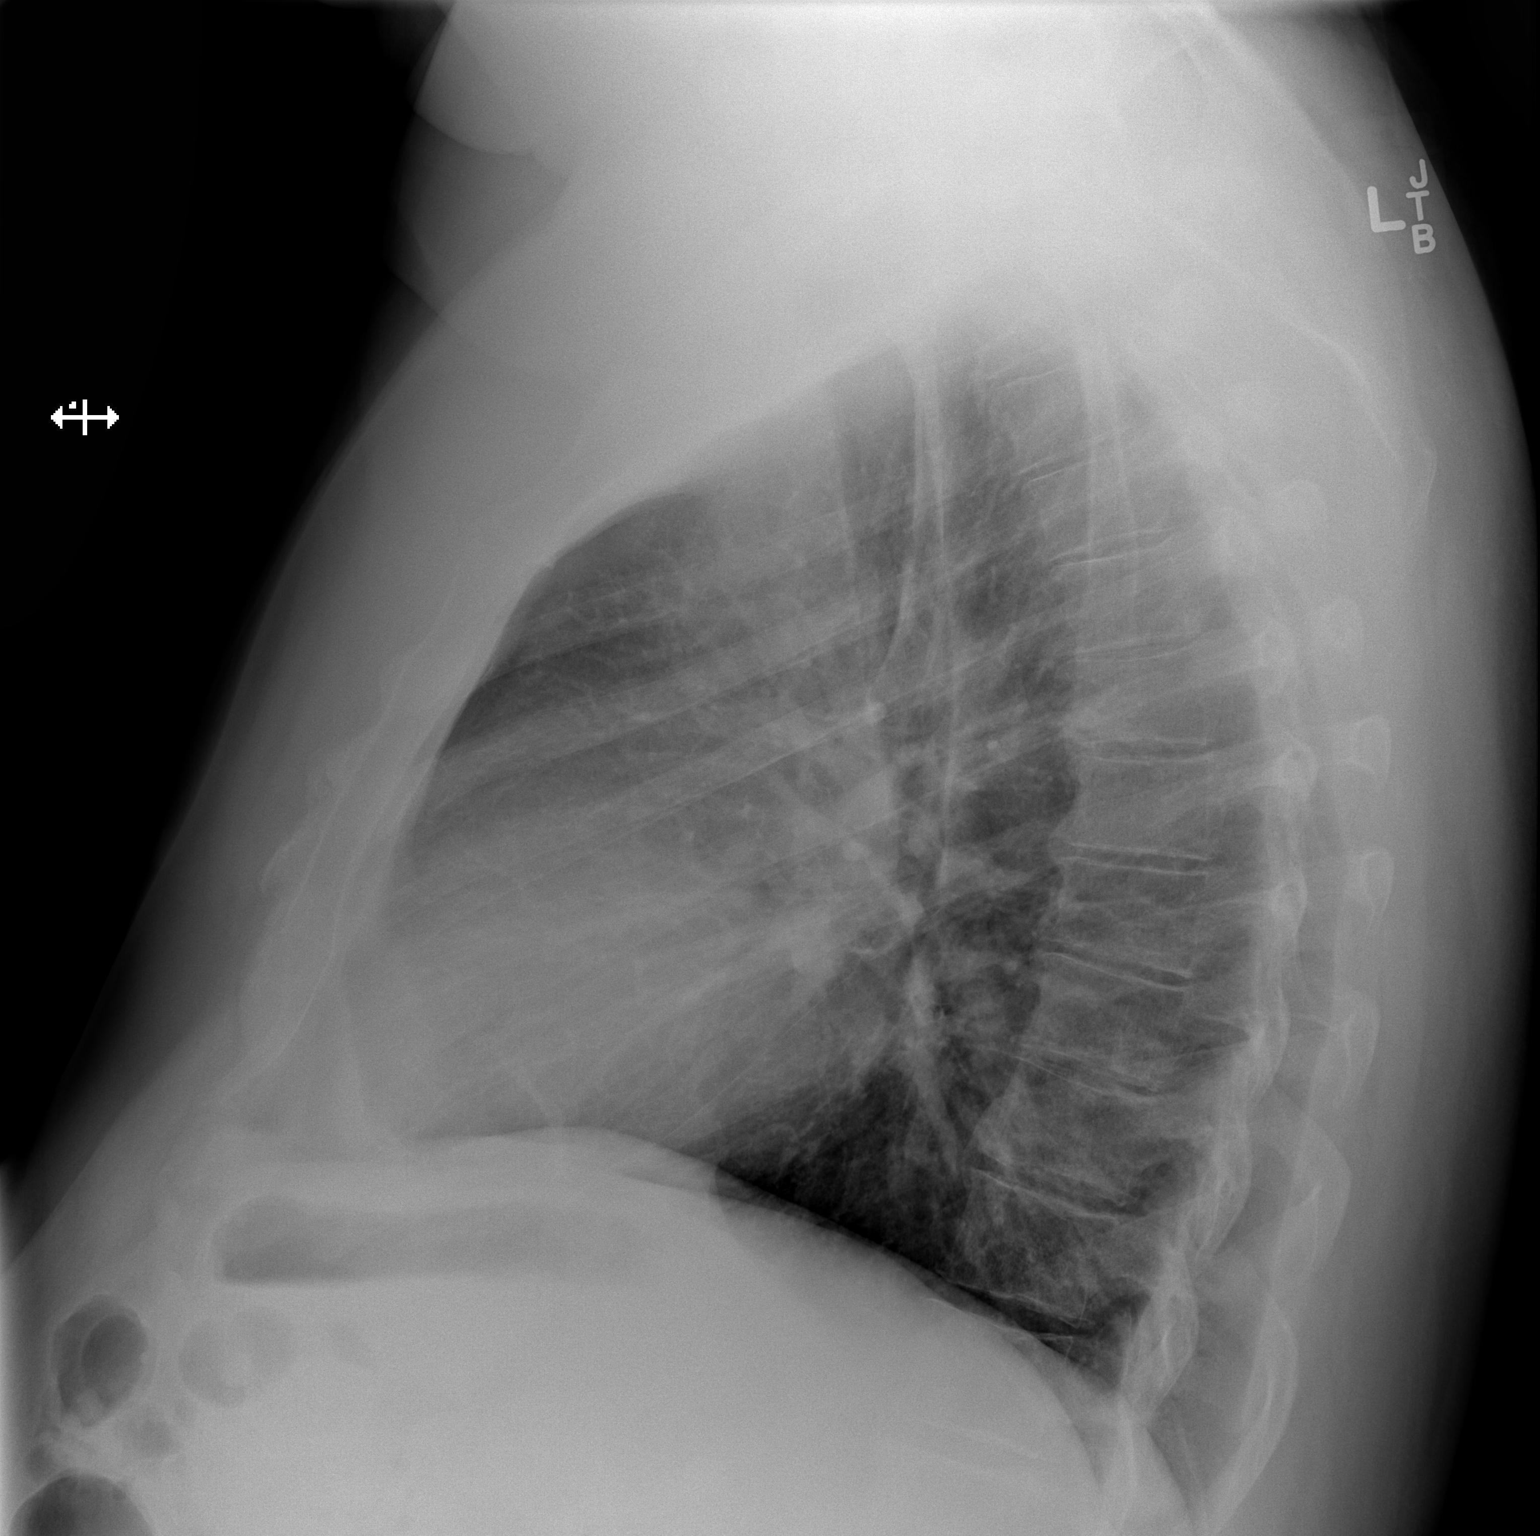

[2 of 2 positions shown; findings below may reference images not displayed]

FINDINGS: Cardiac shadow is within normal limits. The lungs are well aerated
bilaterally. Nipple shadows are seen bilaterally. No focal
infiltrate or sizable effusion is seen. No bony abnormality is
noted.
IMPRESSION: No active cardiopulmonary disease.

## 2017-11-18 DIAGNOSIS — K5730 Diverticulosis of large intestine without perforation or abscess without bleeding: Secondary | ICD-10-CM | POA: Diagnosis not present

## 2017-11-18 DIAGNOSIS — Z1211 Encounter for screening for malignant neoplasm of colon: Secondary | ICD-10-CM | POA: Diagnosis not present

## 2017-11-30 DIAGNOSIS — G4733 Obstructive sleep apnea (adult) (pediatric): Secondary | ICD-10-CM | POA: Diagnosis not present

## 2017-12-31 DIAGNOSIS — G4733 Obstructive sleep apnea (adult) (pediatric): Secondary | ICD-10-CM | POA: Diagnosis not present

## 2018-01-22 DIAGNOSIS — G4733 Obstructive sleep apnea (adult) (pediatric): Secondary | ICD-10-CM | POA: Diagnosis not present

## 2018-01-31 DIAGNOSIS — G4733 Obstructive sleep apnea (adult) (pediatric): Secondary | ICD-10-CM | POA: Diagnosis not present

## 2018-02-13 DIAGNOSIS — I10 Essential (primary) hypertension: Secondary | ICD-10-CM | POA: Diagnosis not present

## 2018-02-13 DIAGNOSIS — K219 Gastro-esophageal reflux disease without esophagitis: Secondary | ICD-10-CM | POA: Diagnosis not present

## 2018-02-13 DIAGNOSIS — E039 Hypothyroidism, unspecified: Secondary | ICD-10-CM | POA: Diagnosis not present

## 2018-02-13 DIAGNOSIS — E782 Mixed hyperlipidemia: Secondary | ICD-10-CM | POA: Diagnosis not present

## 2018-07-25 DIAGNOSIS — M170 Bilateral primary osteoarthritis of knee: Secondary | ICD-10-CM | POA: Diagnosis not present

## 2018-08-19 DIAGNOSIS — Z23 Encounter for immunization: Secondary | ICD-10-CM | POA: Diagnosis not present

## 2018-08-19 DIAGNOSIS — E782 Mixed hyperlipidemia: Secondary | ICD-10-CM | POA: Diagnosis not present

## 2018-08-19 DIAGNOSIS — Z0000 Encounter for general adult medical examination without abnormal findings: Secondary | ICD-10-CM | POA: Diagnosis not present

## 2018-08-19 DIAGNOSIS — K219 Gastro-esophageal reflux disease without esophagitis: Secondary | ICD-10-CM | POA: Diagnosis not present

## 2018-08-19 DIAGNOSIS — I10 Essential (primary) hypertension: Secondary | ICD-10-CM | POA: Diagnosis not present

## 2018-08-29 ENCOUNTER — Other Ambulatory Visit: Payer: Self-pay | Admitting: Physician Assistant

## 2018-08-29 ENCOUNTER — Inpatient Hospital Stay (HOSPITAL_COMMUNITY)
Admission: AD | Admit: 2018-08-29 | Discharge: 2018-09-02 | DRG: 485 | Disposition: A | Discharge status: Home Health | Payer: BLUE CROSS/BLUE SHIELD | Source: Ambulatory Visit | Attending: Orthopedic Surgery | Admitting: Orthopedic Surgery

## 2018-08-29 ENCOUNTER — Encounter (HOSPITAL_COMMUNITY): Payer: Self-pay

## 2018-08-29 ENCOUNTER — Ambulatory Visit (HOSPITAL_COMMUNITY)
Admission: RE | Admit: 2018-08-29 | Discharge: 2018-08-29 | Disposition: A | Discharge status: Home / Self Care | Payer: BLUE CROSS/BLUE SHIELD | Source: Ambulatory Visit | Attending: Physician Assistant | Admitting: Physician Assistant

## 2018-08-29 ENCOUNTER — Other Ambulatory Visit: Payer: Self-pay

## 2018-08-29 DIAGNOSIS — G4733 Obstructive sleep apnea (adult) (pediatric): Secondary | ICD-10-CM | POA: Diagnosis present

## 2018-08-29 DIAGNOSIS — T8453XA Infection and inflammatory reaction due to internal right knee prosthesis, initial encounter: Secondary | ICD-10-CM | POA: Diagnosis not present

## 2018-08-29 DIAGNOSIS — I10 Essential (primary) hypertension: Secondary | ICD-10-CM | POA: Diagnosis not present

## 2018-08-29 DIAGNOSIS — A4101 Sepsis due to Methicillin susceptible Staphylococcus aureus: Secondary | ICD-10-CM | POA: Diagnosis not present

## 2018-08-29 DIAGNOSIS — E669 Obesity, unspecified: Secondary | ICD-10-CM | POA: Diagnosis present

## 2018-08-29 DIAGNOSIS — Z79890 Hormone replacement therapy (postmenopausal): Secondary | ICD-10-CM | POA: Diagnosis not present

## 2018-08-29 DIAGNOSIS — Y831 Surgical operation with implant of artificial internal device as the cause of abnormal reaction of the patient, or of later complication, without mention of misadventure at the time of the procedure: Secondary | ICD-10-CM | POA: Diagnosis present

## 2018-08-29 DIAGNOSIS — R609 Edema, unspecified: Secondary | ICD-10-CM | POA: Diagnosis not present

## 2018-08-29 DIAGNOSIS — M659 Synovitis and tenosynovitis, unspecified: Secondary | ICD-10-CM | POA: Diagnosis not present

## 2018-08-29 DIAGNOSIS — W57XXXA Bitten or stung by nonvenomous insect and other nonvenomous arthropods, initial encounter: Secondary | ICD-10-CM | POA: Diagnosis present

## 2018-08-29 DIAGNOSIS — Z8261 Family history of arthritis: Secondary | ICD-10-CM

## 2018-08-29 DIAGNOSIS — Z79899 Other long term (current) drug therapy: Secondary | ICD-10-CM

## 2018-08-29 DIAGNOSIS — M170 Bilateral primary osteoarthritis of knee: Secondary | ICD-10-CM | POA: Diagnosis not present

## 2018-08-29 DIAGNOSIS — M00061 Staphylococcal arthritis, right knee: Secondary | ICD-10-CM | POA: Diagnosis not present

## 2018-08-29 DIAGNOSIS — L03115 Cellulitis of right lower limb: Secondary | ICD-10-CM | POA: Diagnosis not present

## 2018-08-29 DIAGNOSIS — R0902 Hypoxemia: Secondary | ICD-10-CM | POA: Diagnosis not present

## 2018-08-29 DIAGNOSIS — Z880 Allergy status to penicillin: Secondary | ICD-10-CM | POA: Diagnosis not present

## 2018-08-29 DIAGNOSIS — Z96653 Presence of artificial knee joint, bilateral: Secondary | ICD-10-CM | POA: Diagnosis not present

## 2018-08-29 DIAGNOSIS — N400 Enlarged prostate without lower urinary tract symptoms: Secondary | ICD-10-CM | POA: Diagnosis present

## 2018-08-29 DIAGNOSIS — M1711 Unilateral primary osteoarthritis, right knee: Secondary | ICD-10-CM | POA: Diagnosis not present

## 2018-08-29 DIAGNOSIS — Z888 Allergy status to other drugs, medicaments and biological substances status: Secondary | ICD-10-CM | POA: Diagnosis not present

## 2018-08-29 DIAGNOSIS — Z9989 Dependence on other enabling machines and devices: Secondary | ICD-10-CM

## 2018-08-29 DIAGNOSIS — A419 Sepsis, unspecified organism: Secondary | ICD-10-CM | POA: Diagnosis not present

## 2018-08-29 DIAGNOSIS — E7800 Pure hypercholesterolemia, unspecified: Secondary | ICD-10-CM | POA: Diagnosis not present

## 2018-08-29 DIAGNOSIS — Z87891 Personal history of nicotine dependence: Secondary | ICD-10-CM

## 2018-08-29 DIAGNOSIS — T8459XA Infection and inflammatory reaction due to other internal joint prosthesis, initial encounter: Secondary | ICD-10-CM | POA: Diagnosis not present

## 2018-08-29 DIAGNOSIS — M79604 Pain in right leg: Secondary | ICD-10-CM | POA: Diagnosis not present

## 2018-08-29 DIAGNOSIS — T8453XD Infection and inflammatory reaction due to internal right knee prosthesis, subsequent encounter: Secondary | ICD-10-CM | POA: Diagnosis not present

## 2018-08-29 DIAGNOSIS — A4901 Methicillin susceptible Staphylococcus aureus infection, unspecified site: Secondary | ICD-10-CM | POA: Diagnosis not present

## 2018-08-29 DIAGNOSIS — B9561 Methicillin susceptible Staphylococcus aureus infection as the cause of diseases classified elsewhere: Secondary | ICD-10-CM | POA: Diagnosis not present

## 2018-08-29 DIAGNOSIS — Z96651 Presence of right artificial knee joint: Secondary | ICD-10-CM | POA: Diagnosis not present

## 2018-08-29 DIAGNOSIS — G4730 Sleep apnea, unspecified: Secondary | ICD-10-CM | POA: Diagnosis not present

## 2018-08-29 DIAGNOSIS — Z95828 Presence of other vascular implants and grafts: Secondary | ICD-10-CM | POA: Diagnosis not present

## 2018-08-29 DIAGNOSIS — K219 Gastro-esophageal reflux disease without esophagitis: Secondary | ICD-10-CM | POA: Diagnosis not present

## 2018-08-29 DIAGNOSIS — Z6841 Body Mass Index (BMI) 40.0 and over, adult: Secondary | ICD-10-CM

## 2018-08-29 DIAGNOSIS — E039 Hypothyroidism, unspecified: Secondary | ICD-10-CM | POA: Diagnosis not present

## 2018-08-29 DIAGNOSIS — R0989 Other specified symptoms and signs involving the circulatory and respiratory systems: Secondary | ICD-10-CM | POA: Diagnosis not present

## 2018-08-29 DIAGNOSIS — R509 Fever, unspecified: Secondary | ICD-10-CM | POA: Diagnosis present

## 2018-08-29 DIAGNOSIS — Z8249 Family history of ischemic heart disease and other diseases of the circulatory system: Secondary | ICD-10-CM

## 2018-08-29 DIAGNOSIS — Z419 Encounter for procedure for purposes other than remedying health state, unspecified: Secondary | ICD-10-CM

## 2018-08-29 HISTORY — DX: Other specified health status: Z78.9

## 2018-08-29 LAB — CBC WITH DIFFERENTIAL/PLATELET
Abs Immature Granulocytes: 0.1 10*3/uL (ref 0.0–0.1)
Basophils Absolute: 0 10*3/uL (ref 0.0–0.1)
Basophils Relative: 0 %
Eosinophils Absolute: 0 10*3/uL (ref 0.0–0.7)
Eosinophils Relative: 0 %
HEMATOCRIT: 44.8 % (ref 39.0–52.0)
HEMOGLOBIN: 15 g/dL (ref 13.0–17.0)
IMMATURE GRANULOCYTES: 1 %
LYMPHS ABS: 0.6 10*3/uL — AB (ref 0.7–4.0)
LYMPHS PCT: 5 %
MCH: 31.1 pg (ref 26.0–34.0)
MCHC: 33.5 g/dL (ref 30.0–36.0)
MCV: 92.8 fL (ref 78.0–100.0)
MONOS PCT: 9 %
Monocytes Absolute: 1.1 10*3/uL — ABNORMAL HIGH (ref 0.1–1.0)
Neutro Abs: 10.9 10*3/uL — ABNORMAL HIGH (ref 1.7–7.7)
Neutrophils Relative %: 85 %
Platelets: 190 10*3/uL (ref 150–400)
RBC: 4.83 MIL/uL (ref 4.22–5.81)
RDW: 12.7 % (ref 11.5–15.5)
WBC: 12.7 10*3/uL — AB (ref 4.0–10.5)

## 2018-08-29 LAB — COMPREHENSIVE METABOLIC PANEL
ALT: 32 U/L (ref 0–44)
AST: 23 U/L (ref 15–41)
Albumin: 3.6 g/dL (ref 3.5–5.0)
Alkaline Phosphatase: 66 U/L (ref 38–126)
Anion gap: 16 — ABNORMAL HIGH (ref 5–15)
BUN: 10 mg/dL (ref 6–20)
CHLORIDE: 99 mmol/L (ref 98–111)
CO2: 22 mmol/L (ref 22–32)
Calcium: 9 mg/dL (ref 8.9–10.3)
Creatinine, Ser: 0.96 mg/dL (ref 0.61–1.24)
GFR calc Af Amer: >60 mL/min (ref >60)
Glucose, Bld: 107 mg/dL — ABNORMAL HIGH (ref 70–99)
Potassium: 4.5 mmol/L (ref 3.5–5.1)
SODIUM: 137 mmol/L (ref 135–145)
Total Bilirubin: 1.2 mg/dL (ref 0.3–1.2)
Total Protein: 6.8 g/dL (ref 6.5–8.1)

## 2018-08-29 LAB — SYNOVIAL CELL COUNT + DIFF, W/ CRYSTALS
Crystals, Fluid: NONE SEEN
LYMPHOCYTES-SYNOVIAL FLD: 3 % (ref 0–20)
Neutrophil, Synovial: 97 % — ABNORMAL HIGH (ref 0–25)
WBC, Synovial: 100500 /mm3 — ABNORMAL HIGH (ref 0–200)

## 2018-08-29 LAB — HEMOGLOBIN A1C
Hgb A1c MFr Bld: 5.4 % (ref 4.8–5.6)
MEAN PLASMA GLUCOSE: 108.28 mg/dL

## 2018-08-29 MED ORDER — NEBIVOLOL HCL 10 MG PO TABS
10.0000 mg | ORAL_TABLET | Freq: Every day | ORAL | Status: DC
  Administered 2018-08-31 – 2018-09-02 (×3): 10 mg via ORAL
  Filled 2018-08-29 (×4): qty 1

## 2018-08-29 MED ORDER — CEFAZOLIN SODIUM-DEXTROSE 2-4 GM/100ML-% IV SOLN
2.0000 g | Freq: Three times a day (TID) | INTRAVENOUS | Status: DC
  Administered 2018-08-29 – 2018-08-31 (×6): 2 g via INTRAVENOUS
  Filled 2018-08-29 (×7): qty 100

## 2018-08-29 MED ORDER — TAMSULOSIN HCL 0.4 MG PO CAPS
0.4000 mg | ORAL_CAPSULE | Freq: Every day | ORAL | Status: DC
  Administered 2018-08-29 – 2018-09-01 (×4): 0.4 mg via ORAL
  Filled 2018-08-29 (×5): qty 1

## 2018-08-29 MED ORDER — DOCUSATE SODIUM 100 MG PO CAPS
100.0000 mg | ORAL_CAPSULE | Freq: Two times a day (BID) | ORAL | Status: DC
  Administered 2018-08-29: 100 mg via ORAL
  Filled 2018-08-29 (×2): qty 1

## 2018-08-29 MED ORDER — VANCOMYCIN HCL 10 G IV SOLR
1500.0000 mg | Freq: Two times a day (BID) | INTRAVENOUS | Status: DC
  Administered 2018-08-30 – 2018-08-31 (×3): 1500 mg via INTRAVENOUS
  Filled 2018-08-29 (×4): qty 1500

## 2018-08-29 MED ORDER — ADULT MULTIVITAMIN W/MINERALS CH
1.0000 | ORAL_TABLET | Freq: Every day | ORAL | Status: DC
  Administered 2018-08-29 – 2018-09-02 (×4): 1 via ORAL
  Filled 2018-08-29 (×4): qty 1

## 2018-08-29 MED ORDER — VANCOMYCIN HCL 10 G IV SOLR
2500.0000 mg | Freq: Once | INTRAVENOUS | Status: AC
  Administered 2018-08-29: 2500 mg via INTRAVENOUS
  Filled 2018-08-29: qty 2500

## 2018-08-29 MED ORDER — LEVOTHYROXINE SODIUM 100 MCG PO TABS
300.0000 ug | ORAL_TABLET | Freq: Every day | ORAL | Status: DC
  Administered 2018-08-31 – 2018-09-02 (×3): 300 ug via ORAL
  Filled 2018-08-29: qty 3
  Filled 2018-08-29: qty 2
  Filled 2018-08-29: qty 3

## 2018-08-29 MED ORDER — POLYETHYLENE GLYCOL 3350 17 G PO PACK
17.0000 g | PACK | Freq: Every day | ORAL | Status: DC
  Administered 2018-08-29 – 2018-09-02 (×4): 17 g via ORAL
  Filled 2018-08-29 (×4): qty 1

## 2018-08-29 MED ORDER — POTASSIUM CHLORIDE IN NACL 20-0.9 MEQ/L-% IV SOLN
INTRAVENOUS | Status: DC
  Administered 2018-08-29: 18:00:00 via INTRAVENOUS
  Filled 2018-08-29: qty 1000

## 2018-08-29 MED ORDER — VANCOMYCIN HCL 10 G IV SOLR: 1500.0000 mg | Freq: Once | INTRAVENOUS | Status: DC

## 2018-08-29 MED ORDER — FOLIC ACID 1 MG PO TABS
1.0000 mg | ORAL_TABLET | Freq: Every day | ORAL | Status: DC
  Administered 2018-08-29 – 2018-09-02 (×4): 1 mg via ORAL
  Filled 2018-08-29 (×4): qty 1

## 2018-08-29 MED ORDER — ACETAMINOPHEN 325 MG PO TABS
650.0000 mg | ORAL_TABLET | Freq: Four times a day (QID) | ORAL | Status: DC | PRN
  Administered 2018-08-29 – 2018-08-30 (×2): 650 mg via ORAL
  Filled 2018-08-29 (×2): qty 2

## 2018-08-29 MED ORDER — FAMOTIDINE 20 MG PO TABS
20.0000 mg | ORAL_TABLET | Freq: Two times a day (BID) | ORAL | Status: DC
  Administered 2018-08-29 – 2018-09-02 (×7): 20 mg via ORAL
  Filled 2018-08-29 (×7): qty 1

## 2018-08-29 MED ORDER — LISINOPRIL 20 MG PO TABS
20.0000 mg | ORAL_TABLET | Freq: Every day | ORAL | Status: DC
  Administered 2018-08-31 – 2018-09-02 (×3): 20 mg via ORAL
  Filled 2018-08-29 (×3): qty 1

## 2018-08-29 MED ORDER — LORATADINE 10 MG PO TABS
10.0000 mg | ORAL_TABLET | Freq: Every day | ORAL | Status: DC
  Administered 2018-08-31 – 2018-09-02 (×3): 10 mg via ORAL
  Filled 2018-08-29 (×3): qty 1

## 2018-08-29 MED ORDER — ACETAMINOPHEN 650 MG RE SUPP: 650.0000 mg | Freq: Four times a day (QID) | RECTAL | Status: DC | PRN

## 2018-08-29 MED ORDER — OXYCODONE HCL 5 MG PO TABS
5.0000 mg | ORAL_TABLET | ORAL | Status: DC | PRN
  Administered 2018-08-29 – 2018-08-30 (×4): 5 mg via ORAL
  Filled 2018-08-29 (×4): qty 1

## 2018-08-29 NOTE — Plan of Care (Signed)

## 2018-08-29 NOTE — Progress Notes (Signed)
Pharmacy Antibiotic Note  Bryan [name] is a 52 y.o. male admitted on 08/29/2018 with infected R-knee.  Pharmacy has been consulted for Vancomycin dosing.  SCr is 0.96 with normalized CrCl ~92 mL/min.  Patient is afebrile (Tm 99.4). WBC 12.7  Plan: Vancomycin 2500mg  IV x1 (17mg /kg) then 1500mg  IV every 12 hours.  Monitor renal function, culture results, and clinical status.  Check vancomycin trough at Css.  Height: 6\' 2"  (188 cm) Weight: (!) 320 lb 5.3 oz (145.3 kg) IBW/kg (Calculated) : 82.2  Temp (24hrs), Avg:99.4 F (37.4 C), Min:99.4 F (37.4 C), Max:99.4 F (37.4 C)  No results for input(s): WBC, CREATININE, LATICACIDVEN, VANCOTROUGH, VANCOPEAK, VANCORANDOM, GENTTROUGH, GENTPEAK, GENTRANDOM, TOBRATROUGH, TOBRAPEAK, TOBRARND, AMIKACINPEAK, AMIKACINTROU, AMIKACIN in the last 168 hours.  CrCl cannot be calculated (Patient's most recent lab result is older than the maximum 21 days allowed.).    Allergies  Allergen Reactions  . Lipitor [Atorvastatin Calcium] Other (See Comments)    MYALGIAS Muscle aches.  . Penicillins Hives and Swelling    SWELLING REACTION UNSPECIFIED  Has patient had a PCN reaction causing immediate rash, facial/tongue/throat swelling, SOB or lightheadedness with hypotension:unsure Has patient had a PCN reaction causing severe rash involving mucus membranes or skin necrosis:unsure Has patient had a PCN reaction that required hospitalization:No Has patient had a PCN reaction occurring within the last 10 years:No If all of the above answers are "NO", then may proceed with Cephalosporin use.     Antimicrobials this admission: Vancomycin 9/14 >>  Dose adjustments this admission:   Microbiology results: 9/14 BCx:  9/14 UCx:    Thank you for allowing pharmacy to be a part of this patient's care.  Link Snuffer, PharmD, BCPS, BCCCP Clinical Pharmacist Clinical phone 08/29/2018 until 3:30PM [phone] Please refer to [facility] for [facility]  numbers 08/29/2018 12:46 PM

## 2018-08-29 NOTE — Progress Notes (Signed)
51 you male with acute right leg swelling during after flights for work.

## 2018-08-29 NOTE — Progress Notes (Signed)
Pt has home CPAP at bedside. RT inspected machine/cords for any frays. Water added to chamber pr pt's request. Pt able to place mask on himself.  No further assistance needed at this time. Advised pt to have RT notified if any complications with machine arise.

## 2018-08-29 NOTE — Progress Notes (Signed)
VASCULAR LAB PRELIMINARY  PRELIMINARY  PRELIMINARY  PRELIMINARY  Right lower extremity venous duplex completed.    Preliminary report:  There is no DVT or SVT noted in the right lower extremity.  There are multiple enlarged lymph nodes noted in the groin and proximal thigh.  Called results to [employer], PA-C  Evrett Hakim, RVT 08/29/2018, 11:58 AM

## 2018-08-30 ENCOUNTER — Inpatient Hospital Stay (HOSPITAL_COMMUNITY): Payer: BLUE CROSS/BLUE SHIELD | Admitting: Certified Registered Nurse Anesthetist

## 2018-08-30 ENCOUNTER — Inpatient Hospital Stay (HOSPITAL_COMMUNITY): Payer: BLUE CROSS/BLUE SHIELD

## 2018-08-30 ENCOUNTER — Inpatient Hospital Stay: Payer: Self-pay

## 2018-08-30 ENCOUNTER — Encounter (HOSPITAL_COMMUNITY)
Admission: AD | Disposition: A | Discharge status: Home Health | Payer: Self-pay | Source: Ambulatory Visit | Attending: Orthopedic Surgery

## 2018-08-30 ENCOUNTER — Ambulatory Visit: Admission: EM | Admit: 2018-08-30 | Payer: BLUE CROSS/BLUE SHIELD | Admitting: Orthopedic Surgery

## 2018-08-30 DIAGNOSIS — R0902 Hypoxemia: Secondary | ICD-10-CM

## 2018-08-30 HISTORY — PX: I & D KNEE WITH POLY EXCHANGE: SHX5024

## 2018-08-30 LAB — GLUCOSE, CAPILLARY
GLUCOSE-CAPILLARY: 173 mg/dL — AB (ref 70–99)
Glucose-Capillary: 187 mg/dL — ABNORMAL HIGH (ref 70–99)

## 2018-08-30 LAB — BASIC METABOLIC PANEL
ANION GAP: 9 (ref 5–15)
BUN: 9 mg/dL (ref 6–20)
CALCIUM: 8.2 mg/dL — AB (ref 8.9–10.3)
CO2: 23 mmol/L (ref 22–32)
CREATININE: 0.97 mg/dL (ref 0.61–1.24)
Chloride: 102 mmol/L (ref 98–111)
GFR calc Af Amer: >60 mL/min (ref >60)
GLUCOSE: 109 mg/dL — AB (ref 70–99)
Potassium: 3.5 mmol/L (ref 3.5–5.1)
Sodium: 134 mmol/L — ABNORMAL LOW (ref 135–145)

## 2018-08-30 LAB — CBC
HEMATOCRIT: 38.5 % — AB (ref 39.0–52.0)
Hemoglobin: 13 g/dL (ref 13.0–17.0)
MCH: 31.3 pg (ref 26.0–34.0)
MCHC: 33.8 g/dL (ref 30.0–36.0)
MCV: 92.5 fL (ref 78.0–100.0)
PLATELETS: 180 10*3/uL (ref 150–400)
RBC: 4.16 MIL/uL — ABNORMAL LOW (ref 4.22–5.81)
RDW: 13 % (ref 11.5–15.5)
WBC: 11.4 10*3/uL — AB (ref 4.0–10.5)

## 2018-08-30 LAB — HIV ANTIBODY (ROUTINE TESTING W REFLEX): HIV Screen 4th Generation wRfx: NONREACTIVE

## 2018-08-30 LAB — MRSA PCR SCREENING: MRSA by PCR: NEGATIVE

## 2018-08-30 SURGERY — IRRIGATION AND DEBRIDEMENT KNEE WITH POLY EXCHANGE
Anesthesia: General | Site: Knee | Laterality: Right

## 2018-08-30 MED ORDER — 0.9 % SODIUM CHLORIDE (POUR BTL) OPTIME
TOPICAL | Status: DC | PRN
  Administered 2018-08-30: 1000 mL

## 2018-08-30 MED ORDER — OXYCHLOROSENE SODIUM POWD
Status: DC
  Filled 2018-08-30 (×2): qty 2

## 2018-08-30 MED ORDER — ALUM & MAG HYDROXIDE-SIMETH 200-200-20 MG/5ML PO SUSP
30.0000 mL | ORAL | Status: DC | PRN
  Filled 2018-08-30: qty 30

## 2018-08-30 MED ORDER — LIDOCAINE 2% (20 MG/ML) 5 ML SYRINGE
INTRAMUSCULAR | Status: DC | PRN
  Administered 2018-08-30: 80 mg via INTRAVENOUS

## 2018-08-30 MED ORDER — TOBRAMYCIN SULFATE 1.2 G IJ SOLR
1.2000 g | INTRAMUSCULAR | Status: DC
  Filled 2018-08-30 (×2): qty 1.2

## 2018-08-30 MED ORDER — FENTANYL CITRATE (PF) 100 MCG/2ML IJ SOLN
INTRAMUSCULAR | Status: DC | PRN
  Administered 2018-08-30 (×9): 50 ug via INTRAVENOUS

## 2018-08-30 MED ORDER — ALBUTEROL SULFATE HFA 108 (90 BASE) MCG/ACT IN AERS
INHALATION_SPRAY | RESPIRATORY_TRACT | Status: DC | PRN
  Administered 2018-08-30 (×2): 8 via RESPIRATORY_TRACT

## 2018-08-30 MED ORDER — MENTHOL 3 MG MT LOZG
1.0000 | LOZENGE | OROMUCOSAL | Status: DC | PRN
  Filled 2018-08-30: qty 9

## 2018-08-30 MED ORDER — MIDAZOLAM HCL 5 MG/5ML IJ SOLN
INTRAMUSCULAR | Status: DC | PRN
  Administered 2018-08-30: 2 mg via INTRAVENOUS

## 2018-08-30 MED ORDER — DEXAMETHASONE SODIUM PHOSPHATE 10 MG/ML IJ SOLN
INTRAMUSCULAR | Status: DC | PRN
  Administered 2018-08-30: 10 mg via INTRAVENOUS

## 2018-08-30 MED ORDER — OXYCHLOROSENE SODIUM POWD
Status: DC | PRN
  Administered 2018-08-30: 1

## 2018-08-30 MED ORDER — SUCCINYLCHOLINE CHLORIDE 200 MG/10ML IV SOSY
PREFILLED_SYRINGE | INTRAVENOUS | Status: DC | PRN
  Administered 2018-08-30: 140 mg via INTRAVENOUS

## 2018-08-30 MED ORDER — METOCLOPRAMIDE HCL 5 MG/ML IJ SOLN
5.0000 mg | Freq: Three times a day (TID) | INTRAMUSCULAR | Status: DC | PRN
  Filled 2018-08-30: qty 2

## 2018-08-30 MED ORDER — FENTANYL CITRATE (PF) 250 MCG/5ML IJ SOLN
INTRAMUSCULAR | Status: AC
  Filled 2018-08-30: qty 5

## 2018-08-30 MED ORDER — PROPOFOL 10 MG/ML IV BOLUS
INTRAVENOUS | Status: DC | PRN
  Administered 2018-08-30: 100 mg via INTRAVENOUS
  Administered 2018-08-30: 200 mg via INTRAVENOUS

## 2018-08-30 MED ORDER — LIDOCAINE 2% (20 MG/ML) 5 ML SYRINGE
INTRAMUSCULAR | Status: AC
  Filled 2018-08-30: qty 5

## 2018-08-30 MED ORDER — CEFUROXIME SODIUM 750 MG IJ SOLR
INTRAMUSCULAR | Status: AC
  Filled 2018-08-30: qty 1500

## 2018-08-30 MED ORDER — PROPOFOL 10 MG/ML IV BOLUS
INTRAVENOUS | Status: AC
  Filled 2018-08-30: qty 20

## 2018-08-30 MED ORDER — ACETAMINOPHEN 10 MG/ML IV SOLN
INTRAVENOUS | Status: AC
  Filled 2018-08-30: qty 100

## 2018-08-30 MED ORDER — ONDANSETRON HCL 4 MG/2ML IJ SOLN: 4.0000 mg | Freq: Once | INTRAMUSCULAR | Status: DC | PRN

## 2018-08-30 MED ORDER — SODIUM CHLORIDE 0.9 % IR SOLN
Status: DC | PRN
  Administered 2018-08-30: 9000 mL

## 2018-08-30 MED ORDER — ONDANSETRON HCL 4 MG/2ML IJ SOLN
INTRAMUSCULAR | Status: AC
  Filled 2018-08-30: qty 2

## 2018-08-30 MED ORDER — APIXABAN 2.5 MG PO TABS
2.5000 mg | ORAL_TABLET | Freq: Two times a day (BID) | ORAL | Status: DC
  Administered 2018-08-31 – 2018-09-02 (×5): 2.5 mg via ORAL
  Filled 2018-08-30 (×6): qty 1

## 2018-08-30 MED ORDER — ONDANSETRON HCL 4 MG/2ML IJ SOLN
INTRAMUSCULAR | Status: DC | PRN
  Administered 2018-08-30: 4 mg via INTRAVENOUS

## 2018-08-30 MED ORDER — DEXAMETHASONE SODIUM PHOSPHATE 10 MG/ML IJ SOLN
10.0000 mg | Freq: Once | INTRAMUSCULAR | Status: AC
  Administered 2018-08-30: 10 mg via INTRAVENOUS
  Filled 2018-08-30 (×2): qty 1

## 2018-08-30 MED ORDER — ACETAMINOPHEN 10 MG/ML IV SOLN
INTRAVENOUS | Status: DC | PRN
  Administered 2018-08-30: 1000 mg via INTRAVENOUS

## 2018-08-30 MED ORDER — METOCLOPRAMIDE HCL 5 MG PO TABS
5.0000 mg | ORAL_TABLET | Freq: Three times a day (TID) | ORAL | Status: DC | PRN
  Filled 2018-08-30: qty 2

## 2018-08-30 MED ORDER — CEFUROXIME SODIUM 750 MG IJ SOLR
INTRAMUSCULAR | Status: DC | PRN
  Administered 2018-08-30: 1.5 mg

## 2018-08-30 MED ORDER — GABAPENTIN 300 MG PO CAPS
300.0000 mg | ORAL_CAPSULE | Freq: Every day | ORAL | Status: DC
  Administered 2018-08-30 – 2018-09-01 (×3): 300 mg via ORAL
  Filled 2018-08-30 (×4): qty 1

## 2018-08-30 MED ORDER — PROPOFOL 1000 MG/100ML IV EMUL
INTRAVENOUS | Status: AC
  Filled 2018-08-30: qty 100

## 2018-08-30 MED ORDER — DEXMEDETOMIDINE HCL IN NACL 200 MCG/50ML IV SOLN
INTRAVENOUS | Status: DC | PRN
  Administered 2018-08-30 (×5): 8 ug via INTRAVENOUS

## 2018-08-30 MED ORDER — MORPHINE SULFATE (PF) 2 MG/ML IV SOLN
2.0000 mg | Freq: Once | INTRAVENOUS | Status: AC
  Administered 2018-08-30: 2 mg via INTRAVENOUS

## 2018-08-30 MED ORDER — DOCUSATE SODIUM 100 MG PO CAPS
100.0000 mg | ORAL_CAPSULE | Freq: Two times a day (BID) | ORAL | Status: DC
  Administered 2018-08-30 – 2018-09-02 (×6): 100 mg via ORAL
  Filled 2018-08-30 (×8): qty 1

## 2018-08-30 MED ORDER — ACETAMINOPHEN 500 MG PO TABS
1000.0000 mg | ORAL_TABLET | Freq: Four times a day (QID) | ORAL | Status: AC
  Administered 2018-08-30 – 2018-08-31 (×3): 1000 mg via ORAL
  Filled 2018-08-30 (×3): qty 2

## 2018-08-30 MED ORDER — LACTATED RINGERS IV SOLN
INTRAVENOUS | Status: DC | PRN
  Administered 2018-08-30 (×2): via INTRAVENOUS

## 2018-08-30 MED ORDER — PHENOL 1.4 % MT LIQD
1.0000 | OROMUCOSAL | Status: DC | PRN
  Filled 2018-08-30: qty 177

## 2018-08-30 MED ORDER — OXYCODONE HCL 5 MG PO TABS
5.0000 mg | ORAL_TABLET | ORAL | Status: DC | PRN
  Administered 2018-08-30 – 2018-08-31 (×5): 10 mg via ORAL
  Administered 2018-08-31: 5 mg via ORAL
  Administered 2018-08-31: 10 mg via ORAL
  Administered 2018-08-31: 5 mg via ORAL
  Administered 2018-09-01 – 2018-09-02 (×9): 10 mg via ORAL
  Filled 2018-08-30 (×8): qty 2
  Filled 2018-08-30: qty 1
  Filled 2018-08-30 (×2): qty 2
  Filled 2018-08-30: qty 1
  Filled 2018-08-30 (×6): qty 2

## 2018-08-30 MED ORDER — STERILE WATER FOR IRRIGATION IR SOLN
Status: DC | PRN
  Administered 2018-08-30: 1000 mL

## 2018-08-30 MED ORDER — MORPHINE SULFATE (PF) 2 MG/ML IV SOLN
INTRAVENOUS | Status: AC
  Administered 2018-08-30: 2 mg via INTRAVENOUS
  Filled 2018-08-30: qty 1

## 2018-08-30 MED ORDER — DIPHENHYDRAMINE HCL 12.5 MG/5ML PO ELIX
12.5000 mg | ORAL_SOLUTION | ORAL | Status: DC | PRN
  Filled 2018-08-30: qty 10

## 2018-08-30 MED ORDER — ONDANSETRON HCL 4 MG PO TABS: 4.0000 mg | ORAL_TABLET | Freq: Four times a day (QID) | ORAL | Status: DC | PRN

## 2018-08-30 MED ORDER — HYDROMORPHONE HCL 1 MG/ML IJ SOLN
0.5000 mg | INTRAMUSCULAR | Status: DC | PRN
  Administered 2018-08-30: 0.5 mg via INTRAVENOUS
  Filled 2018-08-30: qty 1

## 2018-08-30 MED ORDER — DEXAMETHASONE SODIUM PHOSPHATE 10 MG/ML IJ SOLN
INTRAMUSCULAR | Status: AC
  Filled 2018-08-30: qty 1

## 2018-08-30 MED ORDER — MIDAZOLAM HCL 2 MG/2ML IJ SOLN
INTRAMUSCULAR | Status: AC
  Filled 2018-08-30: qty 2

## 2018-08-30 MED ORDER — ONDANSETRON HCL 4 MG/2ML IJ SOLN: 4.0000 mg | Freq: Four times a day (QID) | INTRAMUSCULAR | Status: DC | PRN

## 2018-08-30 MED ORDER — TRANEXAMIC ACID 1000 MG/10ML IV SOLN
1000.0000 mg | INTRAVENOUS | Status: AC
  Administered 2018-08-30: 1000 mg via INTRAVENOUS
  Filled 2018-08-30: qty 10

## 2018-08-30 MED ORDER — HYDROMORPHONE HCL 1 MG/ML IJ SOLN: 0.2500 mg | INTRAMUSCULAR | Status: DC | PRN

## 2018-08-30 MED ORDER — ESMOLOL HCL 100 MG/10ML IV SOLN
INTRAVENOUS | Status: DC | PRN
  Administered 2018-08-30: 20 mg via INTRAVENOUS

## 2018-08-30 SURGICAL SUPPLY — 67 items
BANDAGE ESMARK 6X9 LF (GAUZE/BANDAGES/DRESSINGS) ×1 IMPLANT
BENZOIN TINCTURE PRP APPL 2/3 (GAUZE/BANDAGES/DRESSINGS) ×2 IMPLANT
BLADE 10 SAFETY STRL DISP (BLADE) ×30 IMPLANT
BNDG ELASTIC 6X15 VLCR STRL LF (GAUZE/BANDAGES/DRESSINGS) ×2 IMPLANT
BNDG ESMARK 6X9 LF (GAUZE/BANDAGES/DRESSINGS) ×2
COVER SURGICAL LIGHT HANDLE (MISCELLANEOUS) ×2 IMPLANT
CUFF TOURNIQUET SINGLE 34IN LL (TOURNIQUET CUFF) ×2 IMPLANT
CUFF TOURNIQUET SINGLE 44IN (TOURNIQUET CUFF) IMPLANT
DRAPE HALF SHEET 40X57 (DRAPES) ×4 IMPLANT
DRAPE IMP U-DRAPE 54X76 (DRAPES) ×2 IMPLANT
DRAPE INCISE IOBAN 66X45 STRL (DRAPES) ×2 IMPLANT
DRAPE U-SHAPE 47X51 STRL (DRAPES) ×2 IMPLANT
DRESSING AQUACEL AG SP 3.5X6 (GAUZE/BANDAGES/DRESSINGS) ×1 IMPLANT
DRSG AQUACEL AG ADV 3.5X14 (GAUZE/BANDAGES/DRESSINGS) ×2 IMPLANT
DRSG AQUACEL AG SP 3.5X6 (GAUZE/BANDAGES/DRESSINGS) ×2
DRSG PAD ABDOMINAL 8X10 ST (GAUZE/BANDAGES/DRESSINGS) ×4 IMPLANT
DURAPREP 26ML APPLICATOR (WOUND CARE) ×4 IMPLANT
ELECT CAUTERY BLADE 6.4 (BLADE) ×2 IMPLANT
ELECT REM PT RETURN 9FT ADLT (ELECTROSURGICAL) ×2
ELECTRODE REM PT RTRN 9FT ADLT (ELECTROSURGICAL) ×1 IMPLANT
EVACUATOR 1/8 PVC DRAIN (DRAIN) ×4 IMPLANT
GAUZE SPONGE 4X4 12PLY STRL (GAUZE/BANDAGES/DRESSINGS) ×2 IMPLANT
GAUZE SPONGE 4X4 16PLY XRAY LF (GAUZE/BANDAGES/DRESSINGS) ×2 IMPLANT
GLOVE BIO SURGEON STRL SZ7 (GLOVE) ×2 IMPLANT
GLOVE BIOGEL PI IND STRL 7.0 (GLOVE) ×1 IMPLANT
GLOVE BIOGEL PI IND STRL 7.5 (GLOVE) ×1 IMPLANT
GLOVE BIOGEL PI INDICATOR 7.0 (GLOVE) ×1
GLOVE BIOGEL PI INDICATOR 7.5 (GLOVE) ×1
GLOVE SS BIOGEL STRL SZ 7.5 (GLOVE) ×1 IMPLANT
GLOVE SUPERSENSE BIOGEL SZ 7.5 (GLOVE) ×1
GOWN STRL REUS W/ TWL LRG LVL3 (GOWN DISPOSABLE) ×2 IMPLANT
GOWN STRL REUS W/ TWL XL LVL3 (GOWN DISPOSABLE) ×2 IMPLANT
GOWN STRL REUS W/TWL LRG LVL3 (GOWN DISPOSABLE) ×2
GOWN STRL REUS W/TWL XL LVL3 (GOWN DISPOSABLE) ×2
HANDPIECE INTERPULSE COAX TIP (DISPOSABLE) ×2
HOOD PEEL AWAY FACE SHEILD DIS (HOOD) ×4 IMPLANT
HYDROGEN PEROXIDE 16OZ (MISCELLANEOUS) ×2 IMPLANT
IMMOBILIZER KNEE 22 UNIV (SOFTGOODS) ×2 IMPLANT
KIT BASIN OR (CUSTOM PROCEDURE TRAY) ×2 IMPLANT
KIT TURNOVER KIT B (KITS) ×2 IMPLANT
MANIFOLD NEPTUNE II (INSTRUMENTS) ×2 IMPLANT
MARKER SKIN DUAL TIP RULER LAB (MISCELLANEOUS) ×2 IMPLANT
NS IRRIG 1000ML POUR BTL (IV SOLUTION) ×2 IMPLANT
PACK TOTAL JOINT (CUSTOM PROCEDURE TRAY) ×2 IMPLANT
PACK UNIVERSAL I (CUSTOM PROCEDURE TRAY) ×2 IMPLANT
PAD ABD 8X10 STRL (GAUZE/BANDAGES/DRESSINGS) ×2 IMPLANT
PAD ARMBOARD 7.5X6 YLW CONV (MISCELLANEOUS) ×4 IMPLANT
PADDING CAST COTTON 6X4 STRL (CAST SUPPLIES) ×2 IMPLANT
PLATE ROT INSERT 12.5MM (Plate) ×2 IMPLANT
RUBBERBAND STERILE (MISCELLANEOUS) ×2 IMPLANT
SET HNDPC FAN SPRY TIP SCT (DISPOSABLE) ×2 IMPLANT
STRIP CLOSURE SKIN 1/2X4 (GAUZE/BANDAGES/DRESSINGS) ×2 IMPLANT
SUT ETHIBOND NAB CT1 #1 30IN (SUTURE) ×4 IMPLANT
SUT MNCRL AB 3-0 PS2 18 (SUTURE) ×4 IMPLANT
SUT VIC AB 0 CT1 27 (SUTURE) ×4
SUT VIC AB 0 CT1 27XBRD ANBCTR (SUTURE) ×4 IMPLANT
SUT VIC AB 1 CT1 27 (SUTURE) ×1
SUT VIC AB 1 CT1 27XBRD ANBCTR (SUTURE) ×1 IMPLANT
SUT VIC AB 2-0 CT1 27 (SUTURE) ×4
SUT VIC AB 2-0 CT1 TAPERPNT 27 (SUTURE) ×4 IMPLANT
SYR 30ML SLIP (SYRINGE) ×2 IMPLANT
TAPE STRIPS DRAPE STRL (GAUZE/BANDAGES/DRESSINGS) ×2 IMPLANT
TOWEL OR 17X24 6PK STRL BLUE (TOWEL DISPOSABLE) ×2 IMPLANT
TOWEL OR 17X26 10 PK STRL BLUE (TOWEL DISPOSABLE) ×2 IMPLANT
TRAY FOLEY CATH SILVER 16FR (SET/KITS/TRAYS/PACK) ×2 IMPLANT
TUBE CONNECTING 12X1/4 (SUCTIONS) ×2 IMPLANT
WATER STERILE IRR 1000ML POUR (IV SOLUTION) ×4 IMPLANT

## 2018-08-30 NOTE — H&P (Signed)
Bryan [name] is an 52 y.o. male.   Chief Complaint: infected right total knee replacement HPI: Patient is a year and a half s/p right total knee.  This week he was in [place] for a conference.  He states he scratched multiple bug bites and began having redness and swelling in his leg on Wednesday.  By Friday Evening he flew home he was having right knee pain and fever, chills and night sweats.  He was seen at the orthopedic urgent care Saturday.  Aspiration of his knee reveal obvious pus.  Patient was admitted.  Cell count of his synovial fluid was greater than 100,000 white cells.  He will go to the OR for Incision, irrigation and debridement as well as a poly exchange of his right total knee.  Past Medical History:  Diagnosis Date  . Allergy    takes Claritin daily  . Elevated cholesterol    takes Crestor daily  . Enlarged prostate    takes Flomax nightly  . GERD (gastroesophageal reflux disease)    takes Zantac daily  . Hypertension    takes Lisinopril and Bystolic daily  . Hypothyroidism    takes Synthroid daily  . Medical history non-contributory   . Neuromuscular disorder (HCC)    bells palsy  . OSA (obstructive sleep apnea)    uses CPAP  . Primary localized osteoarthritis of left knee 03/25/2017  . Primary localized osteoarthritis of right knee   . S/P total knee arthroplasty, right 03/25/2017  . Vitamin D deficiency     Past Surgical History:  Procedure Laterality Date  . JOINT REPLACEMENT    . MENISCUS REPAIR Right 2017   torn meniscus  . NO PAST SURGERIES    . TOTAL KNEE ARTHROPLASTY Right 02/26/2017   Procedure: TOTAL KNEE ARTHROPLASTY;  Surgeon: Elsie Saas, MD;  Location: [facility];  Service: Orthopedics;  Laterality: Right;  . TOTAL KNEE ARTHROPLASTY Left 04/07/2017  . TOTAL KNEE ARTHROPLASTY Left 04/07/2017   Procedure: LEFT TOTAL KNEE ARTHROPLASTY;  Surgeon: Elsie Saas, MD;  Location: [facility];  Service: Orthopedics;  Laterality: Left;    Family History   Problem Relation Age of Onset  . Heart attack Father        stent x 2  . Atrial fibrillation Father   . Deep vein thrombosis Mother   . Rheum arthritis Mother   . Pulmonary embolism Unknown   . Hypothyroidism Unknown    Social History:  reports that he has never smoked. He quit smokeless tobacco use about 11 years ago. He reports that he drinks about 3.0 standard drinks of alcohol per week. He reports that he does not use drugs.  Allergies:  Allergies  Allergen Reactions  . Lipitor [Atorvastatin Calcium] Other (See Comments)    Muscle aches  . Penicillins Hives and Swelling    PATIENT HAS HAD KEFLEX WITH NO REACTION Swells "all over" Has patient had a PCN reaction causing immediate rash, facial/tongue/throat swelling, SOB or lightheadedness with hypotension: Yes Has patient had a PCN reaction causing severe rash involving mucus membranes or skin necrosis: Unk Has patient had a PCN reaction that required hospitalization: No, but went to Urgent Care Has patient had a PCN reaction occurring within the last 10 years: No If all of the above answers are "NO", then may proceed    Facility-Administered Medications Prior to Admission  Medication Dose Route Frequency Provider Last Rate Last Dose  . vancomycin (VANCOCIN) 1,500 mg in sodium chloride 0.9 % 500 mL IVPB  1,500 mg Intravenous Once Amana Bouska, PA-C       Medications Prior to Admission  Medication Sig Dispense Refill  . acetaminophen (TYLENOL) 500 MG tablet Take 500-1,000 mg by mouth every 6 (six) hours as needed (for pain.).    Marland Kitchen cetirizine (ZYRTEC) 10 MG tablet Take 10 mg by mouth See admin instructions. Take 10 mg by mouth once a day when not taking Claritin    . clobetasol ointment (TEMOVATE) 0.26 % Apply 1 application topically daily as needed (to affected areas for psoriasis).     Marland Kitchen diclofenac (VOLTAREN) 75 MG EC tablet Take 75 mg by mouth 2 (two) times daily as needed (for inflammation).   1  . docusate sodium  (COLACE) 100 MG capsule 1 tab 2 times a day while on narcotics.  STOOL SOFTENER (Patient taking differently: Take 100 mg by mouth 2 (two) times daily as needed (only when taking narcotic pain meds). ) 60 capsule 0  . Emollient (MOISTURIZING LOTION EX) Apply 1 application topically 3 (three) times daily as needed (for dry skin- O'Keeffe's Skin Repair).     Marland Kitchen levothyroxine (SYNTHROID, LEVOTHROID) 300 MCG tablet Take 300 mcg by mouth daily before breakfast.     . lisinopril (PRINIVIL,ZESTRIL) 20 MG tablet Take 20 mg by mouth daily.     Marland Kitchen loratadine (CLARITIN) 10 MG tablet Take 10 mg by mouth daily.    . naproxen sodium (ALEVE) 220 MG tablet Take 200-440 mg by mouth 2 (two) times daily as needed (for pain).    . nebivolol (BYSTOLIC) 10 MG tablet Take 10 mg by mouth daily.    . ranitidine (ZANTAC) 75 MG tablet Take 75-150 mg by mouth See admin instructions. Take 75 mg by mouth in the morning then 75 mg in the afternoon then 150 mg at bedtime    . rosuvastatin (CRESTOR) 20 MG tablet Take 20 mg by mouth at bedtime.     . tamsulosin (FLOMAX) 0.4 MG CAPS capsule Take 0.4 mg by mouth at bedtime.    Marland Kitchen aspirin EC 325 MG tablet Take 1 tablet (325 mg total) by mouth daily. 1 tab a day for the next 30 days to prevent blood clots (Patient not taking: Reported on 08/29/2018) 30 tablet 0  . oxyCODONE (ROXICODONE) 5 MG immediate release tablet Take 1-2 tabs po q 4-6 hours prn pain (Patient not taking: Reported on 08/29/2018) 60 tablet 0  . polyethylene glycol (MIRALAX / GLYCOLAX) packet 17grams in 6 oz of water twice a day until bowel movement.  LAXITIVE.  Restart if two days since last bowel movement (Patient not taking: Reported on 08/29/2018) 14 each 0    Results for orders placed or performed during the hospital encounter of 08/29/18 (from the past 48 hour(s))  Body fluid culture     Status: None (Preliminary result)   Collection Time: 08/29/18 12:10 PM  Result Value Ref Range   Specimen Description SYNOVIAL     Special Requests Normal    Gram Stain      ABUNDANT WBC PRESENT, PREDOMINANTLY PMN RARE GRAM POSITIVE COCCI IN CLUSTERS Performed at [facility], [place] [address], [place], [place] [address]    Culture PENDING    Report Status PENDING   Comprehensive metabolic panel     Status: Abnormal   Collection Time: 08/29/18 12:17 PM  Result Value Ref Range   Sodium 137 135 - 145 mmol/L   Potassium 4.5 3.5 - 5.1 mmol/L   Chloride 99 98 - 111 mmol/L  CO2 22 22 - 32 mmol/L   Glucose, Bld 107 (H) 70 - 99 mg/dL   BUN 10 6 - 20 mg/dL   Creatinine, Ser 0.96 0.61 - 1.24 mg/dL   Calcium 9.0 8.9 - 10.3 mg/dL   Total Protein 6.8 6.5 - 8.1 g/dL   Albumin 3.6 3.5 - 5.0 g/dL   AST 23 15 - 41 U/L   ALT 32 0 - 44 U/L   Alkaline Phosphatase 66 38 - 126 U/L   Total Bilirubin 1.2 0.3 - 1.2 mg/dL   GFR calc non Af Amer >60 >60 mL/min   GFR calc Af Amer >60 >60 mL/min    Comment: (NOTE) The eGFR has been calculated using the CKD EPI equation. This calculation has not been validated in all clinical situations. eGFR's persistently <60 mL/min signify possible Chronic Kidney Disease.    Anion gap 16 (H) 5 - 15    Comment: Performed at [facility], [place] [address], [place], [place] [address]  CBC WITH DIFFERENTIAL     Status: Abnormal   Collection Time: 08/29/18 12:17 PM  Result Value Ref Range   WBC 12.7 (H) 4.0 - 10.5 K/uL   RBC 4.83 4.22 - 5.81 MIL/uL   Hemoglobin 15.0 13.0 - 17.0 g/dL   HCT 44.8 39.0 - 52.0 %   MCV 92.8 78.0 - 100.0 fL   MCH 31.1 26.0 - 34.0 pg   MCHC 33.5 30.0 - 36.0 g/dL   RDW 12.7 11.5 - 15.5 %   Platelets 190 150 - 400 K/uL   Neutrophils Relative % 85 %   Neutro Abs 10.9 (H) 1.7 - 7.7 K/uL   Lymphocytes Relative 5 %   Lymphs Abs 0.6 (L) 0.7 - 4.0 K/uL   Monocytes Relative 9 %   Monocytes Absolute 1.1 (H) 0.1 - 1.0 K/uL   Eosinophils Relative 0 %   Eosinophils Absolute 0.0 0.0 - 0.7 K/uL   Basophils Relative 0 %   Basophils Absolute 0.0 0.0 - 0.1 K/uL    Immature Granulocytes 1 %   Abs Immature Granulocytes 0.1 0.0 - 0.1 K/uL    Comment: Performed at [facility], [place] [address] [place], [place] [address]  Hemoglobin A1c     Status: None   Collection Time: 08/29/18 12:17 PM  Result Value Ref Range   Hgb A1c MFr Bld 5.4 4.8 - 5.6 %    Comment: (NOTE) Pre diabetes:          5.7%-6.4% Diabetes:              >6.4% Glycemic control for   <7.0% adults with diabetes    Mean Plasma Glucose 108.28 mg/dL    Comment: Performed at [facility] [address] [place], [place] [address]  CBC     Status: Abnormal   Collection Time: 08/30/18  3:19 AM  Result Value Ref Range   WBC 11.4 (H) 4.0 - 10.5 K/uL   RBC 4.16 (L) 4.22 - 5.81 MIL/uL   Hemoglobin 13.0 13.0 - 17.0 g/dL   HCT 38.5 (L) 39.0 - 52.0 %   MCV 92.5 78.0 - 100.0 fL   MCH 31.3 26.0 - 34.0 pg   MCHC 33.8 30.0 - 36.0 g/dL   RDW 13.0 11.5 - 15.5 %   Platelets 180 150 - 400 K/uL    Comment: Performed at [facility], [place] [address] [place], [place] [address]  Basic metabolic panel     Status:  Abnormal   Collection Time: 08/30/18  3:19 AM  Result Value Ref Range   Sodium 134 (L) 135 - 145 mmol/L   Potassium 3.5 3.5 - 5.1 mmol/L   Chloride 102 98 - 111 mmol/L   CO2 23 22 - 32 mmol/L   Glucose, Bld 109 (H) 70 - 99 mg/dL   BUN 9 6 - 20 mg/dL   Creatinine, Ser 0.97 0.61 - 1.24 mg/dL   Calcium 8.2 (L) 8.9 - 10.3 mg/dL   GFR calc non Af Amer >60 >60 mL/min   GFR calc Af Amer >60 >60 mL/min    Comment: (NOTE) The eGFR has been calculated using the CKD EPI equation. This calculation has not been validated in all clinical situations. eGFR's persistently <60 mL/min signify possible Chronic Kidney Disease.    Anion gap 9 5 - 15    Comment: Performed at [facility] [address] [place], [place] [address]   No results found.  Review of Systems  Constitutional: Positive for chills, diaphoresis and malaise/fatigue.  HENT: Negative for congestion, ear  discharge, ear pain, hearing loss, nosebleeds, sinus pain, sore throat and tinnitus.   Eyes: Negative for blurred vision, double vision, photophobia, pain, discharge and redness.  Respiratory: Negative for cough, hemoptysis, sputum production, shortness of breath, wheezing and stridor.   Cardiovascular: Positive for leg swelling. Negative for chest pain, palpitations, orthopnea, claudication and PND.  Gastrointestinal: Negative for abdominal pain, blood in stool, constipation, diarrhea, heartburn, melena, nausea and vomiting.  Genitourinary: Negative.   Musculoskeletal: Positive for joint pain.  Skin:       Multiple scabs on his lower leg with swelling and redness  Neurological: Negative.   Endo/Heme/Allergies: Negative.   Psychiatric/Behavioral: Negative.     Blood pressure 131/60, pulse 86, temperature 98.3 F (36.8 C), temperature source Oral, resp. rate 20, height '6\' 2"'  (1.88 m), weight (!) 145.3 kg, SpO2 92 %. Physical Exam  Constitutional: He is oriented to person, place, and time. He appears well-developed and well-nourished.  HENT:  Head: Normocephalic and atraumatic.  Mouth/Throat: Oropharynx is clear and moist.  Eyes: Pupils are equal, round, and reactive to light. Conjunctivae are normal.  Neck: Neck supple.  Cardiovascular: Normal rate and regular rhythm.  Respiratory: Effort normal and breath sounds normal.  GI: Soft. Bowel sounds are normal.  Genitourinary:  Genitourinary Comments: Not pertinent to current symptomatology therefore not examined.  Musculoskeletal:  Right knee range of motion -5 to 90 with pain and swelling.  Significant lower extremity edema.  Left knee has full range of motion with well healed left total knee incision  Neurological: He is alert and oriented to person, place, and time.  Skin: Skin is warm and dry. There is erythema.  Psychiatric: He has a normal mood and affect. His behavior is normal.     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  PICC line placement  Eulogia Dismore J Malai Lady, PA-C 08/30/2018, 5:27 AM

## 2018-08-30 NOTE — Progress Notes (Signed)
Patient transporting to surgery.

## 2018-08-30 NOTE — Op Note (Signed)
NAME: Bryan [name], Bryan [name] MEDICAL RECORD ZO:[id number] ACCOUNT [id number] DATE OF BIRTH:[birth year] FACILITY: [facility] LOCATION: [facility] PHYSICIAN:Jacody Beneke Salley Slaughter, MD  OPERATIVE REPORT  DATE OF PROCEDURE:  08/30/2018  PREOPERATIVE DIAGNOSIS:  Right total knee infection.  POSTOPERATIVE DIAGNOSIS:  Right total knee infection.  PROCEDURE:  Right total knee examination under anesthesia followed by open arthrotomy with arthroscopic lavage with polyethylene exchange.  SURGEON:  Salvatore Marvel, MD  ASSISTANT:  Heidi Dach, PA  ANESTHESIA:  General.  OPERATIVE TIME:  1 hour and 45 minutes.  COMPLICATIONS:  None.  DESCRIPTION OF PROCEDURE:  The patient was brought to the operating room on 08/30/2018 and placed on the operating table in supine position.  After being placed under general anesthesia, his right knee was examined.  Range of motion was from 0-95  degrees.  Knee was stable with normal patellar tracking.  The right leg was prepped using sterile DuraPrep and draped using sterile technique.  Time-out procedure was called.  The correct right knee identified.  The right leg was exsanguinated and a  tourniquet elevated to 365 mm.  Initially through his previous total knee incision, a new incision was made along the same incision line.  The underlying subcutaneous tissues were incised in line with the skin incision.  A median arthrotomy was performed  revealing an excessive amount of purulent fluid, which was evacuated.  This fluid had been sent yesterday after the knee was aspirated for Gram stain culture and cell count and was found to have 100,000 white cells with gram-positive cocci.  The knee  joint was entered.  The femoral and tibial components were found to be well fixed.  There was significant synovitis in the knee, and this was aggressively and sharply removed, carefully protecting his ligamentous structures.  The patella implant was also  well fixed.  At this point, the  polyethylene spacer was removed, and the knee was further aggressively cleaned and aggressive synovectomy carried out as well.  After this was done, tissue cultures were also obtained.  The knee was then jet lavaged,  irrigated with 3 L of saline, and then 1 L peroxide solution was placed in the knee.  This was thoroughly used to cleanse the surfaces of the knee.  This was then irrigated out.  Then Clorpactin was also used mixed with sterile water, and this was also  used to thoroughly cleanse the metal surfaces of the knee and the soft tissues as well.  After this was done, then a new 12.5 mm polyethylene RP tibial spacer matching the #5 femoral and tibial component was placed back in the knee, and the knee was  reduced, taken through a range of motion from 0-130 degrees.  The knee was stable with normal patellar tracking.  After this was done, there was found to be excellent cleansing noted to the knee, and a very thorough and complete synovectomy had been  carried out.  At this point then the arthrotomy was closed with 0 and #1 running Vicryl suture over 2 medium Hemovac drains.  Subcutaneous tissues closed with 0 and 2-0 Vicryl, subcuticular layer closed with 4-0 Monocryl.  Sterile dressings were applied  and the tourniquet was released.  Of note is that the patient during the surgery had dropped his oxygen saturation into the level of the low 90s.  He was switched from an LMA to being intubated, but there was found to be no gastric contents noted by anesthesia when this was done.  An  intraoperative chest x-ray was  obtained after the knee was closed and the dressing was placed, which showed a left lower lobe atelectasis but no right lobe changes.  Of note is that he was extubated without complications in the operating room and taken  to the recovery room in stable condition.  Needle and sponge counts were correct x2 at the end of the case.  FOLLOWUP CARE:  The patient will be placed in the ICU with  critical care team helping being involved in his care due to his oxygenation issues and low urinary output of 150 mL during the time of the surgery.  He will be followed closely by [place] as well,  and we will proceed with routine postoperative knee rehabilitation and IV antibiotics with infectious disease consultation as well.  LN/NUANCE  D:08/30/2018 T:08/30/2018 JOB:[id number]

## 2018-08-30 NOTE — Anesthesia Procedure Notes (Signed)
Procedure Name: Intubation Date/Time: 08/30/2018 9:16 AM Performed by: Waynard Edwards, CRNA Pre-anesthesia Checklist: Patient identified, Emergency Drugs available, Suction available and Patient being monitored Patient Re-evaluated:Patient Re-evaluated prior to induction Oxygen Delivery Method: Circle system utilized Preoxygenation: Pre-oxygenation with 100% oxygen Induction Type: IV induction Laryngoscope Size: Glidescope and 4 Grade View: Grade I Tube type: Oral Tube size: 7.5 mm Number of attempts: 1 Airway Equipment and Method: Rigid stylet and Video-laryngoscopy Placement Confirmation: ETT inserted through vocal cords under direct vision,  positive ETCO2 and breath sounds checked- equal and bilateral Secured at: 24 cm Tube secured with: Tape Dental Injury: Teeth and Oropharynx as per pre-operative assessment  Difficulty Due To: Difficulty was anticipated and Difficult Airway- due to large tongue

## 2018-08-30 NOTE — Consult Note (Signed)
PULMONARY / CRITICAL CARE MEDICINE   NAME:  JAYVAN MCSHAN, MRN:  [id number], DOB:  01-Jan-1966, LOS: 1 ADMISSION DATE:  08/29/2018, CONSULTATION DATE:  08/30/2018 REFERRING MD:  Shela Commons. CHIEF COMPLAINT:  Hypoxemia post-op for R total knee washout and poly exchange  HISTORY OF PRESENT ILLNESS   This is a 52 y.o W M who is s/p right total knee March, 2018.  Early this week, 3 days prior to admission, while at a conference he states he noticed redness and swelling in his RLE that he initially attributed to scratching insect bites.  One day pta he began having right knee pain and fever, chills and night sweats.  He was seen at the orthopedic urgent care 1 day pta where aspiration of his knee reveal obvious pus.  Cell count of his synovial fluid was greater than 100,000 white cells.  He was therefore admited for OR Incision, irrigation and debridement as well as a poly exchange of his right total knee. Today post-op the patient has persistent hypoxemia with a PCXR being suggestive of a patchy LLL infiltrate. Although able to be extubated, as the patient has a hx of OSA, PCCM was consulted to evaluate and manage the patient's hypoxemia.  SIGNIFICANT PAST MEDICAL HISTORY   OSA with home CPAP  STUDIES:   PCXR 9/15 was formally read as in addition to a widened mediastinum, showed central vascular congestion, mild interstitial edema and a suspected left pleural effusion and bibasilar atelectasis. I  personally reviewed the chest film and agree CULTURES:  Gram stain of R knee showed GPC; culture is pending  ANTIBIOTICS:  Vancomycin 9/14  LINES/TUBES:   Periph IV Foley  SUBJECTIVE:  Only c/o R knee pain; no dyspnea  CONSTITUTIONAL: BP 121/60 (BP Location: Right Arm)   Pulse 84   Temp (!) 96.9 F (36.1 C) (Axillary)   Resp 18   Ht 6\' 2"  (1.88 m)   Wt (!) 145.3 kg   SpO2 92%   BMI 41.13 kg/m   I/O last 3 completed shifts: In: 2841.1 [I.V.:199.8; IV Piggyback:2641.3] Out: 950  [Urine:950]        PHYSICAL EXAM: General:  Obese W M NARD Neuro:  A&Ox3. Moves all 4s spontaneously HEENT:  NA/AT; PERRL, EOMI; CPAP mask in place Cardiovascular:  RRR, no M/R/G Lungs:  Clear to auscultation bilaterally Abdomen:  Protuberant but non-tender; +BS Musculoskeletal:  RLE wrapped; otherwise no active joints Skin:  No C/C/E  RESOLVED PROBLEM LIST   ASSESSMENT AND PLAN   1) Post-op hypoxemia Likely related to development of intra-op atelectasis in a patient with OSA. Hypoxemia corrects with CPAP 10. Will titrate to nasal O2/HiFlow during the day and CPAP tonight. 2) Widened mediastinum on CXR. Would suggest an upright PA and lat CXR after discharge  SUMMARY OF TODAY'S PLAN:  As above  Best Practice / Goals of Care / Disposition.   DVT PROPHYLAXIS: Lovenox SQ NUTRITION: Can have reg diet MOBILITY:Per ortho FAMILY DISCUSSIONS: Discussed with family at bedside   LABS  Glucose Recent Labs  Lab 08/30/18 1152  GLUCAP 173*    BMET Recent Labs  Lab 08/29/18 1217 08/30/18 0319  NA 137 134*  K 4.5 3.5  CL 99 102  CO2 22 23  BUN 10 9  CREATININE 0.96 0.97  GLUCOSE 107* 109*    Liver Enzymes Recent Labs  Lab 08/29/18 1217  AST 23  ALT 32  ALKPHOS 66  BILITOT 1.2  ALBUMIN 3.6    Electrolytes Recent Labs  Lab 08/29/18 1217 08/30/18 0319  CALCIUM 9.0 8.2*    CBC Recent Labs  Lab 08/29/18 1217 08/30/18 0319  WBC 12.7* 11.4*  HGB 15.0 13.0  HCT 44.8 38.5*  PLT 190 180    ABG No results for input(s): PHART, PCO2ART, PO2ART in the last 168 hours.  Coag's No results for input(s): APTT, INR in the last 168 hours.  Sepsis Markers No results for input(s): LATICACIDVEN, PROCALCITON, O2SATVEN in the last 168 hours.  Cardiac Enzymes No results for input(s): TROPONINI, PROBNP in the last 168 hours.  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  PAST SURGICAL HISTORY:  He  has a past surgical history that includes Total knee arthroplasty (Right, 02/26/2017); Joint replacement; Total knee arthroplasty (Left, 04/07/2017); Meniscus repair (Right, 2017); Total knee arthroplasty (Left, 04/07/2017); and No past surgeries.  Allergies  Allergen Reactions  . Lipitor [Atorvastatin Calcium] Other (See Comments)    Muscle aches  . Penicillins Hives and Swelling    PATIENT HAS HAD KEFLEX WITH NO REACTION Swells "all over" Has patient had a PCN reaction causing immediate rash, facial/tongue/throat swelling, SOB or lightheadedness with hypotension: Yes Has patient had a PCN reaction causing severe rash involving mucus membranes or skin necrosis: Unk Has patient had a PCN reaction that required hospitalization: No, but went to Urgent Care Has patient had a PCN reaction occurring within the last 10 years: No If all of the above answers are "NO", then may proceed    No current facility-administered medications on file prior to encounter.    Current Outpatient Medications on File Prior to Encounter  Medication Sig  . acetaminophen (TYLENOL) 500 MG tablet Take 500-1,000 mg by mouth every 6 (six) hours as needed (for pain.).  Marland Kitchen cetirizine (ZYRTEC) 10 MG tablet Take 10 mg by mouth See admin instructions. Take 10 mg by mouth once a day when not taking Claritin  . clobetasol ointment (TEMOVATE) 0.05 % Apply 1 application topically daily as needed (to affected areas for psoriasis).   Marland Kitchen diclofenac (VOLTAREN) 75 MG EC tablet Take 75 mg by mouth 2 (two) times daily as needed (for inflammation).   Marland Kitchen docusate sodium (COLACE) 100 MG capsule 1 tab 2  times a day while on narcotics.  STOOL SOFTENER (Patient taking differently: Take 100 mg by mouth 2 (two) times daily as needed (only when taking narcotic pain meds). )  . Emollient (MOISTURIZING LOTION EX) Apply 1 application topically 3 (three) times daily as needed (for dry skin- O'Keeffe's Skin Repair).   Marland Kitchen levothyroxine (SYNTHROID, LEVOTHROID) 300 MCG tablet Take 300 mcg by mouth daily before breakfast.   . lisinopril (PRINIVIL,ZESTRIL) 20 MG tablet Take 20 mg by mouth daily.   Marland Kitchen loratadine (CLARITIN) 10 MG tablet Take 10 mg by mouth daily.  . naproxen sodium (ALEVE) 220 MG tablet Take 200-440 mg by mouth 2 (two) times daily as needed (for pain).  . nebivolol (BYSTOLIC) 10 MG tablet Take 10 mg by mouth daily.  . ranitidine (ZANTAC) 75 MG tablet Take 75-150 mg by mouth See admin instructions. Take 75 mg by mouth in the morning then 75 mg in the afternoon then 150 mg at bedtime  . rosuvastatin (CRESTOR) 20  MG tablet Take 20 mg by mouth at bedtime.   . tamsulosin (FLOMAX) 0.4 MG CAPS capsule Take 0.4 mg by mouth at bedtime.  Marland Kitchen aspirin EC 325 MG tablet Take 1 tablet (325 mg total) by mouth daily. 1 tab a day for the next 30 days to prevent blood clots (Patient not taking: Reported on 08/29/2018)  . oxyCODONE (ROXICODONE) 5 MG immediate release tablet Take 1-2 tabs po q 4-6 hours prn pain (Patient not taking: Reported on 08/29/2018)  . polyethylene glycol (MIRALAX / GLYCOLAX) packet 17grams in 6 oz of water twice a day until bowel movement.  LAXITIVE.  Restart if two days since last bowel movement (Patient not taking: Reported on 08/29/2018)    FAMILY HISTORY:   His family history includes Atrial fibrillation in his father; Deep vein thrombosis in his mother; Heart attack in his father; Hypothyroidism in his unknown relative; Pulmonary embolism in his unknown relative; Rheum arthritis in his mother.  SOCIAL HISTORY:  He  reports that he has never smoked. He quit smokeless tobacco use about 11 years  ago. He reports that he drinks about 3.0 standard drinks of alcohol per week. He reports that he does not use drugs.  REVIEW OF SYSTEMS:    Gen: + for F, C prior to adm; no anorexia, wt loss HEENT: No visual or auditory disturbances; no pharyngitis, epistaxis Resp: Hx OSA on CPAP; ho hx obstructive lung disease, hemoptysis, chronic cough/sputum; does have DOE that he attributes to poor physical conditioning Cor: No angina, palpitations, OHD; +HTN GI: No PUD, hematemesis, melena, hematechezia GU: No hx renal stones, hematuria, dysuria Neuro: no hx seizures/syncope Endo: No DM; +hypothyroidism - on synthroid M-S: Per HPI Integ: No rashes; otherwise per HPI

## 2018-08-30 NOTE — Transfer of Care (Signed)
Immediate Anesthesia Transfer of Care Note  Patient: Bryan [name]  Procedure(s) Performed: RIGHT TOTAL KNEE WASHOUT AND POLY EXCHANGE (Right Knee)  Patient Location: PACU  Anesthesia Type:General  Level of Consciousness: awake, alert , oriented and patient cooperative  Airway & Oxygen Therapy: Patient Spontanous Breathing and Patient connected to face mask oxygen  Post-op Assessment: Report given to RN, Post -op Vital signs reviewed and stable and Patient moving all extremities X 4  Post vital signs: Reviewed and stable  Last Vitals:  Vitals Value Taken Time  BP 127/68 08/30/2018 10:26 AM  Temp    Pulse 108 08/30/2018 10:34 AM  Resp 22 08/30/2018 10:34 AM  SpO2 100 % 08/30/2018 10:34 AM  Vitals shown include unvalidated device data.  Last Pain:  Vitals:   08/30/18 0508  TempSrc:   PainSc: 0-No pain      Patients Stated Pain Goal: 3 (08/29/18 1231)  Complications: No apparent anesthesia complications

## 2018-08-30 NOTE — Progress Notes (Signed)
RT NOTES: Called to room by RN post surgery. Patient on 5lpm nasal cannula and sats are 87%. Pt is resting comfortably but taking shallow breaths. HFNC of 9lpm placed on patient with sats only increasing to 90%. Placed patient on cpap @ 10cmH2O with 10L O2 bleed in. Sats staying between 90-92%. RN notified CCM MD. Waiting on further instructions.

## 2018-08-30 NOTE — Progress Notes (Signed)
MD paged r/t patient's pain and request for additional medication.

## 2018-08-30 NOTE — Progress Notes (Signed)
Orthopedic Tech Progress Note Patient Details:  Bryan [name] Sep 17, 1966 [id number]  CPM Right Knee CPM Right Knee: On Right Knee Flexion (Degrees): 60 Right Knee Extension (Degrees): 0  Post Interventions Patient Tolerated: Well Instructions Provided: Care of device  Nikki Dom 08/30/2018, 12:41 PM Pt able to tolerate cpm @0 -60 degrees @1240 ; will increase as pt tolerates; RN notified; pt's current bed will not accept trapeze bar patient helper; RN notified

## 2018-08-30 NOTE — Anesthesia Procedure Notes (Signed)
Procedure Name: LMA Insertion Date/Time: 08/30/2018 7:35 AM Performed by: Waynard Edwards, CRNA Pre-anesthesia Checklist: Patient identified, Emergency Drugs available, Suction available and Patient being monitored Patient Re-evaluated:Patient Re-evaluated prior to induction Oxygen Delivery Method: Circle system utilized Preoxygenation: Pre-oxygenation with 100% oxygen Induction Type: IV induction Ventilation: Mask ventilation without difficulty LMA: LMA inserted LMA Size: 5.0 Number of attempts: 1 Placement Confirmation: positive ETCO2 and breath sounds checked- equal and bilateral Tube secured with: Tape Dental Injury: Teeth and Oropharynx as per pre-operative assessment

## 2018-08-30 NOTE — Interval H&P Note (Signed)
History and Physical Interval Note:  08/30/2018 7:05 AM  Bryan [name]  has presented today for surgery, with the diagnosis of KNEE  The various methods of treatment have been discussed with the patient and family. After consideration of risks, benefits and other options for treatment, the patient has consented to  Procedure(s): RIGHT TOTAL KNEE WASHOUT AND POLY EXCHANGE (Right) as a surgical intervention .  The patient's history has been reviewed, patient examined, no change in status, stable for surgery.  I have reviewed the patient's chart and labs.  Questions were answered to the patient's satisfaction.     [name]

## 2018-08-30 NOTE — Anesthesia Preprocedure Evaluation (Addendum)
Anesthesia Evaluation  Patient identified by MRN, date of birth, ID band Patient awake    Reviewed: Allergy & Precautions, NPO status , Patient's Chart, lab work & pertinent test results  Airway Mallampati: II  TM Distance: <3 FB     Dental  (+) Dental Advisory Given   Pulmonary sleep apnea ,    breath sounds clear to auscultation       Cardiovascular hypertension, Pt. on medications and Pt. on home beta blockers  Rhythm:Regular Rate:Normal     Neuro/Psych  Neuromuscular disease    GI/Hepatic Neg liver ROS, GERD  ,  Endo/Other  Hypothyroidism   Renal/GU negative Renal ROS     Musculoskeletal  (+) Arthritis ,   Abdominal   Peds  Hematology negative hematology ROS (+)   Anesthesia Other Findings   Reproductive/Obstetrics                             Lab Results  Component Value Date   WBC 11.4 (H) 08/30/2018   HGB 13.0 08/30/2018   HCT 38.5 (L) 08/30/2018   MCV 92.5 08/30/2018   PLT 180 08/30/2018   Lab Results  Component Value Date   CREATININE 0.97 08/30/2018   BUN 9 08/30/2018   NA 134 (L) 08/30/2018   K 3.5 08/30/2018   CL 102 08/30/2018   CO2 23 08/30/2018    Anesthesia Physical  Anesthesia Plan  ASA: III  Anesthesia Plan: General   Post-op Pain Management:    Induction: Intravenous  PONV Risk Score and Plan: 2 and Ondansetron, Dexamethasone and Treatment may vary due to age or medical condition  Airway Management Planned: LMA and Oral ETT  Additional Equipment:   Intra-op Plan:   Post-operative Plan: Extubation in OR  Informed Consent: I have reviewed the patients History and Physical, chart, labs and discussed the procedure including the risks, benefits and alternatives for the proposed anesthesia with the patient or authorized representative who has indicated his/her understanding and acceptance.   Dental advisory given  Plan Discussed with:  Anesthesiologist and CRNA  Anesthesia Plan Comments:        Anesthesia Quick Evaluation

## 2018-08-30 NOTE — Anesthesia Postprocedure Evaluation (Signed)
Anesthesia Post Note  Patient: SILVIO HELSLEY  Procedure(s) Performed: RIGHT TOTAL KNEE WASHOUT AND POLY EXCHANGE (Right Knee)     Patient location during evaluation: PACU Anesthesia Type: General Level of consciousness: awake and alert Pain management: pain level controlled Vital Signs Assessment: post-procedure vital signs reviewed and stable Respiratory status: spontaneous breathing, nonlabored ventilation, respiratory function stable and patient connected to nasal cannula oxygen Cardiovascular status: blood pressure returned to baseline and stable Postop Assessment: no apparent nausea or vomiting Anesthetic complications: no    Last Vitals:  Vitals:   08/30/18 1056 08/30/18 1104  BP: 116/83 (!) 116/55  Pulse: (!) 104 (!) 102  Resp: (!) 25 (!) 25  Temp:  36.7 C  SpO2: 95% 97%    Last Pain:  Vitals:   08/30/18 0508  TempSrc:   PainSc: 0-No pain                 Kennieth Rad

## 2018-08-30 NOTE — Progress Notes (Signed)
Patient educated on the importance of ambulation/mobility safety. RN and NT got patient out of bed per patient's request to get on the bedside commode for bowel movement. Patient had unsteady balance on pivot and increased pain. Patient was unsuccessful with bowel movement attempt. Later patient called back and requested to get up and back on the bedside commode to try bowel movement attempt again. RN reminded the patient of the importance of ambulation/mobility safety. Patient's RN, an additional RN, and NT placed the patient on the bedpan upon patient's agreement of the recommended plan of care. Nursing will continue to monitor.

## 2018-08-31 ENCOUNTER — Encounter (HOSPITAL_COMMUNITY): Payer: Self-pay | Admitting: *Deleted

## 2018-08-31 DIAGNOSIS — Z880 Allergy status to penicillin: Secondary | ICD-10-CM

## 2018-08-31 DIAGNOSIS — Z888 Allergy status to other drugs, medicaments and biological substances status: Secondary | ICD-10-CM

## 2018-08-31 DIAGNOSIS — Z96653 Presence of artificial knee joint, bilateral: Secondary | ICD-10-CM

## 2018-08-31 DIAGNOSIS — I10 Essential (primary) hypertension: Secondary | ICD-10-CM

## 2018-08-31 DIAGNOSIS — M00061 Staphylococcal arthritis, right knee: Secondary | ICD-10-CM

## 2018-08-31 DIAGNOSIS — T8453XA Infection and inflammatory reaction due to internal right knee prosthesis, initial encounter: Principal | ICD-10-CM

## 2018-08-31 DIAGNOSIS — B9561 Methicillin susceptible Staphylococcus aureus infection as the cause of diseases classified elsewhere: Secondary | ICD-10-CM

## 2018-08-31 DIAGNOSIS — G4730 Sleep apnea, unspecified: Secondary | ICD-10-CM

## 2018-08-31 LAB — BASIC METABOLIC PANEL
ANION GAP: 10 (ref 5–15)
BUN: 10 mg/dL (ref 6–20)
CALCIUM: 8.4 mg/dL — AB (ref 8.9–10.3)
CO2: 26 mmol/L (ref 22–32)
CREATININE: 0.89 mg/dL (ref 0.61–1.24)
Chloride: 102 mmol/L (ref 98–111)
GFR calc Af Amer: >60 mL/min (ref >60)
Glucose, Bld: 181 mg/dL — ABNORMAL HIGH (ref 70–99)
Potassium: 4.4 mmol/L (ref 3.5–5.1)
Sodium: 138 mmol/L (ref 135–145)

## 2018-08-31 LAB — BODY FLUID CULTURE: SPECIAL REQUESTS: NORMAL

## 2018-08-31 LAB — CBC
HCT: 39.2 % (ref 39.0–52.0)
HEMOGLOBIN: 13 g/dL (ref 13.0–17.0)
MCH: 31.3 pg (ref 26.0–34.0)
MCHC: 33.2 g/dL (ref 30.0–36.0)
MCV: 94.5 fL (ref 78.0–100.0)
Platelets: 196 10*3/uL (ref 150–400)
RBC: 4.15 MIL/uL — ABNORMAL LOW (ref 4.22–5.81)
RDW: 12.6 % (ref 11.5–15.5)
WBC: 12.6 10*3/uL — ABNORMAL HIGH (ref 4.0–10.5)

## 2018-08-31 LAB — GLUCOSE, CAPILLARY
GLUCOSE-CAPILLARY: 142 mg/dL — AB (ref 70–99)
GLUCOSE-CAPILLARY: 151 mg/dL — AB (ref 70–99)
GLUCOSE-CAPILLARY: 184 mg/dL — AB (ref 70–99)
GLUCOSE-CAPILLARY: 209 mg/dL — AB (ref 70–99)
Glucose-Capillary: 126 mg/dL — ABNORMAL HIGH (ref 70–99)
Glucose-Capillary: 145 mg/dL — ABNORMAL HIGH (ref 70–99)
Glucose-Capillary: 252 mg/dL — ABNORMAL HIGH (ref 70–99)

## 2018-08-31 LAB — C-REACTIVE PROTEIN: CRP: 34.7 mg/dL — ABNORMAL HIGH (ref <1.0)

## 2018-08-31 MED ORDER — SODIUM CHLORIDE 0.9% FLUSH
10.0000 mL | INTRAVENOUS | Status: DC | PRN
  Administered 2018-09-02: 20 mL
  Filled 2018-08-31: qty 40

## 2018-08-31 MED ORDER — CEFAZOLIN SODIUM-DEXTROSE 2-4 GM/100ML-% IV SOLN
2.0000 g | Freq: Three times a day (TID) | INTRAVENOUS | Status: DC
  Administered 2018-08-31 – 2018-09-02 (×7): 2 g via INTRAVENOUS
  Filled 2018-08-31 (×9): qty 100

## 2018-08-31 MED ORDER — SODIUM CHLORIDE 0.9% FLUSH
10.0000 mL | Freq: Two times a day (BID) | INTRAVENOUS | Status: DC
  Administered 2018-08-31 – 2018-09-02 (×2): 10 mL

## 2018-08-31 NOTE — Progress Notes (Signed)
Patients home CPAP set up for him and sterile water added. Patient states he can put himself on/off. Aware to have RN to call if he changes his mind.

## 2018-08-31 NOTE — Progress Notes (Signed)
Pharmacy Antibiotic Note  Bryan [name] is a 52 y.o. male admitted on 08/29/2018 with infected R-knee.  Pharmacy has been consulted for cefazolin dosing.  Underwent knee washout for infected R knee prosthesis on 9/15  Plan: Discontinue vancomycin Cefazolin 2 g q8h x 6 weeks Monitor renal function and clinical status.   Height: 6\' 2"  (188 cm) Weight: (!) 320 lb 5.3 oz (145.3 kg) IBW/kg (Calculated) : 82.2  Temp (24hrs), Avg:97.6 F (36.4 C), Min:96.9 F (36.1 C), Max:97.9 F (36.6 C)  Recent Labs  Lab 08/29/18 1217 08/30/18 0319 08/31/18 0349  WBC 12.7* 11.4* 12.6*  CREATININE 0.96 0.97 0.89    Estimated Creatinine Clearance: 149.2 mL/min (by C-G formula based on SCr of 0.89 mg/dL).    Allergies  Allergen Reactions  . Lipitor [Atorvastatin Calcium] Other (See Comments)    Muscle aches  . Penicillins Hives and Swelling    PATIENT HAS HAD KEFLEX WITH NO REACTION Swells "all over" Has patient had a PCN reaction causing immediate rash, facial/tongue/throat swelling, SOB or lightheadedness with hypotension: Yes Has patient had a PCN reaction causing severe rash involving mucus membranes or skin necrosis: Unk Has patient had a PCN reaction that required hospitalization: No, but went to Urgent Care Has patient had a PCN reaction occurring within the last 10 years: No If all of the above answers are "NO", then may proceed    Antimicrobials this admission: Vancomycin 9/14 >> 9/16 Cefuroxime x 1 9/15 Cefazolin 9/14 >>   Microbiology results: 9/14 UCx:   9/14 BCx: ngtd 9/15 synovial fluid: MSSA 9/15 wound culture: rare staph aureus - susceptibilities pending 9/15 MRSA PCR: negative   Thank you for allowing pharmacy to be a part of this patient's care.  Danae Orleans, PharmD PGY1 Pharmacy Resident Phone [phone] 08/31/2018       11:45 AM

## 2018-08-31 NOTE — Evaluation (Signed)
Physical Therapy Evaluation Patient Details Name: Bryan [name] MRN: [id number] DOB: [birth year] Today's Date: 08/31/2018   History of Present Illness  52 y.o W M who is s/p right total knee March, 2018..Began having right knee pain and fever, chills and night sweats went to orthopedic urgent care where aspiration of his knee reveal obvious pus.  Cell count of his synovial fluid was greater than 100,000 white cells. Admited for OR Incision, irrigation and debridement as well as a poly exchange of his right total knee.patient has persistent hypoxemia with a PCXR being suggestive of a patchy LLL infiltrate. Although able to be extubated, as the patient has a hx of OSA, transferred to ICU.   Clinical Impression  PTA pt working, driving and independent in all iADLs. Pt is currently limited in safe mobility by O2 desaturation (see General Comments) as well as decreased strength and ROM. Pt requires min A for bed mobility, min guard for sit>stand and minA for ambulation of 20 feet with RW. PT recommends HHPT level rehab at d/c to improve strength and mobility. PT will continue to follow acutely.    Follow Up Recommendations Home health PT;Supervision for mobility/OOB    Equipment Recommendations  None recommended by PT    Recommendations for Other Services       Precautions / Restrictions Precautions Precautions: None Restrictions Weight Bearing Restrictions: Yes RLE Weight Bearing: Weight bearing as tolerated      Mobility  Bed Mobility Overal bed mobility: Needs Assistance Bed Mobility: Supine to Sit     Supine to sit: Min assist     General bed mobility comments: min A for management of L LE off of bed, pt with c/o dizziness initial BP in supine 144/67 with seated BP 142/70. Pt reports he thinks it is due to the medication  Transfers Overall transfer level: Needs assistance Equipment used: Rolling walker (2 wheeled) Transfers: Sit to/from Stand Sit to Stand: Min guard;From  elevated surface         General transfer comment: min guard for powerup and steadying at [employer]  Ambulation/Gait Ambulation/Gait assistance: Min assist Gait Distance (Feet): 20 Feet Assistive device: Rolling walker (2 wheeled) Gait Pattern/deviations: Decreased step length - right;Decreased stance time - left;Decreased weight shift to left;Step-to pattern;Trunk flexed Gait velocity: slowed Gait velocity interpretation: <1.31 ft/sec, indicative of household ambulator General Gait Details: minA for steadying with RW, decreased weightshift to L, vc for L heel to floor        Balance Overall balance assessment: Needs assistance Sitting-balance support: Feet supported;No upper extremity supported Sitting balance-Leahy Scale: Good     Standing balance support: During functional activity;Single extremity supported Standing balance-Leahy Scale: Fair                               Pertinent Vitals/Pain Pain Assessment: 0-10 Pain Score: 5  Pain Location: L knee with movement Pain Descriptors / Indicators: Aching;Throbbing Pain Intervention(s): Limited activity within patient's tolerance;Monitored during session;Repositioned    Home Living Family/patient expects to be discharged to:: Private residence Living Arrangements: Spouse/significant other Available Help at Discharge: Family;Available PRN/intermittently Type of Home: House Home Access: Stairs to enter Entrance Stairs-Rails: Left Entrance Stairs-Number of Steps: 3 Home Layout: Able to live on main level with bedroom/bathroom;Two level Home Equipment: Walker - 2 wheels;Shower seat      Prior Function Level of Independence: Independent         Comments: works as Art gallery manager for  [employer], independent with all aspects of mobility and activities of daily living        Extremity/Trunk Assessment   Upper Extremity Assessment Upper Extremity Assessment: Overall WFL for tasks assessed    Lower Extremity  Assessment Lower Extremity Assessment: LLE deficits/detail LLE Deficits / Details: s/p L TKA revision and washout, knee lacking full ext/flex  LLE: Unable to fully assess due to pain    Cervical / Trunk Assessment Cervical / Trunk Assessment: Normal  Communication   Communication: No difficulties  Cognition Arousal/Alertness: Awake/alert Behavior During Therapy: WFL for tasks assessed/performed Overall Cognitive Status: Within Functional Limits for tasks assessed                                        General Comments General comments (skin integrity, edema, etc.): Pt on 3L with SaO2 100%O2 on entry, supplemental O2 removed for ambulation, with ambulation SaO2 dropped back to 83%O2, poor wave form and heavy use of hand with O2 sensor, with static standing SaO2 rebounded to 87%O2, supplemental O2 of 3L returned to pt and able to ambulate back to chair with SaO2 95%O2.      Assessment/Plan    PT Assessment Patient needs continued PT services  PT Problem List Decreased range of motion;Decreased strength;Decreased activity tolerance;Decreased balance;Decreased mobility;Cardiopulmonary status limiting activity;Pain       PT Treatment Interventions DME instruction;Gait training;Stair training;Functional mobility training;Therapeutic activities;Therapeutic exercise;Balance training;Cognitive remediation;Patient/family education    PT Goals (Current goals can be found in the Care Plan section)  Acute Rehab PT Goals Patient Stated Goal: go home PT Goal Formulation: With patient Time For Goal Achievement: 09/14/18 Potential to Achieve Goals: Fair    Frequency 7X/week    AM-PAC PT "6 Clicks" Daily Activity  Outcome Measure Difficulty turning over in bed (including adjusting bedclothes, sheets and blankets)?: Unable Difficulty moving from lying on back to sitting on the side of the bed? : Unable Difficulty sitting down on and standing up from a chair with arms (e.g.,  wheelchair, bedside commode, etc,.)?: Unable Help needed moving to and from a bed to chair (including a wheelchair)?: A Little Help needed walking in hospital room?: A Little Help needed climbing 3-5 steps with a railing? : A Lot 6 Click Score: 11    End of Session Equipment Utilized During Treatment: Gait belt;Oxygen Activity Tolerance: Patient tolerated treatment well Patient left: in chair;with call bell/phone within reach Nurse Communication: Mobility status PT Visit Diagnosis: Unsteadiness on feet (R26.81);Other abnormalities of gait and mobility (R26.89);Muscle weakness (generalized) (M62.81);Difficulty in walking, not elsewhere classified (R26.2);Pain Pain - Right/Left: Left Pain - part of body: Knee    Time: 1610-9604 PT Time Calculation (min) (ACUTE ONLY): 48 min   Charges:   PT Evaluation $PT Eval Moderate Complexity: 1 Mod PT Treatments $Gait Training: 8-22 mins $Therapeutic Activity: 8-22 mins        Bonnie Roig B. Beverely Risen PT, DPT Acute Rehabilitation Services Pager [phone] Office [phone]   Elon Alas Fleet 08/31/2018, 2:01 PM

## 2018-08-31 NOTE — Care Management Note (Signed)
Case Management Note  Patient Details  Name: Bryan [name] MRN: [id number] Date of Birth: 1966-08-09  Subjective/Objective:   Pt admitted with  Infection of right knee replacement.  Pt is now s/p right total knee washout and poly exchange                 Action/Plan:  PTA independent from home, pt has PCP.  Pt will discharge home on IV antibiotics - ortho prefers to use [facility] for IV infusion.  CM offered choice to pt and pt chose [facility] for both equipment and HH.  Tentative referral made to [facility] - awaiting orders - orders requested.    Expected Discharge Date:  08/31/18               Expected Discharge Plan:  Home w Home Health Services  In-House Referral:     Discharge planning Services  CM Consult  Post Acute Care Choice:    Choice offered to:  Patient  DME Arranged:  IV pump/equipment DME Agency:  [facility]  HH Arranged:  RN [facility] Agency:  [facility]  Status of Service:  In process, will continue to follow  If discussed at Long Length of Stay Meetings, dates discussed:    Additional Comments:  Cherylann Parr, RN 08/31/2018, 4:34 PM

## 2018-08-31 NOTE — Plan of Care (Signed)
  Problem: Education: Goal: Knowledge of General Education information will improve Description Including pain rating scale, medication(s)/side effects and non-pharmacologic comfort measures Outcome: Progressing   Problem: Health Behavior/Discharge Planning: Goal: Ability to manage health-related needs will improve Outcome: Progressing   Problem: Clinical Measurements: Goal: Ability to maintain clinical measurements within normal limits will improve Outcome: Progressing Goal: Will remain free from infection Outcome: Progressing Goal: Diagnostic test results will improve Outcome: Progressing Goal: Respiratory complications will improve Outcome: Progressing Goal: Cardiovascular complication will be avoided Outcome: Progressing   Problem: Activity: Goal: Risk for activity intolerance will decrease Outcome: Progressing   Problem: Nutrition: Goal: Adequate nutrition will be maintained Outcome: Progressing   Problem: Coping: Goal: Level of anxiety will decrease Outcome: Progressing   Problem: Coping: Goal: Level of anxiety will decrease Outcome: Progressing   Problem: Elimination: Goal: Will not experience complications related to bowel motility Outcome: Progressing Goal: Will not experience complications related to urinary retention Outcome: Progressing   Problem: Safety: Goal: Ability to remain free from injury will improve Outcome: Progressing   Problem: Activity: Goal: Ability to avoid complications of mobility impairment will improve Outcome: Progressing Goal: Ability to tolerate increased activity will improve Outcome: Progressing   Problem: Skin Integrity: Goal: Risk for impaired skin integrity will decrease Outcome: Progressing   Problem: Coping: Goal: Level of anxiety will decrease Outcome: Progressing   Problem: Education: Goal: Verbalization of understanding the information provided will improve Outcome: Progressing   Problem: Physical  Regulation: Goal: Postoperative complications will be avoided or minimized Outcome: Progressing   Problem: Respiratory: Goal: Ability to maintain a clear airway will improve Outcome: Progressing   Problem: Pain Management: Goal: Pain level will decrease Outcome: Progressing   Problem: Skin Integrity: Goal: Signs of wound healing will improve Outcome: Progressing   Problem: Tissue Perfusion: Goal: Ability to maintain adequate tissue perfusion will improve Outcome: Progressing

## 2018-08-31 NOTE — Progress Notes (Signed)
[facility]  [facility] will follow pt with ID Team to support home infusion pharmacy services for home IV ABX at DC.  [facility] will work with patient's [facility] of choice. [facility] is prepared for DC when deemed appropriate.  If patient discharges after hours, please call [phone].   Sedalia Muta 08/31/2018, 11:03 AM

## 2018-08-31 NOTE — Plan of Care (Signed)
  Problem: Activity: Goal: Risk for activity intolerance will decrease Outcome: Progressing   Problem: Elimination: Goal: Will not experience complications related to bowel motility Outcome: Progressing   Problem: Safety: Goal: Ability to remain free from injury will improve Outcome: Progressing   Problem: Skin Integrity: Goal: Risk for impaired skin integrity will decrease Outcome: Progressing   

## 2018-08-31 NOTE — Progress Notes (Signed)
PHARMACY CONSULT NOTE FOR:  OUTPATIENT  PARENTERAL ANTIBIOTIC THERAPY (OPAT)  Indication: Prosthetic joint infection Regimen: Cefazolin 2g every 8 hours  End date: 10/10/18  IV antibiotic discharge orders are pended. To discharging provider:  please sign these orders via discharge navigator,  Select New Orders & click on the button choice - Manage This Unsigned Work.     Thank you for allowing pharmacy to be a part of this patient's care.  Gwynneth Albright, Ilda Basset D PGY1 Pharmacy Resident  Phone [phone] 08/31/2018   1:54 PM

## 2018-08-31 NOTE — Consult Note (Signed)
PULMONARY / CRITICAL CARE MEDICINE   NAME:  Bryan [name], MRN:  [id number], DOB:  1966-09-26, LOS: 2 ADMISSION DATE:  08/29/2018, CONSULTATION DATE:  08/30/2018 REFERRING MD:  Shela Commons. CHIEF COMPLAINT:  Hypoxemia post-op for R total knee washout and poly exchange  HISTORY OF PRESENT ILLNESS   This is a 52 y.o W M who is s/p right total knee March, 2018.  presented with pain in the knee with constitutional symptoms.  Underwent knee washout for infected R knee prosthesis on se the patient has persistent hypoxemia with a PCXR being suggestive of a patchy LLL infiltrate. Although able to be extubated, as the patient has a hx of OSA, PCCM was consulted to evaluate and manage the patient's hypoxemia.  SIGNIFICANT PAST MEDICAL HISTORY   OSA with home CPAP   SUBJECTIVE:  Only c/o R knee pain; no dyspoena, but desaturation with ambulation.  CONSTITUTIONAL: BP 111/77   Pulse 72   Temp (!) 97.5 F (36.4 C) (Oral)   Resp (!) 21   Ht 6\' 2"  (1.88 m)   Wt (!) 145.3 kg   SpO2 92%   BMI 41.13 kg/m   I/O last 3 completed shifts: In: 7957.9 [I.V.:1699.8; Other:300; IV Piggyback:5958.1] Out: 6605 [Urine:6075; Drains:480; Blood:50]  Now saturating 97% on 4Lpm.   PHYSICAL EXAM: General:  Obese W M NARD Neuro:  A&Ox3. Moves all 4s spontaneously HEENT:  NA/AT; PERRL, EOMI Cardiovascular:  RRR, no M/R/G Lungs:  Clear to auscultation bilaterally Abdomen:  Protuberant but non-tender; +BS Musculoskeletal:  RLE wrapped; otherwise no active joints Skin:  No C/C/E  RESOLVED PROBLEM LIST   ASSESSMENT AND PLAN    Post-op hypoxemia Likely related to development of intra-op atelectasis in a patient with OSA. Hypoxemia corrects with CPAP 10. Will titrate to nasal O2/HiFlow during the day and CPAP tonight. Incentive spirometry. Early ambulation. Continue nocturnal CPAC Will continue to follow but doubt patient will require home oxygen.  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 MSSA  on cultures  Taper antibiotics to cefazolin and plan to complete 6 weeks of therapy.  PICC line to be placed today. Follow CRP.  SUMMARY OF TODAY'S PLAN:   Transfer to floor under orthopedics with ID and Pulmonary following.  Best Practice / Goals of Care / Disposition.   DVT PROPHYLAXIS: Lovenox SQ NUTRITION: Can have reg diet MOBILITY:Per ortho FAMILY DISCUSSIONS: Patient updated.  LABS   Relevant laboratory and other diagnostic studies were personally reviewed.  Mild stable leukocytosis at 12.6.  Joint culture form 9/14 positive for  MSSA.  Blood cultures are negative. MRSA PCR is negative.    Lynnell Catalan, MD [facility] ICU Physician [facility]  Pager: [phone] Mobile: [phone] After hours: [phone].

## 2018-08-31 NOTE — Progress Notes (Signed)
Subjective: 1 Day Post-Op Procedure(s) (LRB): RIGHT TOTAL KNEE WASHOUT AND POLY EXCHANGE (Right) Patient reports pain as 4 on 0-10 scale.    Objective: Vital signs in last 24 hours: Temp:  [97.5 F (36.4 C)-97.9 F (36.6 C)] 97.9 F (36.6 C) (09/16 1100) Pulse Rate:  [55-91] 72 (09/16 0900) Resp:  [11-25] 21 (09/16 0900) BP: (111-167)/(52-79) 111/77 (09/16 0900) SpO2:  [90 %-98 %] 92 % (09/16 0900)  Intake/Output from previous day: 09/15 0701 - 09/16 0700 In: 5616.8 [I.V.:1500; IV Piggyback:3816.8] Out: 5655 [Urine:5125; Drains:480; Blood:50] Intake/Output this shift: No intake/output data recorded.  Recent Labs    08/29/18 1217 08/30/18 0319 08/31/18 0349  HGB 15.0 13.0 13.0   Recent Labs    08/30/18 0319 08/31/18 0349  WBC 11.4* 12.6*  RBC 4.16* 4.15*  HCT 38.5* 39.2  PLT 180 196   Recent Labs    08/30/18 0319 08/31/18 0349  NA 134* 138  K 3.5 4.4  CL 102 102  CO2 23 26  BUN 9 10  CREATININE 0.97 0.89  GLUCOSE 109* 181*  CALCIUM 8.2* 8.4*   No results for input(s): LABPT, INR in the last 72 hours.  ABD soft Neurovascular intact Sensation intact distally Intact pulses distally Dorsiflexion/Plantar flexion intact Incision: dressing C/D/I  Anticipated LOS equal to or greater than 2 midnights due to - Age 51 and older with one or more of the following:  - Obesity  - Expected need for hospital services (PT, OT, Nursing) required for safe  discharge  - Anticipated need for postoperative skilled nursing care or inpatient rehab  - Active co-morbidities: None OR   - Unanticipated findings during/Post Surgery: Slow post-op progression: GI, pain control, mobility  Patient had post op hypoxia and required BiPAP at 10 L .  Today he is improved and stable  Assessment/Plan: 1 Day Post-Op Procedure(s) (LRB): RIGHT TOTAL KNEE WASHOUT AND POLY EXCHANGE (Right)  Active Problems:   Infection of total right knee replacement (HCC)   Hypoxemia  Advance  diet Up with therapy Transfer to 5N when bed available.  Patient still draining from hemovac so will not remove today.  Knee synovial fluid grew MSSA.  Waiting to see what the gram negative rods on the deep tissue culture show.     Bryan [name] J Bryan [name] 08/31/2018, 12:44 PM

## 2018-08-31 NOTE — Consult Note (Signed)
[facility]    Date of Admission:  08/29/2018     Total days of antibiotics                Reason for Consult: Prosthetic Knee Joint Infection    Referring Provider: Thurston [name] Primary Care Provider: Elias Else, MD   Assessment/Plan:  Bryan [name] is a 52 y.o male with previous history of hypertension, sleep apnea and about 1.5 years s/p bilateral knee replacements who has a Staphylococcus aureus prosthetic joint infection. Sensitivities remain pending. A polyethylene exchange was performed. He is currently on vancomycin and cefazolin. Addition of rifampin would be ideal, however due to anticoagulation and levothyroxine interaction it does not appear appropriate at this time. Blood cultures have been without growth to date and his fever curve is significantly improved. He will require prolonged course of 6-8 weeks of antibiotics followed by oral antibiotics for at least 6 months.   1. Discontinue vancomycin 2. continue ancef 3.  PICC line being placed. 4. Watch CRP 5. Will d/i home health.   Active Problems:   Infection of total right knee replacement (HCC)   Hypoxemia   . acetaminophen  1,000 mg Oral Q6H  . apixaban  2.5 mg Oral Q12H  . docusate sodium  100 mg Oral BID  . famotidine  20 mg Oral BID  . folic acid  1 mg Oral Daily  . gabapentin  300 mg Oral QHS  . levothyroxine  300 mcg Oral QAC breakfast  . lisinopril  20 mg Oral Daily  . loratadine  10 mg Oral Daily  . multivitamin with minerals  1 tablet Oral Daily  . nebivolol  10 mg Oral Daily  . polyethylene glycol  17 g Oral Daily  . tamsulosin  0.4 mg Oral QHS     HPI: Bryan [name] is a 52 y.o. male with previous medical history listed below and significant for hypertension, sleep apnea, and status post right knee replacement on 02/26/17 and left knee replacement on 04/07/17. He was admitted to the hospital for elective surgical intervention of right total knee infection.  Per  history, pt was cat fishing when he received multiple bug bites on his R ankle. He itched this and then developed a local cellulitis.  Due to work, he was in [name] , [place] and he began having redness and swelling and on the way home was having fevers, chills, and night sweats. Aspiration of the knee showed pus with a cell count of greater than 100,000. Purulent fluid was obtained during surgery and sent for culture. There was significant synovitis in the knee. Polyethylene exchange was performed with excellent cleansing of the knee.  Bryan [name] was initially febrile last night which has been down trending. He is currently receiving cefazolin and vancomycin. Multiple cultures with gram positive cocci and Staphylococcus aureus. There is one culture with gram stain showing gram negative rods. Blood cultures are without growth to date.    Review of Systems: Review of Systems  Constitutional: Negative for chills, fever and malaise/fatigue.  Respiratory: Negative for cough, sputum production, shortness of breath and wheezing.   Cardiovascular: Negative for chest pain and leg swelling.  Gastrointestinal: Negative for abdominal pain, constipation, diarrhea, nausea and vomiting.  Skin: Negative for rash.     Past Medical History:  Diagnosis Date  . Allergy    takes Claritin daily  . Elevated cholesterol    takes Crestor daily  . Enlarged prostate  takes Flomax nightly  . GERD (gastroesophageal reflux disease)    takes Zantac daily  . Hypertension    takes Lisinopril and Bystolic daily  . Hypothyroidism    takes Synthroid daily  . Medical history non-contributory   . Neuromuscular disorder (HCC)    bells palsy  . OSA (obstructive sleep apnea)    uses CPAP  . Primary localized osteoarthritis of left knee 03/25/2017  . Primary localized osteoarthritis of right knee   . S/P total knee arthroplasty, right 03/25/2017  . Vitamin D deficiency     Social History   Tobacco Use  . Smoking  status: Never Smoker  . Smokeless tobacco: Former Engineer, water Use Topics  . Alcohol use: Yes    Alcohol/week: 3.0 standard drinks    Types: 3 Cans of beer per week    Comment: daily  . Drug use: No    Family History  Problem Relation Age of Onset  . Heart attack Father        stent x 2  . Atrial fibrillation Father   . Deep vein thrombosis Mother   . Rheum arthritis Mother   . Pulmonary embolism Unknown   . Hypothyroidism Unknown     Allergies  Allergen Reactions  . Lipitor [Atorvastatin Calcium] Other (See Comments)    Muscle aches  . Penicillins Hives and Swelling    PATIENT HAS HAD KEFLEX WITH NO REACTION Swells "all over" Has patient had a PCN reaction causing immediate rash, facial/tongue/throat swelling, SOB or lightheadedness with hypotension: Yes Has patient had a PCN reaction causing severe rash involving mucus membranes or skin necrosis: Unk Has patient had a PCN reaction that required hospitalization: No, but went to Urgent Care Has patient had a PCN reaction occurring within the last 10 years: No If all of the above answers are "NO", then may proceed    OBJECTIVE: Blood pressure 120/61, pulse 66, temperature (!) 97.5 F (36.4 C), temperature source Oral, resp. rate 13, height 6\' 2"  (1.88 m), weight (!) 145.3 kg, SpO2 91 %.  Physical Exam  Constitutional: He is oriented to person, place, and time. He appears well-developed and well-nourished. No distress.  Lying in bed with head of bed elevated  Cardiovascular: Normal rate, regular rhythm, normal heart sounds and intact distal pulses. Exam reveals no gallop and no friction rub.  No murmur heard. Pulmonary/Chest: Effort normal and breath sounds normal. No stridor. No respiratory distress. He has no wheezes. He has no rales. He exhibits no tenderness.  Abdominal: Soft. Bowel sounds are normal. He exhibits no distension. There is no tenderness.  Neurological: He is alert and oriented to person, place, and  time.  Skin: Skin is warm and dry.  Psychiatric: He has a normal mood and affect. His behavior is normal. Judgment and thought content normal.    Lab Results Lab Results  Component Value Date   WBC 12.6 (H) 08/31/2018   HGB 13.0 08/31/2018   HCT 39.2 08/31/2018   MCV 94.5 08/31/2018   PLT 196 08/31/2018    Lab Results  Component Value Date   CREATININE 0.89 08/31/2018   BUN 10 08/31/2018   NA 138 08/31/2018   K 4.4 08/31/2018   CL 102 08/31/2018   CO2 26 08/31/2018    Lab Results  Component Value Date   ALT 32 08/29/2018   AST 23 08/29/2018   ALKPHOS 66 08/29/2018   BILITOT 1.2 08/29/2018     Microbiology: Recent Results (from the past 240 hour(s))  Body fluid culture     Status: None (Preliminary result)   Collection Time: 08/29/18 12:10 PM  Result Value Ref Range Status   Specimen Description SYNOVIAL  Final   Special Requests Normal  Final   Gram Stain   Final    ABUNDANT WBC PRESENT, PREDOMINANTLY PMN RARE GRAM POSITIVE COCCI IN CLUSTERS    Culture   Final    FEW STAPHYLOCOCCUS AUREUS SUSCEPTIBILITIES TO FOLLOW Performed at [facility], [address] [place], [place] [address]    Report Status PENDING  Incomplete  Culture, blood (single)     Status: None (Preliminary result)   Collection Time: 08/29/18 12:17 PM  Result Value Ref Range Status   Specimen Description BLOOD LEFT ANTECUBITAL  Final   Special Requests   Final    BOTTLES DRAWN AEROBIC ONLY Blood Culture adequate volume   Culture   Final    NO GROWTH 2 DAYS Performed at [facility], [address], [place], [place] [address]    Report Status PENDING  Incomplete  Culture, blood (routine x 2)     Status: None (Preliminary result)   Collection Time: 08/29/18 12:32 PM  Result Value Ref Range Status   Specimen Description BLOOD RIGHT ANTECUBITAL  Final   Special Requests   Final    BOTTLES DRAWN AEROBIC ONLY Blood Culture adequate volume   Culture   Final    NO GROWTH 2  DAYS Performed at [facility], [address], [place], [place] [address]    Report Status PENDING  Incomplete  MRSA PCR Screening     Status: None   Collection Time: 08/30/18  6:14 AM  Result Value Ref Range Status   MRSA by PCR NEGATIVE NEGATIVE Final    Comment:        The GeneXpert MRSA Assay (FDA approved for NASAL specimens only), is one component of a comprehensive MRSA colonization surveillance program. It is not intended to diagnose MRSA infection nor to guide or monitor treatment for MRSA infections. Performed at [facility], [address] [place], [place] [address]   Aerobic/Anaerobic Culture (surgical/deep wound)     Status: None (Preliminary result)   Collection Time: 08/30/18  9:38 AM  Result Value Ref Range Status   Specimen Description TISSUE  Final   Special Requests NONE  Final   Gram Stain   Final    FEW WBC PRESENT, PREDOMINANTLY PMN FEW GRAM NEGATIVE RODS Performed at [facility], [address], [place], [place] [address]    Culture PENDING  Incomplete   Report Status PENDING  Incomplete     Marcos Eke, NP [facility] [phone] Pager  08/31/2018  9:11 AM

## 2018-08-31 NOTE — Progress Notes (Signed)
Peripherally Inserted Central Catheter/Midline Placement  The IV Nurse has discussed with the patient and/or persons authorized to consent for the patient, the purpose of this procedure and the potential benefits and risks involved with this procedure.  The benefits include less needle sticks, lab draws from the catheter, and the patient may be discharged home with the catheter. Risks include, but not limited to, infection, bleeding, blood clot (thrombus formation), and puncture of an artery; nerve damage and irregular heartbeat and possibility to perform a PICC exchange if needed/ordered by physician.  Alternatives to this procedure were also discussed.  Bard Power PICC patient education guide, fact sheet on infection prevention and patient information card has been provided to patient /or left at bedside.    PICC/Midline Placement Documentation  PICC Single Lumen 08/31/18 PICC Right Basilic 45 cm 0 cm (Active)  Indication for Insertion or Continuance of Line Home intravenous therapies (PICC only) 08/31/2018 10:00 PM  Exposed Catheter (cm) 0 cm 08/31/2018 10:00 PM  Site Assessment Clean;Dry;Intact 08/31/2018 10:00 PM  Line Status Flushed;Saline locked;Blood return noted 08/31/2018 10:00 PM  Dressing Type Transparent 08/31/2018 10:00 PM  Dressing Status Clean;Dry;Intact;Antimicrobial disc in place 08/31/2018 10:00 PM  Dressing Change Due 09/07/18 08/31/2018 10:00 PM       Ethelda Chick 08/31/2018, 10:06 PM

## 2018-09-01 ENCOUNTER — Encounter (HOSPITAL_COMMUNITY): Payer: Self-pay | Admitting: Orthopedic Surgery

## 2018-09-01 DIAGNOSIS — Z9989 Dependence on other enabling machines and devices: Secondary | ICD-10-CM

## 2018-09-01 DIAGNOSIS — A419 Sepsis, unspecified organism: Secondary | ICD-10-CM

## 2018-09-01 DIAGNOSIS — Z96651 Presence of right artificial knee joint: Secondary | ICD-10-CM

## 2018-09-01 DIAGNOSIS — G4733 Obstructive sleep apnea (adult) (pediatric): Secondary | ICD-10-CM

## 2018-09-01 DIAGNOSIS — R609 Edema, unspecified: Secondary | ICD-10-CM

## 2018-09-01 DIAGNOSIS — T8453XD Infection and inflammatory reaction due to internal right knee prosthesis, subsequent encounter: Secondary | ICD-10-CM

## 2018-09-01 DIAGNOSIS — Z95828 Presence of other vascular implants and grafts: Secondary | ICD-10-CM

## 2018-09-01 LAB — BASIC METABOLIC PANEL
Anion gap: 10 (ref 5–15)
BUN: 14 mg/dL (ref 6–20)
CALCIUM: 8.4 mg/dL — AB (ref 8.9–10.3)
CHLORIDE: 102 mmol/L (ref 98–111)
CO2: 27 mmol/L (ref 22–32)
Creatinine, Ser: 0.92 mg/dL (ref 0.61–1.24)
Glucose, Bld: 108 mg/dL — ABNORMAL HIGH (ref 70–99)
POTASSIUM: 3.9 mmol/L (ref 3.5–5.1)
SODIUM: 139 mmol/L (ref 135–145)

## 2018-09-01 LAB — CBC
HEMATOCRIT: 35.9 % — AB (ref 39.0–52.0)
HEMOGLOBIN: 11.8 g/dL — AB (ref 13.0–17.0)
MCH: 31.1 pg (ref 26.0–34.0)
MCHC: 32.9 g/dL (ref 30.0–36.0)
MCV: 94.7 fL (ref 78.0–100.0)
Platelets: 218 10*3/uL (ref 150–400)
RBC: 3.79 MIL/uL — ABNORMAL LOW (ref 4.22–5.81)
RDW: 12.7 % (ref 11.5–15.5)
WBC: 10.5 10*3/uL (ref 4.0–10.5)

## 2018-09-01 LAB — GLUCOSE, CAPILLARY
GLUCOSE-CAPILLARY: 84 mg/dL (ref 70–99)
Glucose-Capillary: 104 mg/dL — ABNORMAL HIGH (ref 70–99)
Glucose-Capillary: 90 mg/dL (ref 70–99)
Glucose-Capillary: 86 mg/dL (ref 70–99)
Glucose-Capillary: 120 mg/dL — ABNORMAL HIGH (ref 70–99)

## 2018-09-01 MED ORDER — GUAIFENESIN ER 600 MG PO TB12
1200.0000 mg | ORAL_TABLET | Freq: Two times a day (BID) | ORAL | Status: DC
  Administered 2018-09-01 – 2018-09-02 (×3): 1200 mg via ORAL
  Filled 2018-09-01 (×4): qty 2

## 2018-09-01 NOTE — Discharge Instructions (Signed)
Information on my medicine - ELIQUIS® (apixaban) ° °This medication education was reviewed with me or my healthcare representative as part of my discharge preparation.  The pharmacist that spoke with me during my hospital stay was:  Blia Totman George, RPH ° °Why was Eliquis® prescribed for you? °Eliquis® was prescribed for you to reduce the risk of blood clots forming after orthopedic surgery.   ° °What do You need to know about Eliquis®? °Take your Eliquis® TWICE DAILY - one tablet in the morning and one tablet in the evening with or without food.  It would be best to take the dose about the same time each day. ° °If you have difficulty swallowing the tablet whole please discuss with your pharmacist how to take the medication safely. ° °Take Eliquis® exactly as prescribed by your doctor and DO NOT stop taking Eliquis® without talking to the doctor who prescribed the medication.  Stopping without other medication to take the place of Eliquis® may increase your risk of developing a clot. ° °After discharge, you should have regular check-up appointments with your healthcare provider that is prescribing your Eliquis®. ° °What do you do if you miss a dose? °If a dose of ELIQUIS® is not taken at the scheduled time, take it as soon as possible on the same day and twice-daily administration should be resumed.  The dose should not be doubled to make up for a missed dose.  Do not take more than one tablet of ELIQUIS at the same time. ° °Important Safety Information °A possible side effect of Eliquis® is bleeding. You should call your healthcare provider right away if you experience any of the following: °? Bleeding from an injury or your nose that does not stop. °? Unusual colored urine (red or dark brown) or unusual colored stools (red or black). °? Unusual bruising for unknown reasons. °? A serious fall or if you hit your head (even if there is no bleeding). ° °Some medicines may interact with Eliquis® and might increase  your risk of bleeding or clotting while on Eliquis®. To help avoid this, consult your healthcare provider or pharmacist prior to using any new prescription or non-prescription medications, including herbals, vitamins, non-steroidal anti-inflammatory drugs (NSAIDs) and supplements. ° °This website has more information on Eliquis® (apixaban): http://www.eliquis.com/eliquis/home °

## 2018-09-01 NOTE — Progress Notes (Signed)
Physical Therapy Treatment Patient Details Name: Bryan [name] MRN: [id number] DOB: [birth year] Today's Date: 09/01/2018    History of Present Illness 52 y.o W M who is s/p right total knee March, 2018..Began having right knee pain and fever, chills and night sweats went to orthopedic urgent care where aspiration of his knee reveal obvious pus.  Cell count of his synovial fluid was greater than 100,000 white cells. Admited for OR Incision, irrigation and debridement as well as a poly exchange of his right total knee.patient has persistent hypoxemia with a PCXR being suggestive of a patchy LLL infiltrate. Although able to be extubated, as the patient has a hx of OSA, transferred to ICU.     PT Comments    Pt dizzy with position changes at beginning of session and BP from 137/83 in sitting to 110/60 in standing, however, as he continued to stand dizziness subsided and did not return as he ambulated. Chair brought behind for safety. Ambulated 250' with RW and min-guard A. Pt performed supine and seated there ex and tolerated well. PT will continue to follow.    Follow Up Recommendations  Home health PT;Supervision for mobility/OOB     Equipment Recommendations  None recommended by PT    Recommendations for Other Services       Precautions / Restrictions Precautions Precautions: None Restrictions Weight Bearing Restrictions: Yes RLE Weight Bearing: Weight bearing as tolerated    Mobility  Bed Mobility Overal bed mobility: Needs Assistance Bed Mobility: Supine to Sit     Supine to sit: Supervision     General bed mobility comments: pt able to lift RLE to get to EOB, uses momentum  Transfers Overall transfer level: Needs assistance Equipment used: Rolling walker (2 wheeled) Transfers: Sit to/from Stand Sit to Stand: Min guard;From elevated surface         General transfer comment: vc's for hand placement  Ambulation/Gait Ambulation/Gait assistance: Min guard Gait  Distance (Feet): 250 Feet Assistive device: Rolling walker (2 wheeled) Gait Pattern/deviations: Step-to pattern;Decreased step length - right Gait velocity: slowed Gait velocity interpretation: 1.31 - 2.62 ft/sec, indicative of limited community ambulator General Gait Details: pt dizziness improved with ambulation but chair brought behind as precaution. Pt avoiding R knee flexion by externally rotating R foot and inverting with swing through. He worked on improving this once it was brought to his attention but he has stiffness R foot from swelling so toe off uncomfortable   Stairs             Wheelchair Mobility    Modified Rankin (Stroke Patients Only)       Balance Overall balance assessment: Needs assistance Sitting-balance support: Feet supported;No upper extremity supported Sitting balance-Leahy Scale: Good     Standing balance support: During functional activity;Single extremity supported Standing balance-Leahy Scale: Fair                              Cognition Arousal/Alertness: Awake/alert Behavior During Therapy: WFL for tasks assessed/performed Overall Cognitive Status: Within Functional Limits for tasks assessed                                        Exercises Total Joint Exercises Ankle Circles/Pumps: AROM;Both;10 reps;Supine Quad Sets: AROM;Both;10 reps;Supine Gluteal Sets: AROM;Both;10 reps;Supine Heel Slides: AROM;Right;10 reps;Supine Straight Leg Raises: AROM;Right;10 reps;Supine Long Arc Quad: AROM;Right;10 reps;Supine  Knee Flexion: AROM;Right;10 reps;Seated Goniometric ROM: 10-65    General Comments        Pertinent Vitals/Pain Pain Assessment: 0-10 Pain Score: 5  Pain Location: R knee with movement Pain Descriptors / Indicators: Aching Pain Intervention(s): Limited activity within patient's tolerance;Monitored during session    Home Living Family/patient expects to be discharged to:: Private  residence Living Arrangements: Spouse/significant other Available Help at Discharge: Family;Available PRN/intermittently Type of Home: House Home Access: Stairs to enter Entrance Stairs-Rails: Left Home Layout: Able to live on main level with bedroom/bathroom;Two level Home Equipment: Walker - 2 wheels;Shower seat      Prior Function Level of Independence: Independent      Comments: works as Art gallery manager for [employer], independent with all aspects of mobility and activities of daily living   PT Goals (current goals can now be found in the care plan section) Acute Rehab PT Goals Patient Stated Goal: go home PT Goal Formulation: With patient Time For Goal Achievement: 09/14/18 Potential to Achieve Goals: Good Progress towards PT goals: Progressing toward goals    Frequency    7X/week      PT Plan Current plan remains appropriate    Co-evaluation              AM-PAC PT "6 Clicks" Daily Activity  Outcome Measure  Difficulty turning over in bed (including adjusting bedclothes, sheets and blankets)?: A Little Difficulty moving from lying on back to sitting on the side of the bed? : A Little Difficulty sitting down on and standing up from a chair with arms (e.g., wheelchair, bedside commode, etc,.)?: A Little Help needed moving to and from a bed to chair (including a wheelchair)?: A Little Help needed walking in hospital room?: A Little Help needed climbing 3-5 steps with a railing? : A Little 6 Click Score: 18    End of Session Equipment Utilized During Treatment: Gait belt Activity Tolerance: Patient tolerated treatment well Patient left: in chair;with call bell/phone within reach;Other (comment)(in zero knee foam) Nurse Communication: Mobility status PT Visit Diagnosis: Unsteadiness on feet (R26.81);Other abnormalities of gait and mobility (R26.89);Muscle weakness (generalized) (M62.81);Difficulty in walking, not elsewhere classified (R26.2);Pain Pain - Right/Left:  Right Pain - part of body: Knee     Time: 6387-5643 PT Time Calculation (min) (ACUTE ONLY): 44 min  Charges:  $Gait Training: 23-37 mins $Therapeutic Exercise: 8-22 mins                     Lyanne Co, PT  Acute Rehab Services  Pager [phone] Office [phone]    Lawana Chambers Irasema Chalk 09/01/2018, 9:53 AM

## 2018-09-01 NOTE — Plan of Care (Signed)
  Problem: Education: Goal: Knowledge of General Education information will improve Description: Including pain rating scale, medication(s)/side effects and non-pharmacologic comfort measures Outcome: Progressing   Problem: Clinical Measurements: Goal: Ability to maintain clinical measurements within normal limits will improve Outcome: Progressing   Problem: Activity: Goal: Risk for activity intolerance will decrease Outcome: Progressing   Problem: Elimination: Goal: Will not experience complications related to bowel motility Outcome: Progressing   Problem: Safety: Goal: Ability to remain free from injury will improve Outcome: Progressing   

## 2018-09-01 NOTE — Progress Notes (Addendum)
PULMONARY / CRITICAL CARE MEDICINE   NAME:  ANCELMO GLUNZ, MRN:  [id number], DOB:  [birth year], LOS: 3 ADMISSION DATE:  08/29/2018, CONSULTATION DATE:  08/30/2018 REFERRING MD:  Shela Commons. CHIEF COMPLAINT:  Hypoxemia post-op for R total knee washout and poly exchange  HISTORY OF PRESENT ILLNESS   This is a 52 y.o W M who is s/p right total knee March, 2018. He  presented with pain in the knee with constitutional symptoms.  He underwent knee washout for infected R knee prosthesis on  the patient has persistent hypoxemia with a PCXR being suggestive of a patchy LLL infiltrate. Although able to be extubated, as the patient has a hx of OSA, PCCM was consulted to evaluate and manage the patient's hypoxemia.  SIGNIFICANT PAST MEDICAL HISTORY   OSA with home CPAP   SUBJECTIVE:  Only c/o R knee pain; no dyspoena, but desaturation with ambulation.  CONSTITUTIONAL: BP 110/60   Pulse 83   Temp (!) 97.5 F (36.4 C) (Oral)   Resp 18   Ht 6\' 2"  (1.88 m)   Wt (!) 145.3 kg   SpO2 97%   BMI 41.13 kg/m   I/O last 3 completed shifts: In: 600 [IV Piggyback:600] Out: 4081 [Urine:3700; Drains:381]  Now saturating 97% on RA with ambulation  PHYSICAL EXAM: General:  Awake and alert on RA Neuro:  A&Ox3. MAE x 4, appropriate HEENT:  NCAT, thick neck, No LAD Cardiovascular: S1, S2,  RRR, no M/R/G Lungs:  Clear to auscultation bilaterally, slightly diminished per bases Abdomen:  Protuberant but non-tender; ND +BS, Body mass index is 41.13 kg/m. Musculoskeletal:  RLE wrapped; on CPM machine,otherwise no active joints Skin: warm, dry, intact without lesions or rash  RESOLVED PROBLEM LIST   ASSESSMENT AND PLAN    Post-op hypoxemia Likely related to development of intra-op atelectasis in a patient with OSA. Hypoxemia now resolved>> 97% on RA with ambulation Continue CPAP at bedtime and with naps without fail Incentive spirometry.  Early ambulation and mobility. Add mucinex  Follow up in  pulmonary office as outpatient once discharged  Infected R knee prosthesis now status post washout. MSSA on cultures  Taper antibiotics to cefazolin and plan to complete 6 weeks of therapy.  PICC line in place  Per ID.  SUMMARY OF TODAY'S PLAN:   PCCM will sign off as patient is  on RA with sats of 97% with ambulation  Best Practice / Goals of Care / Disposition.   DVT PROPHYLAXIS: Lovenox SQ NUTRITION: Can have reg diet MOBILITY:Per ortho FAMILY DISCUSSIONS: Patient updated.  LABS   Relevant laboratory and other diagnostic studies were personally reviewed.  Resolving  leukocytosis at 10.5 on 9/17.  Joint culture form 9/14 positive for  MSSA.  Blood cultures are negative. MRSA PCR is negative. Per ID plan is for Ancef with goal of 6 weeks IV  Therapy with end date of 10/10/2018 followed by 6 months of Keflex Follow up with ID as outpatient  PCCM will sign off as hypoxia has resolved. CPAP at bedtime and with naps without fail Follow up with [facility] and PCP to assess download and assure continued resolution and improvement / evaluate for CPAP pressure changes as patient states he has had weight gain  since initiating CPAP therapy.  Pt. Updated at bedside. No family available   Bevelyn Ngo, AGACNP-BC [facility]  Pager: [phone]   09/01/2018  11:42 AM  Attending Note:  52 year old male with OSA history presenting  with MSSA joint cultures for whom there was a concern for PNA as well.  On exam, lungs are clear.  I reviewed CXR myself, no acute disease noted but mild interstitial edema noted.  Dicussed with PCCM-NP.  OSA:  - CPAP as ordered, may use home device  Hypoxemia: resolving  - Titrate O2 for sat of 88-92%  - Doubt will need home O2  Interstitial edema:  - Strict I/O  - KVO IVF  - No need or lasix at this time  Sepsis: MSSA  - Ancef for 6 wks  - Source control per ortho  PCCM will sign off, please call back if  needed.  Patient seen and examined, agree with above note.  I dictated the care and orders written for this patient under my direction.  Alyson Reedy, MD [phone]

## 2018-09-01 NOTE — Progress Notes (Signed)
Subjective: 2 Days Post-Op Procedure(s) (LRB): RIGHT TOTAL KNEE WASHOUT AND POLY EXCHANGE (Right) Patient reports pain as 4 on 0-10 scale.    Objective: Vital signs in last 24 hours: Temp:  [97.5 F (36.4 C)-98.2 F (36.8 C)] 97.5 F (36.4 C) (09/17 0402) Pulse Rate:  [68-88] 75 (09/17 0402) Resp:  [17-24] 18 (09/17 0402) BP: (109-136)/(67-80) 116/67 (09/17 0402) SpO2:  [88 %-99 %] 92 % (09/17 0402)  Intake/Output from previous day: 09/16 0701 - 09/17 0700 In: 500 [IV Piggyback:500] Out: 801 [Urine:800; Drains:1] Intake/Output this shift: No intake/output data recorded.  Recent Labs    08/29/18 1217 08/30/18 0319 08/31/18 0349 09/01/18 0410  HGB 15.0 13.0 13.0 11.8*   Recent Labs    08/31/18 0349 09/01/18 0410  WBC 12.6* 10.5  RBC 4.15* 3.79*  HCT 39.2 35.9*  PLT 196 218   Recent Labs    08/31/18 0349 09/01/18 0410  NA 138 139  K 4.4 3.9  CL 102 102  CO2 26 27  BUN 10 14  CREATININE 0.89 0.92  GLUCOSE 181* 108*  CALCIUM 8.4* 8.4*   No results for input(s): LABPT, INR in the last 72 hours.  ABD soft Neurovascular intact Sensation intact distally Incision: dressing C/D/I  Anticipated LOS equal to or greater than 2 midnights due to - Age 51 and older with one or more of the following:  - Obesity  - Expected need for hospital services (PT, OT, Nursing) required for safe  discharge  - Anticipated need for postoperative skilled nursing care or inpatient rehab  - Active co-morbidities: None OR   - Unanticipated findings during/Post Surgery: Infected knee joint or operative site  - Patient is a high risk of re-admission due to: None   Assessment/Plan: 2 Days Post-Op Procedure(s) (LRB): RIGHT TOTAL KNEE WASHOUT AND POLY EXCHANGE (Right)  Active Problems:   Infection of total right knee replacement (HCC)   Hypoxemia  Continue physical therapy.  Hope to discharge home tomorrow   Will advance drain today.    Norman Piacentini J Hyacinth Marcelli 09/01/2018, 6:41  AM

## 2018-09-01 NOTE — Progress Notes (Signed)
[place] [facility]  Date of Admission:  08/29/2018     Total days of antibiotics          ASSESSMENT/PLAN  Bryan [name] has a MSSA right knee prosthetic joint infection and POD #2 from poly exchange. He has remained afebrile with stable vital signs and is tolerating his Ancef. TTE negative for vegetations.  PICC line is in place. Ready for discharge form ID perspective.   1. Continue current dose of Ancef with goal of six weeks of IV therapy with end date of 10/10/18 followed by 6 months of Keflex.  2. Follow up in clinic in 4 weeks.  Diagnosis: MSSA Right knee prosthetic joint infection  Culture Result: MSSA  Allergies  Allergen Reactions  . Lipitor [Atorvastatin Calcium] Other (See Comments)    Muscle aches  . Penicillins Hives and Swelling    PATIENT HAS HAD KEFLEX WITH NO REACTION Swells "all over" Has patient had a PCN reaction causing immediate rash, facial/tongue/throat swelling, SOB or lightheadedness with hypotension: Yes Has patient had a PCN reaction causing severe rash involving mucus membranes or skin necrosis: Unk Has patient had a PCN reaction that required hospitalization: No, but went to Urgent Care Has patient had a PCN reaction occurring within the last 10 years: No If all of the above answers are "NO", then may proceed    OPAT Orders Discharge antibiotics: Cefazolin  Per pharmacy protocol   Duration:  6 weeks  End Date:  10/10/18  [facility] Care Per Protocol:  Labs weekly while on IV antibiotics: _X_ CBC with differential __ BMP _X_ CMP _X_ CRP __ ESR __ Vancomycin trough  _X_ Please pull PIC at completion of IV antibiotics __ Please leave PIC in place until doctor has seen patient or been notified  Fax weekly labs to [phone]  Clinic Follow Up Appt:  4 weeks with Terri Piedra, NP or Dr. Johnnye Sima  @  Active Problems:   Infection of total right knee replacement ([place])   Hypoxemia   . apixaban  2.5 mg Oral  Q12H  . docusate sodium  100 mg Oral BID  . famotidine  20 mg Oral BID  . folic acid  1 mg Oral Daily  . gabapentin  300 mg Oral QHS  . levothyroxine  300 mcg Oral QAC breakfast  . lisinopril  20 mg Oral Daily  . loratadine  10 mg Oral Daily  . multivitamin with minerals  1 tablet Oral Daily  . nebivolol  10 mg Oral Daily  . polyethylene glycol  17 g Oral Daily  . sodium chloride flush  10-40 mL Intracatheter Q12H  . tamsulosin  0.4 mg Oral QHS    SUBJECTIVE:  Afebrile overnight. Vital signs reviewed and stable overnight. WBC count of 10.5 Planning for discharge in the next 1-2 days. PICC line placed yesterday. Doing well today. Feels good and closer to baseline.    Allergies  Allergen Reactions  . Lipitor [Atorvastatin Calcium] Other (See Comments)    Muscle aches  . Penicillins Hives and Swelling    PATIENT HAS HAD KEFLEX WITH NO REACTION Swells "all over" Has patient had a PCN reaction causing immediate rash, facial/tongue/throat swelling, SOB or lightheadedness with hypotension: Yes Has patient had a PCN reaction causing severe rash involving mucus membranes or skin necrosis: Unk Has patient had a PCN reaction that required hospitalization: No, but went to Urgent Care Has patient had a PCN reaction occurring within the last 10 years: No If  all of the above answers are "NO", then may proceed     Review of Systems: Review of Systems  Constitutional: Negative for chills and fever.  Respiratory: Negative for cough, shortness of breath and wheezing.   Cardiovascular: Negative for chest pain and leg swelling.  Gastrointestinal: Negative for abdominal pain, constipation, diarrhea, nausea and vomiting.  Skin: Negative for rash.      OBJECTIVE: Vitals:   08/31/18 1600 08/31/18 1740 08/31/18 2005 09/01/18 0402  BP:  132/76 130/80 116/67  Pulse: 77 78 77 75  Resp:  '19 18 18  ' Temp: 98.2 F (36.8 C) 98 F (36.7 C) 98.1 F (36.7 C) (!) 97.5 F (36.4 C)  TempSrc:  Axillary Oral Oral Oral  SpO2: 98% 96% 95% 92%  Weight:      Height:       Body mass index is 41.13 kg/m.  Physical Exam  Constitutional: He is oriented to person, place, and time. He appears well-developed and well-nourished. No distress.  Cardiovascular: Normal rate, regular rhythm, normal heart sounds and intact distal pulses.  Pulmonary/Chest: Effort normal and breath sounds normal.  Neurological: He is alert and oriented to person, place, and time.  Skin: Skin is warm and dry.  Psychiatric: He has a normal mood and affect. His behavior is normal.    Lab Results Lab Results  Component Value Date   WBC 10.5 09/01/2018   HGB 11.8 (L) 09/01/2018   HCT 35.9 (L) 09/01/2018   MCV 94.7 09/01/2018   PLT 218 09/01/2018    Lab Results  Component Value Date   CREATININE 0.92 09/01/2018   BUN 14 09/01/2018   NA 139 09/01/2018   K 3.9 09/01/2018   CL 102 09/01/2018   CO2 27 09/01/2018    Lab Results  Component Value Date   ALT 32 08/29/2018   AST 23 08/29/2018   ALKPHOS 66 08/29/2018   BILITOT 1.2 08/29/2018     Microbiology: Recent Results (from the past 240 hour(s))  Body fluid culture     Status: None   Collection Time: 08/29/18 12:10 PM  Result Value Ref Range Status   Specimen Description SYNOVIAL  Final   Special Requests Normal  Final   Gram Stain   Final    ABUNDANT WBC PRESENT, PREDOMINANTLY PMN RARE GRAM POSITIVE COCCI IN CLUSTERS Performed at [facility], [place] [address], [place], [place] [address]    Culture FEW STAPHYLOCOCCUS AUREUS  Final   Report Status 08/31/2018 FINAL  Final   Organism ID, Bacteria STAPHYLOCOCCUS AUREUS  Final      Susceptibility   Staphylococcus aureus - MIC*    CIPROFLOXACIN <=0.5 SENSITIVE Sensitive     ERYTHROMYCIN <=0.25 SENSITIVE Sensitive     GENTAMICIN <=0.5 SENSITIVE Sensitive     OXACILLIN 1 SENSITIVE Sensitive     TETRACYCLINE <=1 SENSITIVE Sensitive     VANCOMYCIN <=0.5 SENSITIVE Sensitive     TRIMETH/SULFA  <=10 SENSITIVE Sensitive     CLINDAMYCIN <=0.25 SENSITIVE Sensitive     RIFAMPIN <=0.5 SENSITIVE Sensitive     Inducible Clindamycin NEGATIVE Sensitive     * FEW STAPHYLOCOCCUS AUREUS  Culture, blood (single)     Status: None (Preliminary result)   Collection Time: 08/29/18 12:17 PM  Result Value Ref Range Status   Specimen Description BLOOD LEFT ANTECUBITAL  Final   Special Requests   Final    BOTTLES DRAWN AEROBIC ONLY Blood Culture adequate volume   Culture   Final    NO  GROWTH 2 DAYS Performed at [facility], [place] [address], [place], [place] [address]    Report Status PENDING  Incomplete  Culture, blood (routine x 2)     Status: None (Preliminary result)   Collection Time: 08/29/18 12:32 PM  Result Value Ref Range Status   Specimen Description BLOOD RIGHT ANTECUBITAL  Final   Special Requests   Final    BOTTLES DRAWN AEROBIC ONLY Blood Culture adequate volume   Culture   Final    NO GROWTH 2 DAYS Performed at [facility], [place] [address], [place], [place] [address]    Report Status PENDING  Incomplete  MRSA PCR Screening     Status: None   Collection Time: 08/30/18  6:14 AM  Result Value Ref Range Status   MRSA by PCR NEGATIVE NEGATIVE Final    Comment:        The GeneXpert MRSA Assay (FDA approved for NASAL specimens only), is one component of a comprehensive MRSA colonization surveillance program. It is not intended to diagnose MRSA infection nor to guide or monitor treatment for MRSA infections. Performed at [facility], [place] [address], [place], [place] [address]   Aerobic/Anaerobic Culture (surgical/deep wound)     Status: None (Preliminary result)   Collection Time: 08/30/18  9:38 AM  Result Value Ref Range Status   Specimen Description TISSUE  Final   Special Requests NONE  Final   Gram Stain   Final    FEW WBC PRESENT, PREDOMINANTLY PMN FEW GRAM NEGATIVE RODS    Culture   Final    RARE STAPHYLOCOCCUS AUREUS SUSCEPTIBILITIES TO  FOLLOW Performed at [facility], [place] [address], [place], [place] [address]    Report Status PENDING  Incomplete     Terri Piedra, [place] [facility] [phone] Pager  09/01/2018  9:36 AM

## 2018-09-02 DIAGNOSIS — T8453XA Infection and inflammatory reaction due to internal right knee prosthesis, initial encounter: Secondary | ICD-10-CM | POA: Diagnosis not present

## 2018-09-02 DIAGNOSIS — A419 Sepsis, unspecified organism: Secondary | ICD-10-CM | POA: Diagnosis not present

## 2018-09-02 DIAGNOSIS — G4733 Obstructive sleep apnea (adult) (pediatric): Secondary | ICD-10-CM | POA: Diagnosis not present

## 2018-09-02 DIAGNOSIS — A4901 Methicillin susceptible Staphylococcus aureus infection, unspecified site: Secondary | ICD-10-CM

## 2018-09-02 LAB — CBC
HCT: 37.6 % — ABNORMAL LOW (ref 39.0–52.0)
HEMOGLOBIN: 12.2 g/dL — AB (ref 13.0–17.0)
MCH: 30.8 pg (ref 26.0–34.0)
MCHC: 32.4 g/dL (ref 30.0–36.0)
MCV: 94.9 fL (ref 78.0–100.0)
Platelets: 219 10*3/uL (ref 150–400)
RBC: 3.96 MIL/uL — ABNORMAL LOW (ref 4.22–5.81)
RDW: 12.8 % (ref 11.5–15.5)
WBC: 8.6 10*3/uL (ref 4.0–10.5)

## 2018-09-02 LAB — BASIC METABOLIC PANEL
Anion gap: 11 (ref 5–15)
BUN: 11 mg/dL (ref 6–20)
CHLORIDE: 101 mmol/L (ref 98–111)
CO2: 26 mmol/L (ref 22–32)
CREATININE: 0.85 mg/dL (ref 0.61–1.24)
Calcium: 8.2 mg/dL — ABNORMAL LOW (ref 8.9–10.3)
GFR calc Af Amer: >60 mL/min (ref >60)
GFR calc non Af Amer: >60 mL/min (ref >60)
Glucose, Bld: 96 mg/dL (ref 70–99)
Potassium: 3.7 mmol/L (ref 3.5–5.1)
Sodium: 138 mmol/L (ref 135–145)

## 2018-09-02 LAB — GLUCOSE, CAPILLARY
Glucose-Capillary: 132 mg/dL — ABNORMAL HIGH (ref 70–99)
Glucose-Capillary: 108 mg/dL — ABNORMAL HIGH (ref 70–99)
Glucose-Capillary: 108 mg/dL — ABNORMAL HIGH (ref 70–99)

## 2018-09-02 MED ORDER — GUAIFENESIN ER 600 MG PO TB12: 1200.0000 mg | ORAL_TABLET | Freq: Two times a day (BID) | ORAL | 0 refills | Status: DC

## 2018-09-02 MED ORDER — APIXABAN 2.5 MG PO TABS: 2.5000 mg | ORAL_TABLET | Freq: Two times a day (BID) | ORAL | 0 refills | Status: DC

## 2018-09-02 MED ORDER — CEFAZOLIN IV (FOR PTA / DISCHARGE USE ONLY): 2.0000 g | Freq: Three times a day (TID) | INTRAVENOUS | 0 refills | Status: AC

## 2018-09-02 MED ORDER — GABAPENTIN 300 MG PO CAPS: 300.0000 mg | ORAL_CAPSULE | Freq: Every day | ORAL | 0 refills | Status: DC

## 2018-09-02 MED ORDER — OXYCODONE HCL 5 MG PO TABS: ORAL_TABLET | ORAL | 0 refills | Status: DC

## 2018-09-02 NOTE — Care Management Note (Signed)
Case Management Note  Patient Details  Name: Bryan [name] MRN: [id number] Date of Birth: January 28, 1966  Subjective/Objective:      MSSA right knee prosthetic joint infection              Action/Plan: NCM spoke to pt and wife at bedside. Provided pt with Eliquis 30 day free trial card and $10 copay card. Contacted [facility] to make aware of dc home today with IV abx, PT and OT. Wife at home to assist with care. Pt has RW at home. Contacted [employer] for delivery of CPM for home.   Expected Discharge Date:  09/02/18               Expected Discharge Plan:  Home w Home Health Services  In-House Referral:  NA  Discharge planning Services  CM Consult  Post Acute Care Choice:  Home Health Choice offered to:  Patient  DME Arranged:  IV pump/equipment DME Agency:  [facility]  HH Arranged:  RN, IV Antibiotics, PT, OT HH Agency:  [facility]  Status of Service:  Completed, signed off  If discussed at Long Length of Stay Meetings, dates discussed:    Additional Comments:  Elliot Cousin, RN 09/02/2018, 2:37 PM

## 2018-09-02 NOTE — Progress Notes (Addendum)
Physical Therapy Treatment Patient Details Name: Bryan [name] MRN: [id number] DOB: 05/26/1966 Today's Date: 09/02/2018    History of Present Illness 52 y.o W M who is s/p right total knee March, 2018..Began having right knee pain and fever, chills and night sweats went to orthopedic urgent care where aspiration of his knee reveal obvious pus.  Cell count of his synovial fluid was greater than 100,000 white cells. Admited for OR Incision, irrigation and debridement as well as a poly exchange of his right total knee.patient has persistent hypoxemia with a PCXR being suggestive of a patchy LLL infiltrate. Although able to be extubated, as the patient has a hx of OSA, transferred to ICU.     PT Comments    Pt performed gait training, progression to stair training and seated exercises to prepare for d/c home this afternoon.  Plan to return home this pm and will have support from his wife.     Follow Up Recommendations  Home health PT;Supervision for mobility/OOB     Equipment Recommendations  None recommended by PT    Recommendations for Other Services       Precautions / Restrictions Precautions Precautions: None Restrictions Weight Bearing Restrictions: Yes RLE Weight Bearing: Weight bearing as tolerated    Mobility  Bed Mobility Overal bed mobility: Needs Assistance Bed Mobility: Supine to Sit;Sit to Supine     Supine to sit: Supervision     General bed mobility comments: Pt able to achieve sitting edge of bed unassisted.    Transfers Overall transfer level: Needs assistance Equipment used: Rolling walker (2 wheeled) Transfers: Sit to/from Stand Sit to Stand: Min guard;From elevated surface         General transfer comment: vc's for hand placement  Ambulation/Gait Ambulation/Gait assistance: Min guard Gait Distance (Feet): 300 Feet Assistive device: Rolling walker (2 wheeled) Gait Pattern/deviations: Step-to pattern;Decreased step length - right Gait  velocity: slowed   General Gait Details: Pt presents with R decreased knee flexion in swing phase.  Cues for toe of and heel strike on R side.  Pt remains to externally rotate R foot and circumduct hip to compensate for decreased ROM.     Stairs Stairs: Yes Stairs assistance: Min guard Stair Management: No rails;Step to pattern(used wall to brace L hand for support.  ) Number of Stairs: 6 General stair comments: Cues for sequencing and hand placement on wall.  Pt able to perform with cues for safety.     Wheelchair Mobility    Modified Rankin (Stroke Patients Only)       Balance     Sitting balance-Leahy Scale: Good       Standing balance-Leahy Scale: Fair                              Cognition Arousal/Alertness: Awake/alert Behavior During Therapy: WFL for tasks assessed/performed Overall Cognitive Status: Within Functional Limits for tasks assessed                                        Exercises Total Joint Exercises Long Arc Quad: AROM;Right;10 reps;Supine Knee Flexion: AROM;Right;10 reps;Seated;AAROM;20 reps(x10 AAROM and x10 AROM) Goniometric ROM: 10-60    General Comments        Pertinent Vitals/Pain Pain Assessment: 0-10 Pain Score: 5  Pain Location: R knee with movement Pain Descriptors / Indicators: Aching Pain  Intervention(s): Monitored during session;Limited activity within patient's tolerance    Home Living                      Prior Function            PT Goals (current goals can now be found in the care plan section) Acute Rehab PT Goals Patient Stated Goal: go home Potential to Achieve Goals: Good Progress towards PT goals: Progressing toward goals    Frequency    7X/week      PT Plan Current plan remains appropriate    Co-evaluation              AM-PAC PT "6 Clicks" Daily Activity  Outcome Measure  Difficulty turning over in bed (including adjusting bedclothes, sheets and  blankets)?: A Little Difficulty moving from lying on back to sitting on the side of the bed? : A Little Difficulty sitting down on and standing up from a chair with arms (e.g., wheelchair, bedside commode, etc,.)?: A Little Help needed moving to and from a bed to chair (including a wheelchair)?: A Little Help needed walking in hospital room?: A Little Help needed climbing 3-5 steps with a railing? : A Little 6 Click Score: 18    End of Session Equipment Utilized During Treatment: Gait belt Activity Tolerance: Patient tolerated treatment well   Nurse Communication: Mobility status PT Visit Diagnosis: Unsteadiness on feet (R26.81);Other abnormalities of gait and mobility (R26.89);Muscle weakness (generalized) (M62.81);Difficulty in walking, not elsewhere classified (R26.2);Pain Pain - Right/Left: Right Pain - part of body: Knee     Time: 5520-8022 PT Time Calculation (min) (ACUTE ONLY): 28 min  Charges:  $Gait Training: 8-22 mins $Therapeutic Exercise: 8-22 mins                     Joycelyn Rua, PTA Acute Rehabilitation Services Pager [phone] Office [phone]     Mariaelena Cade Artis Delay 09/02/2018, 5:33 PM

## 2018-09-02 NOTE — Discharge Summary (Signed)
Patient ID: Bryan [name] MRN: [id number] DOB/AGE: June 14, 1966 52 y.o.  Admit date: 08/29/2018 Discharge date: 09/02/2018  Admission Diagnoses:  Active Problems:   Infection of total right knee replacement (HCC)   Hypoxemia   OSA on CPAP   Sepsis ([place])   Edema   Discharge Diagnoses:  Same  Past Medical History:  Diagnosis Date  . Allergy    takes Claritin daily  . Elevated cholesterol    takes Crestor daily  . Enlarged prostate    takes Flomax nightly  . GERD (gastroesophageal reflux disease)    takes Zantac daily  . Hypertension    takes Lisinopril and Bystolic daily  . Hypothyroidism    takes Synthroid daily  . Medical history non-contributory   . Neuromuscular disorder (HCC)    bells palsy  . OSA (obstructive sleep apnea)    uses CPAP  . Primary localized osteoarthritis of left knee 03/25/2017  . Primary localized osteoarthritis of right knee   . S/P total knee arthroplasty, right 03/25/2017  . Vitamin D deficiency     Surgeries: Procedure(s): RIGHT TOTAL KNEE WASHOUT AND POLY EXCHANGE on 08/30/2018   Consultants:   Discharged Condition: Improved  Hospital Course: Bryan [name] is an 52 y.o. male who was admitted 08/29/2018 for operative treatment of<principal problem not specified>. Patient has severe unremitting pain that affects sleep, daily activities, and work/hobbies. After pre-op clearance the patient was taken to the operating room on 08/30/2018 and underwent  Procedure(s): RIGHT TOTAL KNEE WASHOUT AND POLY EXCHANGE.    Patient was given perioperative antibiotics:  Anti-infectives (From admission, onward)   Start     Dose/Rate Route Frequency Ordered Stop   09/02/18 0000  ceFAZolin (ANCEF) IVPB     2 g Intravenous Every 8 hours 09/02/18 1201 10/12/18 2359   08/31/18 1400  ceFAZolin (ANCEF) IVPB 2g/100 mL premix     2 g 200 mL/hr over 30 Minutes Intravenous Every 8 hours 08/31/18 1142     08/30/18 0808  cefUROXime (ZINACEF) injection  Status:   Discontinued       As needed 08/30/18 0809 08/30/18 1023   08/30/18 0645  tobramycin (NEBCIN) powder 1.2 g  Status:  Discontinued     1.2 g Topical To Surgery 08/30/18 0637 08/30/18 1058   08/30/18 0200  vancomycin (VANCOCIN) 1,500 mg in sodium chloride 0.9 % 500 mL IVPB  Status:  Discontinued     1,500 mg 250 mL/hr over 120 Minutes Intravenous Every 12 hours 08/29/18 1332 08/31/18 1140   08/29/18 1500  ceFAZolin (ANCEF) IVPB 2g/100 mL premix  Status:  Discontinued     2 g 200 mL/hr over 30 Minutes Intravenous Every 8 hours 08/29/18 1418 08/31/18 0926   08/29/18 1215  vancomycin (VANCOCIN) 2,500 mg in sodium chloride 0.9 % 500 mL IVPB     2,500 mg 250 mL/hr over 120 Minutes Intravenous  Once 08/29/18 1213 08/29/18 1613       Patient was given sequential compression devices, early ambulation, and chemoprophylaxis to prevent DVT.  In the OR patient desatted into the 80s   Anesthesia was able to extubate but unable to maintain acceptable O2 sats.  He was admitted by Critical Care to ICU from the OR.  He was placed on 10 L of Bipap   The next day he continued to do well.   Able to wean off the Bipap.  He was transferred to the ORtho floor for PT and PICC management.  He progressed well with PT  Cultures grew MSSA.  Switched to Ancef 2 grams IV q 8   He will go home on this.  He will also get home health PT and OT  Patient benefited maximally from hospital stay and there were no complications.    Recent vital signs:  Patient Vitals for the past 24 hrs:  BP Temp Temp src Pulse Resp SpO2  09/02/18 0439 121/67 - - 69 18 93 %  09/01/18 2035 135/75 98.2 F (36.8 C) Oral 78 18 95 %  09/01/18 1545 (!) 122/59 98.3 F (36.8 C) Oral 77 18 92 %     Recent laboratory studies:  Recent Labs    09/01/18 0410 09/02/18 0414  WBC 10.5 8.6  HGB 11.8* 12.2*  HCT 35.9* 37.6*  PLT 218 219  NA 139 138  K 3.9 3.7  CL 102 101  CO2 27 26  BUN 14 11  CREATININE 0.92 0.85  GLUCOSE 108* 96  CALCIUM 8.4*  8.2*     Discharge Medications:   Allergies as of 09/02/2018      Reactions   Lipitor [atorvastatin Calcium] Other (See Comments)   Muscle aches   Penicillins Hives, Swelling   PATIENT HAS HAD KEFLEX WITH NO REACTION Swells "all over" Has patient had a PCN reaction causing immediate rash, facial/tongue/throat swelling, SOB or lightheadedness with hypotension: Yes Has patient had a PCN reaction causing severe rash involving mucus membranes or skin necrosis: Unk Has patient had a PCN reaction that required hospitalization: No, but went to Urgent Care Has patient had a PCN reaction occurring within the last 10 years: No If all of the above answers are "NO", then may proceed      Medication List    STOP taking these medications   aspirin EC 325 MG tablet   diclofenac 75 MG EC tablet Commonly known as:  VOLTAREN   naproxen sodium 220 MG tablet Commonly known as:  ALEVE     TAKE these medications   acetaminophen 500 MG tablet Commonly known as:  TYLENOL Take 500-1,000 mg by mouth every 6 (six) hours as needed (for pain.).   apixaban 2.5 MG Tabs tablet Commonly known as:  ELIQUIS Take 1 tablet (2.5 mg total) by mouth every 12 (twelve) hours.   ceFAZolin  IVPB Commonly known as:  ANCEF Inject 2 g into the vein every 8 (eight) hours. Indication:  Prosthetic joint infection Last Day of Therapy:  10/10/18 Labs - Once weekly:  CBC/D and BMP, Labs - Every other week:  ESR and CRP   cetirizine 10 MG tablet Commonly known as:  ZYRTEC Take 10 mg by mouth See admin instructions. Take 10 mg by mouth once a day when not taking Claritin   clobetasol ointment 0.05 % Commonly known as:  TEMOVATE Apply 1 application topically daily as needed (to affected areas for psoriasis).   docusate sodium 100 MG capsule Commonly known as:  COLACE 1 tab 2 times a day while on narcotics.  STOOL SOFTENER What changed:    how much to take  how to take this  when to take this  reasons to  take this  additional instructions   gabapentin 300 MG capsule Commonly known as:  NEURONTIN Take 1 capsule (300 mg total) by mouth at bedtime.   guaiFENesin 600 MG 12 hr tablet Commonly known as:  MUCINEX Take 2 tablets (1,200 mg total) by mouth 2 (two) times daily.   levothyroxine 300 MCG tablet Commonly known as:  SYNTHROID, LEVOTHROID Take 300  mcg by mouth daily before breakfast.   lisinopril 20 MG tablet Commonly known as:  PRINIVIL,ZESTRIL Take 20 mg by mouth daily.   loratadine 10 MG tablet Commonly known as:  CLARITIN Take 10 mg by mouth daily.   MOISTURIZING LOTION EX Apply 1 application topically 3 (three) times daily as needed (for dry skin- [employer]'Ke[employer]fe's Skin Repair).   nebivolol 10 MG tablet Commonly known as:  BYSTOLIC Take 10 mg by mouth daily.   oxyCODONE 5 MG immediate release tablet Commonly known as:  Oxy IR/ROXICODONE Take 1 tabs po q 4 hours prn pain. What changed:  additional instructions   polyethylene glycol packet Commonly known as:  MIRALAX / GLYCOLAX 17grams in 6 oz of water twice a day until bowel movement.  LAXITIVE.  Restart if two days since last bowel movement   ranitidine 75 MG tablet Commonly known as:  ZANTAC Take 75-150 mg by mouth See admin instructions. Take 75 mg by mouth in the morning then 75 mg in the afternoon then 150 mg at bedtime   rosuvastatin 20 MG tablet Commonly known as:  CRESTOR Take 20 mg by mouth at bedtime.   tamsulosin 0.4 MG Caps capsule Commonly known as:  FLOMAX Take 0.4 mg by mouth at bedtime.            Home Infusion Instuctions  (From admission, onward)         Start     Ordered   09/02/18 0000  Home infusion instructions [facility] May follow [place] Dosing Protocol; May administer Cathflo as needed to maintain patency of vascular access device.; Flushing of vascular access device: per [facility] Protocol: 0.9% NaCl pre/post medica...    Question Answer Comment  Instructions May follow  [place] Dosing Protocol   Instructions May administer Cathflo as needed to maintain patency of vascular access device.   Instructions Flushing of vascular access device: per [facility] Protocol: 0.9% NaCl pre/post medication administration and prn patency; Heparin 100 u/ml, 60m for implanted ports and Heparin 10u/ml, 53mfor all other central venous catheters.   Instructions May follow [facility] Anaphylaxis Protocol for First Dose Administration in the home: 0.9% NaCl at 25-50 ml/hr to maintain IV access for protocol meds. Epinephrine 0.3 ml IV/IM PRN and Benadryl 25-50 IV/IM PRN s/s of anaphylaxis.   Instructions [facility] Infusion Coordinator (RN) to assist per patient IV care needs in the home PRN.      09/02/18 1201           Discharge Care Instructions  (From admission, onward)         Start     Ordered   09/02/18 0000  Change dressing    Comments:  DO NOT REMOVE BANDAGE OVER SURGICAL INCISION.  [place] INCLUDING OVER THE WATERPROOF BANDAGE WITH SOAP AND WATER EVERY DAY.   09/02/18 1206          Diagnostic Studies: Dg Chest Portable 1 View  Result Date: 08/30/2018 CLINICAL DATA:  Decreased oxygen saturations during surgery. EXAM: PORTABLE CHEST 1 VIEW COMPARISON:  02/18/2017 FINDINGS: The endotracheal tube is in good position at the mid tracheal level. The mediastinum is widened. This may be due to the low lung volumes and supine position of the patient but a follow-up upright chest film suggested. There is central vascular congestion, mild interstitial edema and a suspected left pleural effusion. Bibasilar atelectasis. IMPRESSION: Endotracheal tube in good position. Widened mediastinum may be due to low lung volumes and supine position  of the patient. Recommend a follow-up upright chest film. Low lung volumes with vascular congestion, mild interstitial edema, suspected small left effusion and streaky basilar atelectasis Electronically Signed   By: Marijo Sanes M.D.   On:  08/30/2018 10:28   [place] Ekg Site Rite  Result Date: 08/30/2018 If Site Rite image not attached, placement could not be confirmed due to current cardiac rhythm.   Disposition: Discharge disposition: 01-Home or Self Care       Discharge Instructions    CPM   Complete by:  As directed    Continuous passive motion machine (CPM):      Use the CPM from 0 to 90 for 6 hours per day.       You may break it up into 2 or 3 sessions per day.      Use CPM for 2 weeks or until you are told to stop.   Call MD / Call 911   Complete by:  As directed    If you experience chest pain or shortness of breath, CALL 911 and be transported to the hospital emergency room.  If you develope a fever above 101 F, pus (white drainage) or increased drainage or redness at the wound, or calf pain, call your surgeon's office.   Change dressing   Complete by:  As directed    DO NOT REMOVE BANDAGE OVER SURGICAL INCISION.  [place] WHOLE LEG INCLUDING OVER THE WATERPROOF BANDAGE WITH SOAP AND WATER EVERY DAY.   Constipation Prevention   Complete by:  As directed    Drink plenty of fluids.  Prune juice may be helpful.  You may use a stool softener, such as Colace (over the counter) 100 mg twice a day.  Use MiraLax (over the counter) for constipation as needed.   Diet - low sodium heart healthy   Complete by:  As directed    Discharge instructions   Complete by:  As directed    INSTRUCTIONS AFTER JOINT REPLACEMENT   Remove items at home which could result in a fall. This includes throw rugs or furniture in walking pathways ICE to the affected joint every three hours while awake for 30 minutes at a time, for at least the first 3-5 days, and then as needed for pain and swelling.  Continue to use ice for pain and swelling. You may notice swelling that will progress down to the foot and ankle.  This is normal after surgery.  Elevate your leg when you are not up walking on it.   Continue to use the breathing machine you got in  the hospital (incentive spirometer) which will help keep your temperature down.  It is common for your temperature to cycle up and down following surgery, especially at night when you are not up moving around and exerting yourself.  The breathing machine keeps your lungs expanded and your temperature down.   DIET:  As you were doing prior to hospitalization, we recommend a well-balanced diet.  DRESSING / WOUND CARE / SHOWERING  Keep the surgical dressing until follow up.  The dressing is water proof, so you can shower without any extra covering.  IF THE DRESSING FALLS OFF or the wound gets wet inside, change the dressing with sterile gauze.  Please use good hand washing techniques before changing the dressing.  Do not use any lotions or creams on the incision until instructed by your surgeon.    ACTIVITY  Increase activity slowly as tolerated, but follow the weight bearing instructions  below.   No driving for 6 weeks or until further direction given by your physician.  You cannot drive while taking narcotics.  No lifting or carrying greater than 10 lbs. until further directed by your surgeon. Avoid periods of inactivity such as sitting longer than an hour when not asleep. This helps prevent blood clots.  You may return to work once you are authorized by your doctor.     WEIGHT BEARING   Weight bearing as tolerated with assist device (walker, cane, etc) as directed, use it as long as suggested by your surgeon or therapist, typically at least 2-3 weeks.   EXERCISES  Results after joint replacement surgery are often greatly improved when you follow the exercise, range of motion and muscle strengthening exercises prescribed by your doctor. Safety measures are also important to protect the joint from further injury. Any time any of these exercises cause you to have increased pain or swelling, decrease what you are doing until you are comfortable again and then slowly increase them. If you have  problems or questions, call your caregiver or physical therapist for advice.   Rehabilitation is important following a joint replacement. After just a few days of immobilization, the muscles of the leg can become weakened and shrink (atrophy).  These exercises are designed to build up the tone and strength of the thigh and leg muscles and to improve motion. Often times heat used for twenty to thirty minutes before working out will loosen up your tissues and help with improving the range of motion but do not use heat for the first two weeks following surgery (sometimes heat can increase post-operative swelling).   These exercises can be done on a training (exercise) mat, on the floor, on a table or on a bed. Use whatever works the best and is most comfortable for you.    Use music or television while you are exercising so that the exercises are a pleasant break in your day. This will make your life better with the exercises acting as a break in your routine that you can look forward to.   Perform all exercises about fifteen times, three times per day or as directed.  You should exercise both the operative leg and the other leg as well.   Exercises include:  Quad Sets - Tighten up the muscle on the front of the thigh (Quad) and hold for 5-10 seconds.   Straight Leg Raises - With your knee straight (if you were given a brace, keep it on), lift the leg to 60 degrees, hold for 3 seconds, and slowly lower the leg.  Perform this exercise against resistance later as your leg gets stronger.  Leg Slides: Lying on your back, slowly slide your foot toward your buttocks, bending your knee up off the floor (only go as far as is comfortable). Then slowly slide your foot back down until your leg is flat on the floor again.  Angel Wings: Lying on your back spread your legs to the side as far apart as you can without causing discomfort.  Hamstring Strength:  Lying on your back, push your heel against the floor with your  leg straight by tightening up the muscles of your buttocks.  Repeat, but this time bend your knee to a comfortable angle, and push your heel against the floor.  You may put a pillow under the heel to make it more comfortable if necessary.   A rehabilitation program following joint replacement surgery can speed recovery and  prevent re-injury in the future due to weakened muscles. Contact your doctor or a physical therapist for more information on knee rehabilitation.    CONSTIPATION  Constipation is defined medically as fewer than three stools per week and severe constipation as less than one stool per week.  Even if you have a regular bowel pattern at home, your normal regimen is likely to be disrupted due to multiple reasons following surgery.  Combination of anesthesia, postoperative narcotics, change in appetite and fluid intake all can affect your bowels.   YOU MUST use at least one of the following options; they are listed in order of increasing strength to get the job done.  They are all available over the counter, and you may need to use some, POSSIBLY even all of these options:    Drink plenty of fluids (prune juice may be helpful) and high fiber foods Colace 100 mg by mouth twice a day  Senokot for constipation as directed and as needed Dulcolax (bisacodyl), take with full glass of water  Miralax (polyethylene glycol) once or twice a day as needed.  If you have tried all these things and are unable to have a bowel movement in the first 3-4 days after surgery call either your surgeon or your primary doctor.    If you experience loose stools or diarrhea, hold the medications until you stool forms back up.  If your symptoms do not get better within 1 week or if they get worse, check with your doctor.  If you experience "the worst abdominal pain ever" or develop nausea or vomiting, please contact the office immediately for further recommendations for treatment.   ITCHING:  If you experience  itching with your medications, try taking only a single pain pill, or even half a pain pill at a time.  You can also use Benadryl over the counter for itching or also to help with sleep.   TED HOSE STOCKINGS:  Use stockings on both legs until for at least 2 weeks or as directed by physician office. They may be removed at night for sleeping.  MEDICATIONS:  See your medication summary on the "After Visit Summary" that nursing will review with you.  You may have some home medications which will be placed on hold until you complete the course of blood thinner medication.  It is important for you to complete the blood thinner medication as prescribed.  PRECAUTIONS:  If you experience chest pain or shortness of breath - call 911 immediately for transfer to the hospital emergency department.   If you develop a fever greater that 101 F, purulent drainage from wound, increased redness or drainage from wound, foul odor from the wound/dressing, or calf pain - CONTACT YOUR SURGEON.                                                   FOLLOW-UP APPOINTMENTS:  If you do not already have a post-op appointment, please call the office for an appointment to be seen by your surgeon.  Guidelines for how soon to be seen are listed in your "After Visit Summary", but are typically between 1-4 weeks after surgery.  OTHER INSTRUCTIONS:   Knee Replacement:  Do not place pillow under knee, focus on keeping the knee straight while resting. CPM instructions: 0-90 degrees, 2 hours in the morning, 2 hours in  the afternoon, and 2 hours in the evening. Place foam block, curve side up under heel at all times except when in CPM or when walking.  DO NOT modify, tear, cut, or change the foam block in any way.  MAKE SURE YOU:  Understand these instructions.  Get help right away if you are not doing well or get worse.    Thank you for letting us be a part of your medical care team.  It is a privilege we respect greatly.  We hope these  instructions will help you stay on track for a fast and full recovery!   Do not put a pillow under the knee. Place it under the heel.   Complete by:  As directed    Place gray foam block, curve side up under heel at all times except when in CPM or when walking.  DO NOT modify, tear, cut, or change in any way the gray foam block.   Home infusion instructions [facility] May follow [place] Dosing Protocol; May administer Cathflo as needed to maintain patency of vascular access device.; Flushing of vascular access device: per [facility] Protocol: 0.9% NaCl pre/post medica...   Complete by:  As directed    Instructions:  May follow [place] Dosing Protocol   Instructions:  May administer Cathflo as needed to maintain patency of vascular access device.   Instructions:  Flushing of vascular access device: per [facility] Protocol: 0.9% NaCl pre/post medication administration and prn patency; Heparin 100 u/ml, 69m for implanted ports and Heparin 10u/ml, 571mfor all other central venous catheters.   Instructions:  May follow [facility] Anaphylaxis Protocol for First Dose Administration in the home: 0.9% NaCl at 25-50 ml/hr to maintain IV access for protocol meds. Epinephrine 0.3 ml IV/IM PRN and Benadryl 25-50 IV/IM PRN s/s of anaphylaxis.   Instructions:  [place] Coordinator (RN) to assist per patient IV care needs in the home PRN.   Increase activity slowly as tolerated   Complete by:  As directed    Patient may shower   Complete by:  As directed    Aquacel dressing is water proof    Wash over it and the whole leg with soap and water at the end of your shower   TED hose   Complete by:  As directed    Use stockings (TED hose) for 2 weeks on both leg(s).  You may remove them at night for sleeping.      [place] Follow up in 4 week(s).   Specialty:  Infectious Diseases Why:  Follow up with GrTerri PiedraNP or Dr.  HaFara Chutenformation: [id number]     WaElsie SaasMD Follow up on 09/10/2018.   Specialty:  Orthopedic Surgery Why:  appointment time 2:30 pm Contact information: [id number]          Signed: KiLinda Hedges/18/2019, 12:14 PM

## 2018-09-02 NOTE — Progress Notes (Signed)
Patient discharging home. Discharge instructions explained to patient and wife and they both verbalized understanding. Took all personal belongings. No further questions or concerns voiced.  

## 2018-09-02 NOTE — Progress Notes (Signed)
Asked to comment on pt's gram stain from 9-15 showing gram negative rods.  Would suspect this is lab error. His Cx from 9-14 grew MSSA, his Cx from 9-15 is showing Staph arues as well.  In addition, ancef has moderate gram negative rod coverage as well.  Will follow up his final Cx.

## 2018-09-02 NOTE — Plan of Care (Signed)

## 2018-09-03 DIAGNOSIS — A419 Sepsis, unspecified organism: Secondary | ICD-10-CM | POA: Diagnosis not present

## 2018-09-03 LAB — CULTURE, BLOOD (SINGLE)
Culture: NO GROWTH
Special Requests: ADEQUATE

## 2018-09-03 LAB — CULTURE, BLOOD (ROUTINE X 2)
Culture: NO GROWTH
Special Requests: ADEQUATE

## 2018-09-04 ENCOUNTER — Telehealth: Payer: Self-pay | Admitting: *Deleted

## 2018-09-04 DIAGNOSIS — G4733 Obstructive sleep apnea (adult) (pediatric): Secondary | ICD-10-CM | POA: Diagnosis not present

## 2018-09-04 DIAGNOSIS — R0902 Hypoxemia: Secondary | ICD-10-CM

## 2018-09-04 DIAGNOSIS — A419 Sepsis, unspecified organism: Secondary | ICD-10-CM | POA: Diagnosis not present

## 2018-09-04 DIAGNOSIS — Z451 Encounter for adjustment and management of infusion pump: Secondary | ICD-10-CM | POA: Diagnosis not present

## 2018-09-04 DIAGNOSIS — Z792 Long term (current) use of antibiotics: Secondary | ICD-10-CM | POA: Diagnosis not present

## 2018-09-04 DIAGNOSIS — E7800 Pure hypercholesterolemia, unspecified: Secondary | ICD-10-CM | POA: Diagnosis not present

## 2018-09-04 DIAGNOSIS — Z79891 Long term (current) use of opiate analgesic: Secondary | ICD-10-CM | POA: Diagnosis not present

## 2018-09-04 DIAGNOSIS — Z452 Encounter for adjustment and management of vascular access device: Secondary | ICD-10-CM | POA: Diagnosis not present

## 2018-09-04 DIAGNOSIS — Z7901 Long term (current) use of anticoagulants: Secondary | ICD-10-CM | POA: Diagnosis not present

## 2018-09-04 DIAGNOSIS — K219 Gastro-esophageal reflux disease without esophagitis: Secondary | ICD-10-CM | POA: Diagnosis not present

## 2018-09-04 DIAGNOSIS — I10 Essential (primary) hypertension: Secondary | ICD-10-CM | POA: Diagnosis not present

## 2018-09-04 DIAGNOSIS — E559 Vitamin D deficiency, unspecified: Secondary | ICD-10-CM | POA: Diagnosis not present

## 2018-09-04 DIAGNOSIS — T8453XA Infection and inflammatory reaction due to internal right knee prosthesis, initial encounter: Secondary | ICD-10-CM | POA: Diagnosis not present

## 2018-09-04 DIAGNOSIS — B9689 Other specified bacterial agents as the cause of diseases classified elsewhere: Secondary | ICD-10-CM | POA: Diagnosis not present

## 2018-09-04 DIAGNOSIS — I509 Heart failure, unspecified: Secondary | ICD-10-CM

## 2018-09-04 LAB — AEROBIC/ANAEROBIC CULTURE (SURGICAL/DEEP WOUND)

## 2018-09-04 LAB — AEROBIC/ANAEROBIC CULTURE W GRAM STAIN (SURGICAL/DEEP WOUND)

## 2018-09-04 NOTE — Telephone Encounter (Signed)
-----   Message from Vesta Mixer, MD sent at 09/04/2018  2:43 PM EDT ----- Regarding: work in patient Bryan [name], Will you add Bryan [name] to my schedule on sept. 25 at 3:00 Looks like I have an opening at that time  We need an echo and PA / LAT CXR - hopefully on Tuesday before the office visit   Call Jeff's wife, Waynetta Sandy with details - [phone]   Thanks  Michele Mcalpine

## 2018-09-04 NOTE — Telephone Encounter (Signed)
Spoke with wife and scheduled pt to see Dr. Elease Hashimoto 9/25.  Scheduled echo for 9/24.  Pt will have cxray same day as echo.  Wife verbalized understanding and was in agreement with this plan.

## 2018-09-07 DIAGNOSIS — B9689 Other specified bacterial agents as the cause of diseases classified elsewhere: Secondary | ICD-10-CM | POA: Diagnosis not present

## 2018-09-07 DIAGNOSIS — I10 Essential (primary) hypertension: Secondary | ICD-10-CM | POA: Diagnosis not present

## 2018-09-07 DIAGNOSIS — Z792 Long term (current) use of antibiotics: Secondary | ICD-10-CM | POA: Diagnosis not present

## 2018-09-07 DIAGNOSIS — G4733 Obstructive sleep apnea (adult) (pediatric): Secondary | ICD-10-CM | POA: Diagnosis not present

## 2018-09-07 DIAGNOSIS — A419 Sepsis, unspecified organism: Secondary | ICD-10-CM | POA: Diagnosis not present

## 2018-09-07 DIAGNOSIS — Z451 Encounter for adjustment and management of infusion pump: Secondary | ICD-10-CM | POA: Diagnosis not present

## 2018-09-07 DIAGNOSIS — E7800 Pure hypercholesterolemia, unspecified: Secondary | ICD-10-CM | POA: Diagnosis not present

## 2018-09-07 DIAGNOSIS — E559 Vitamin D deficiency, unspecified: Secondary | ICD-10-CM | POA: Diagnosis not present

## 2018-09-07 DIAGNOSIS — Z452 Encounter for adjustment and management of vascular access device: Secondary | ICD-10-CM | POA: Diagnosis not present

## 2018-09-07 DIAGNOSIS — Z79891 Long term (current) use of opiate analgesic: Secondary | ICD-10-CM | POA: Diagnosis not present

## 2018-09-07 DIAGNOSIS — T8453XA Infection and inflammatory reaction due to internal right knee prosthesis, initial encounter: Secondary | ICD-10-CM | POA: Diagnosis not present

## 2018-09-07 DIAGNOSIS — Z7901 Long term (current) use of anticoagulants: Secondary | ICD-10-CM | POA: Diagnosis not present

## 2018-09-07 DIAGNOSIS — K219 Gastro-esophageal reflux disease without esophagitis: Secondary | ICD-10-CM | POA: Diagnosis not present

## 2018-09-08 ENCOUNTER — Ambulatory Visit
Admission: RE | Admit: 2018-09-08 | Discharge: 2018-09-08 | Disposition: A | Discharge status: Home / Self Care | Payer: BLUE CROSS/BLUE SHIELD | Source: Ambulatory Visit | Attending: Cardiovascular Disease | Admitting: Cardiovascular Disease

## 2018-09-08 ENCOUNTER — Ambulatory Visit (HOSPITAL_COMMUNITY): Payer: BLUE CROSS/BLUE SHIELD | Attending: Cardiology

## 2018-09-08 ENCOUNTER — Other Ambulatory Visit: Payer: Self-pay

## 2018-09-08 DIAGNOSIS — I509 Heart failure, unspecified: Secondary | ICD-10-CM | POA: Insufficient documentation

## 2018-09-08 DIAGNOSIS — R0902 Hypoxemia: Secondary | ICD-10-CM

## 2018-09-08 DIAGNOSIS — G4733 Obstructive sleep apnea (adult) (pediatric): Secondary | ICD-10-CM | POA: Diagnosis not present

## 2018-09-08 DIAGNOSIS — E669 Obesity, unspecified: Secondary | ICD-10-CM | POA: Insufficient documentation

## 2018-09-08 DIAGNOSIS — Z6841 Body Mass Index (BMI) 40.0 and over, adult: Secondary | ICD-10-CM | POA: Insufficient documentation

## 2018-09-08 DIAGNOSIS — Z87891 Personal history of nicotine dependence: Secondary | ICD-10-CM | POA: Diagnosis not present

## 2018-09-08 DIAGNOSIS — Z8249 Family history of ischemic heart disease and other diseases of the circulatory system: Secondary | ICD-10-CM | POA: Diagnosis not present

## 2018-09-08 DIAGNOSIS — J811 Chronic pulmonary edema: Secondary | ICD-10-CM | POA: Diagnosis not present

## 2018-09-08 DIAGNOSIS — R0602 Shortness of breath: Secondary | ICD-10-CM | POA: Diagnosis not present

## 2018-09-08 MED ORDER — PERFLUTREN LIPID MICROSPHERE
1.0000 mL | INTRAVENOUS | Status: AC | PRN
  Administered 2018-09-08: 3 mL via INTRAVENOUS

## 2018-09-09 ENCOUNTER — Encounter: Payer: Self-pay | Admitting: Cardiovascular Disease

## 2018-09-09 ENCOUNTER — Ambulatory Visit: Payer: BLUE CROSS/BLUE SHIELD | Admitting: Cardiovascular Disease

## 2018-09-09 VITALS — BP 132/75 | HR 76 | Ht 74.0 in | Wt 325.0 lb

## 2018-09-09 DIAGNOSIS — G4733 Obstructive sleep apnea (adult) (pediatric): Secondary | ICD-10-CM | POA: Diagnosis not present

## 2018-09-09 DIAGNOSIS — A419 Sepsis, unspecified organism: Secondary | ICD-10-CM | POA: Diagnosis not present

## 2018-09-09 DIAGNOSIS — I110 Hypertensive heart disease with heart failure: Secondary | ICD-10-CM

## 2018-09-09 DIAGNOSIS — I10 Essential (primary) hypertension: Secondary | ICD-10-CM | POA: Diagnosis not present

## 2018-09-09 DIAGNOSIS — Z452 Encounter for adjustment and management of vascular access device: Secondary | ICD-10-CM | POA: Diagnosis not present

## 2018-09-09 DIAGNOSIS — Z451 Encounter for adjustment and management of infusion pump: Secondary | ICD-10-CM | POA: Diagnosis not present

## 2018-09-09 DIAGNOSIS — T8459XD Infection and inflammatory reaction due to other internal joint prosthesis, subsequent encounter: Secondary | ICD-10-CM | POA: Diagnosis not present

## 2018-09-09 DIAGNOSIS — K219 Gastro-esophageal reflux disease without esophagitis: Secondary | ICD-10-CM | POA: Diagnosis not present

## 2018-09-09 DIAGNOSIS — Z792 Long term (current) use of antibiotics: Secondary | ICD-10-CM | POA: Diagnosis not present

## 2018-09-09 DIAGNOSIS — B9689 Other specified bacterial agents as the cause of diseases classified elsewhere: Secondary | ICD-10-CM | POA: Diagnosis not present

## 2018-09-09 DIAGNOSIS — Z7901 Long term (current) use of anticoagulants: Secondary | ICD-10-CM | POA: Diagnosis not present

## 2018-09-09 DIAGNOSIS — I5033 Acute on chronic diastolic (congestive) heart failure: Secondary | ICD-10-CM

## 2018-09-09 DIAGNOSIS — E7800 Pure hypercholesterolemia, unspecified: Secondary | ICD-10-CM | POA: Diagnosis not present

## 2018-09-09 DIAGNOSIS — Z79891 Long term (current) use of opiate analgesic: Secondary | ICD-10-CM | POA: Diagnosis not present

## 2018-09-09 DIAGNOSIS — T8453XA Infection and inflammatory reaction due to internal right knee prosthesis, initial encounter: Secondary | ICD-10-CM | POA: Diagnosis not present

## 2018-09-09 DIAGNOSIS — E559 Vitamin D deficiency, unspecified: Secondary | ICD-10-CM | POA: Diagnosis not present

## 2018-09-09 MED ORDER — FUROSEMIDE 40 MG PO TABS: 40.0000 mg | ORAL_TABLET | ORAL | 3 refills | Status: DC

## 2018-09-09 MED ORDER — POTASSIUM CHLORIDE CRYS ER 20 MEQ PO TBCR: 20.0000 meq | EXTENDED_RELEASE_TABLET | ORAL | 3 refills | Status: DC

## 2018-09-09 NOTE — Patient Instructions (Addendum)
Medication Instructions:  Your physician has recommended you make the following change in your medication:   START Kdur (potassium supplement) 20 mEq every other day on same day as Lasix START Lasix (Furosemide) 40 mg every other day   Labwork: Your physician recommends that you return for lab work on Monday Oct. 21 for basic metabolic panel   Testing/Procedures: None Ordered   Follow-Up: Your physician recommends that you return for a follow-up appointment on Monday Oct. 21 at 2:40 pm   If you need a refill on your cardiac medications before your next appointment, please call your pharmacy.   Thank you for choosing [facility]! Eligha Bridegroom, RN [phone]

## 2018-09-09 NOTE — Progress Notes (Signed)
Cardiology Office Note:    Date:  09/09/2018   ID:  Bryan [name], DOB Sep 06, 1966, MRN [id number]  PCP:  Maury Dus, MD  Cardiologist:  Mertie Moores, MD  Electrophysiologist:  None   Problem lIst 1. Hypertensive heart disease with diastolic CHF 2. Knee replacement with recent infection and redo  R knee replacemet / polyexchange ( replaced plastic parts )  3.  OSA 4. Hypothyroidism 5. Hyperlipidemia   Referring MD: Maury Dus, MD   Chief Complaint  Patient presents with  . Hypertension  . Shortness of Breath    History of Present Illness:    Bryan [name] is a 52 y.o. male with a hx of HTN for years  Bilateral knee replacement .      OSA  -  Uses CPAP every night   4 weeks ago, developed a small bug bit like infection in his right ankle  Infection spread,   Developed cellulitis Became very fatigued,  Fever , chills,  Right knee replacement became infected.   Had right knee surgery  Became hypoxic during the surgery  Developed flash pulmonary edema during surgery . Spent 2 days in ICU.  Did not  Get lasix  O2 sats stayed around 93-94%.   O2 sats have remained low.  sats go down if he is sleepy  No CP  Has some DOE - with exertion for the past  He thinks its due to his weight.  Has gained weight over the past several years ,  More weight gain in the last several years .   Past Medical History:  Diagnosis Date  . Allergy    takes Claritin daily  . Elevated cholesterol    takes Crestor daily  . Enlarged prostate    takes Flomax nightly  . GERD (gastroesophageal reflux disease)    takes Zantac daily  . Hypertension    takes Lisinopril and Bystolic daily  . Hypothyroidism    takes Synthroid daily  . Medical history non-contributory   . Neuromuscular disorder (HCC)    bells palsy  . OSA (obstructive sleep apnea)    uses CPAP  . Primary localized osteoarthritis of left knee 03/25/2017  . Primary localized osteoarthritis of right knee   . S/P  total knee arthroplasty, right 03/25/2017  . Vitamin D deficiency     Past Surgical History:  Procedure Laterality Date  . I&D KNEE WITH POLY EXCHANGE Right 08/30/2018   Procedure: RIGHT TOTAL KNEE WASHOUT AND POLY EXCHANGE;  Surgeon: Elsie Saas, MD;  Location: [place];  Service: Orthopedics;  Laterality: Right;  . JOINT REPLACEMENT    . MENISCUS REPAIR Right 2017   torn meniscus  . NO PAST SURGERIES    . TOTAL KNEE ARTHROPLASTY Right 02/26/2017   Procedure: TOTAL KNEE ARTHROPLASTY;  Surgeon: Elsie Saas, MD;  Location: [place];  Service: Orthopedics;  Laterality: Right;  . TOTAL KNEE ARTHROPLASTY Left 04/07/2017  . TOTAL KNEE ARTHROPLASTY Left 04/07/2017   Procedure: LEFT TOTAL KNEE ARTHROPLASTY;  Surgeon: Elsie Saas, MD;  Location: [place];  Service: Orthopedics;  Laterality: Left;    Current Medications: Current Meds  Medication Sig  . acetaminophen (TYLENOL) 500 MG tablet Take 500-1,000 mg by mouth every 6 (six) hours as needed (for pain.).  Marland Kitchen apixaban (ELIQUIS) 2.5 MG TABS tablet Take 1 tablet (2.5 mg total) by mouth every 12 (twelve) hours.  Marland Kitchen ceFAZolin (ANCEF) IVPB Inject 2 g into the vein every 8 (eight) hours. Indication:  Prosthetic joint infection Last  Day of Therapy:  10/10/18 Labs - Once weekly:  CBC/D and BMP, Labs - Every other week:  ESR and CRP  . clobetasol ointment (TEMOVATE) 2.92 % Apply 1 application topically daily as needed (to affected areas for psoriasis).   . gabapentin (NEURONTIN) 300 MG capsule Take 1 capsule (300 mg total) by mouth at bedtime.  Marland Kitchen guaiFENesin (MUCINEX) 600 MG 12 hr tablet Take 2 tablets (1,200 mg total) by mouth 2 (two) times daily.  Marland Kitchen levothyroxine (SYNTHROID, LEVOTHROID) 300 MCG tablet Take 300 mcg by mouth daily before breakfast.   . lisinopril (PRINIVIL,ZESTRIL) 20 MG tablet Take 20 mg by mouth daily.   Marland Kitchen loratadine (CLARITIN) 10 MG tablet Take 10 mg by mouth daily.  . nebivolol (BYSTOLIC) 10 MG tablet Take 10 mg by mouth daily.  Marland Kitchen  oxyCODONE (ROXICODONE) 5 MG immediate release tablet Take 1 tabs po q 4 hours prn pain.  . ranitidine (ZANTAC) 75 MG tablet Take 75-150 mg by mouth See admin instructions. Take 75 mg by mouth in the morning then 75 mg in the afternoon then 150 mg at bedtime  . rosuvastatin (CRESTOR) 20 MG tablet Take 20 mg by mouth at bedtime.   . tamsulosin (FLOMAX) 0.4 MG CAPS capsule Take 0.4 mg by mouth at bedtime.     Allergies:   Lipitor [atorvastatin calcium] and Penicillins   Social History   Socioeconomic History  . Marital status: Married    Spouse name: Not on file  . Number of children: Not on file  . Years of education: Not on file  . Highest education level: Not on file  Occupational History  . Occupation: Chief Operating Officer    Comment: [employer]  . Financial resource strain: Not hard at all  . Food insecurity:    Worry: Never true    Inability: Never true  . Transportation needs:    Medical: No    Non-medical: No  Tobacco Use  . Smoking status: Never Smoker  . Smokeless tobacco: Former Network engineer and Sexual Activity  . Alcohol use: Yes    Alcohol/week: 3.0 standard drinks    Types: 3 Cans of beer per week    Comment: daily  . Drug use: No  . Sexual activity: Yes  Lifestyle  . Physical activity:    Days per week: 2 days    Minutes per session: 40 min  . Stress: Not at all  Relationships  . Social connections:    Talks on phone: More than three times a week    Gets together: More than three times a week    Attends religious service: 1 to 4 times per year    Active member of club or organization: No    Attends meetings of clubs or organizations: Never    Relationship status: Married  Other Topics Concern  . Not on file  Social History Narrative  . Not on file     Family History: The patient's family history includes Atrial fibrillation in his father; Deep vein thrombosis in his mother; Heart attack in his father; Hypothyroidism  in his unknown relative; Pulmonary embolism in his unknown relative; Rheum arthritis in his mother.  ROS:   Please see the history of present illness.     All other systems reviewed and are negative.  EKGs/Labs/Other Studies Reviewed:    The following studies were reviewed today:   EKG:     Recent Labs: 08/29/2018: ALT 32 09/02/2018: BUN 11;  Creatinine, Ser 0.85; Hemoglobin 12.2; Platelets 219; Potassium 3.7; Sodium 138  Recent Lipid Panel No results found for: CHOL, TRIG, HDL, CHOLHDL, VLDL, LDLCALC, LDLDIRECT  Physical Exam:    VS:  BP 132/75   Pulse 76   Ht '6\' 2"'  (1.88 m)   Wt (!) 325 lb (147.4 kg)   SpO2 96%   BMI 41.73 kg/m     Wt Readings from Last 3 Encounters:  09/09/18 (!) 325 lb (147.4 kg)  08/29/18 (!) 320 lb 5.3 oz (145.3 kg)  04/07/17 (!) 305 lb (138.3 kg)     GEN:   Middle age man  HEENT: Normal NECK: No JVD; No carotid bruits LYMPHATICS: No lymphadenopathy CARDIAC:   RR  RESPIRATORY:  Clear to auscultation without rales, wheezing or rhonchi  ABDOMEN: moderately obese  MUSCULOSKELETAL:  No edema; No deformity  SKIN: Warm and dry NEUROLOGIC:  Alert and oriented x 3 PSYCHIATRIC:  Normal affect   ASSESSMENT:    No diagnosis found. PLAN:    In order of problems listed above:  1.    Chronic diastolic CHF I personally reviewed the echocardiogram.  He has well-preserved left ventricular systolic function.  He has grade 2 diastolic dysfunction.  Suspect this is due to his long-standing hypertension and also his obesity. He admits eating extra salt.  Have advised him to work on restricting his salt. Lasix 40 mg every other  day , Kdur 20 meq QOD  Will see him back in several weeks to check BMP .   His hypoxemia seems to be gradually getting better.  It is quite possible that this was due to diffuse edema related to his sepsis syndrome.  We will start with the Lasix and I will see him back in the office in several weeks.   2.   OSA :   Needs to have  his CPAP checked.  My best thinks that he still having apneic episodes.  He is not aware of these.  3.   HTN: Blood pressure overall seems to be fairly well controlled.   Medication Adjustments/Labs and Tests Ordered: Current medicines are reviewed at length with the patient today.  Concerns regarding medicines are outlined above.  No orders of the defined types were placed in this encounter.  Meds ordered this encounter  Medications  . furosemide (LASIX) 40 MG tablet    Sig: Take 1 tablet (40 mg total) by mouth every other day.    Dispense:  45 tablet    Refill:  3  . potassium chloride SA (K-DUR,KLOR-CON) 20 MEQ tablet    Sig: Take 1 tablet (20 mEq total) by mouth every other day.    Dispense:  45 tablet    Refill:  3    Patient Instructions  Medication Instructions:  Your physician has recommended you make the following change in your medication:   START Kdur (potassium supplement) 20 mEq every other day on same day as Lasix START Lasix (Furosemide) 40 mg every other day   Labwork: Your physician recommends that you return for lab work on Monday Oct. 21 for basic metabolic panel   Testing/Procedures: None Ordered   Follow-Up: Your physician recommends that you return for a follow-up appointment on Monday Oct. 21 at 2:40 pm   If you need a refill on your cardiac medications before your next appointment, please call your pharmacy.   Thank you for choosing [facility]! Christen Bame, RN [phone]       Signed, Mertie Moores, MD  09/09/2018 6:24 PM    [facility]

## 2018-09-10 DIAGNOSIS — Z7901 Long term (current) use of anticoagulants: Secondary | ICD-10-CM | POA: Diagnosis not present

## 2018-09-10 DIAGNOSIS — I10 Essential (primary) hypertension: Secondary | ICD-10-CM | POA: Diagnosis not present

## 2018-09-10 DIAGNOSIS — A419 Sepsis, unspecified organism: Secondary | ICD-10-CM | POA: Diagnosis not present

## 2018-09-10 DIAGNOSIS — B9689 Other specified bacterial agents as the cause of diseases classified elsewhere: Secondary | ICD-10-CM | POA: Diagnosis not present

## 2018-09-10 DIAGNOSIS — Z451 Encounter for adjustment and management of infusion pump: Secondary | ICD-10-CM | POA: Diagnosis not present

## 2018-09-10 DIAGNOSIS — E559 Vitamin D deficiency, unspecified: Secondary | ICD-10-CM | POA: Diagnosis not present

## 2018-09-10 DIAGNOSIS — Z792 Long term (current) use of antibiotics: Secondary | ICD-10-CM | POA: Diagnosis not present

## 2018-09-10 DIAGNOSIS — E7800 Pure hypercholesterolemia, unspecified: Secondary | ICD-10-CM | POA: Diagnosis not present

## 2018-09-10 DIAGNOSIS — T8453XA Infection and inflammatory reaction due to internal right knee prosthesis, initial encounter: Secondary | ICD-10-CM | POA: Diagnosis not present

## 2018-09-10 DIAGNOSIS — Z79891 Long term (current) use of opiate analgesic: Secondary | ICD-10-CM | POA: Diagnosis not present

## 2018-09-10 DIAGNOSIS — K219 Gastro-esophageal reflux disease without esophagitis: Secondary | ICD-10-CM | POA: Diagnosis not present

## 2018-09-10 DIAGNOSIS — Z452 Encounter for adjustment and management of vascular access device: Secondary | ICD-10-CM | POA: Diagnosis not present

## 2018-09-10 DIAGNOSIS — T8459XD Infection and inflammatory reaction due to other internal joint prosthesis, subsequent encounter: Secondary | ICD-10-CM | POA: Diagnosis not present

## 2018-09-10 DIAGNOSIS — G4733 Obstructive sleep apnea (adult) (pediatric): Secondary | ICD-10-CM | POA: Diagnosis not present

## 2018-09-11 DIAGNOSIS — Z79891 Long term (current) use of opiate analgesic: Secondary | ICD-10-CM | POA: Diagnosis not present

## 2018-09-11 DIAGNOSIS — E7800 Pure hypercholesterolemia, unspecified: Secondary | ICD-10-CM | POA: Diagnosis not present

## 2018-09-11 DIAGNOSIS — K219 Gastro-esophageal reflux disease without esophagitis: Secondary | ICD-10-CM | POA: Diagnosis not present

## 2018-09-11 DIAGNOSIS — Z792 Long term (current) use of antibiotics: Secondary | ICD-10-CM | POA: Diagnosis not present

## 2018-09-11 DIAGNOSIS — I10 Essential (primary) hypertension: Secondary | ICD-10-CM | POA: Diagnosis not present

## 2018-09-11 DIAGNOSIS — A419 Sepsis, unspecified organism: Secondary | ICD-10-CM | POA: Diagnosis not present

## 2018-09-11 DIAGNOSIS — Z7901 Long term (current) use of anticoagulants: Secondary | ICD-10-CM | POA: Diagnosis not present

## 2018-09-11 DIAGNOSIS — E559 Vitamin D deficiency, unspecified: Secondary | ICD-10-CM | POA: Diagnosis not present

## 2018-09-11 DIAGNOSIS — B9689 Other specified bacterial agents as the cause of diseases classified elsewhere: Secondary | ICD-10-CM | POA: Diagnosis not present

## 2018-09-11 DIAGNOSIS — Z452 Encounter for adjustment and management of vascular access device: Secondary | ICD-10-CM | POA: Diagnosis not present

## 2018-09-11 DIAGNOSIS — T8453XA Infection and inflammatory reaction due to internal right knee prosthesis, initial encounter: Secondary | ICD-10-CM | POA: Diagnosis not present

## 2018-09-11 DIAGNOSIS — Z451 Encounter for adjustment and management of infusion pump: Secondary | ICD-10-CM | POA: Diagnosis not present

## 2018-09-11 DIAGNOSIS — G4733 Obstructive sleep apnea (adult) (pediatric): Secondary | ICD-10-CM | POA: Diagnosis not present

## 2018-09-14 DIAGNOSIS — Z451 Encounter for adjustment and management of infusion pump: Secondary | ICD-10-CM | POA: Diagnosis not present

## 2018-09-14 DIAGNOSIS — A419 Sepsis, unspecified organism: Secondary | ICD-10-CM | POA: Diagnosis not present

## 2018-09-14 DIAGNOSIS — B9689 Other specified bacterial agents as the cause of diseases classified elsewhere: Secondary | ICD-10-CM | POA: Diagnosis not present

## 2018-09-14 DIAGNOSIS — K219 Gastro-esophageal reflux disease without esophagitis: Secondary | ICD-10-CM | POA: Diagnosis not present

## 2018-09-14 DIAGNOSIS — T8453XA Infection and inflammatory reaction due to internal right knee prosthesis, initial encounter: Secondary | ICD-10-CM | POA: Diagnosis not present

## 2018-09-14 DIAGNOSIS — Z452 Encounter for adjustment and management of vascular access device: Secondary | ICD-10-CM | POA: Diagnosis not present

## 2018-09-14 DIAGNOSIS — I10 Essential (primary) hypertension: Secondary | ICD-10-CM | POA: Diagnosis not present

## 2018-09-14 DIAGNOSIS — E559 Vitamin D deficiency, unspecified: Secondary | ICD-10-CM | POA: Diagnosis not present

## 2018-09-14 DIAGNOSIS — G4733 Obstructive sleep apnea (adult) (pediatric): Secondary | ICD-10-CM | POA: Diagnosis not present

## 2018-09-14 DIAGNOSIS — E7800 Pure hypercholesterolemia, unspecified: Secondary | ICD-10-CM | POA: Diagnosis not present

## 2018-09-14 DIAGNOSIS — Z7901 Long term (current) use of anticoagulants: Secondary | ICD-10-CM | POA: Diagnosis not present

## 2018-09-14 DIAGNOSIS — Z79891 Long term (current) use of opiate analgesic: Secondary | ICD-10-CM | POA: Diagnosis not present

## 2018-09-14 DIAGNOSIS — Z792 Long term (current) use of antibiotics: Secondary | ICD-10-CM | POA: Diagnosis not present

## 2018-09-16 DIAGNOSIS — Z452 Encounter for adjustment and management of vascular access device: Secondary | ICD-10-CM | POA: Diagnosis not present

## 2018-09-16 DIAGNOSIS — K219 Gastro-esophageal reflux disease without esophagitis: Secondary | ICD-10-CM | POA: Diagnosis not present

## 2018-09-16 DIAGNOSIS — B9689 Other specified bacterial agents as the cause of diseases classified elsewhere: Secondary | ICD-10-CM | POA: Diagnosis not present

## 2018-09-16 DIAGNOSIS — Z7901 Long term (current) use of anticoagulants: Secondary | ICD-10-CM | POA: Diagnosis not present

## 2018-09-16 DIAGNOSIS — T8453XA Infection and inflammatory reaction due to internal right knee prosthesis, initial encounter: Secondary | ICD-10-CM | POA: Diagnosis not present

## 2018-09-16 DIAGNOSIS — Z79891 Long term (current) use of opiate analgesic: Secondary | ICD-10-CM | POA: Diagnosis not present

## 2018-09-16 DIAGNOSIS — Z451 Encounter for adjustment and management of infusion pump: Secondary | ICD-10-CM | POA: Diagnosis not present

## 2018-09-16 DIAGNOSIS — E559 Vitamin D deficiency, unspecified: Secondary | ICD-10-CM | POA: Diagnosis not present

## 2018-09-16 DIAGNOSIS — I10 Essential (primary) hypertension: Secondary | ICD-10-CM | POA: Diagnosis not present

## 2018-09-16 DIAGNOSIS — Z792 Long term (current) use of antibiotics: Secondary | ICD-10-CM | POA: Diagnosis not present

## 2018-09-16 DIAGNOSIS — E7800 Pure hypercholesterolemia, unspecified: Secondary | ICD-10-CM | POA: Diagnosis not present

## 2018-09-16 DIAGNOSIS — T8453XD Infection and inflammatory reaction due to internal right knee prosthesis, subsequent encounter: Secondary | ICD-10-CM | POA: Diagnosis not present

## 2018-09-16 DIAGNOSIS — A419 Sepsis, unspecified organism: Secondary | ICD-10-CM | POA: Diagnosis not present

## 2018-09-16 DIAGNOSIS — R7301 Impaired fasting glucose: Secondary | ICD-10-CM | POA: Diagnosis not present

## 2018-09-16 DIAGNOSIS — G4733 Obstructive sleep apnea (adult) (pediatric): Secondary | ICD-10-CM | POA: Diagnosis not present

## 2018-09-18 DIAGNOSIS — E559 Vitamin D deficiency, unspecified: Secondary | ICD-10-CM | POA: Diagnosis not present

## 2018-09-18 DIAGNOSIS — K219 Gastro-esophageal reflux disease without esophagitis: Secondary | ICD-10-CM | POA: Diagnosis not present

## 2018-09-18 DIAGNOSIS — G4733 Obstructive sleep apnea (adult) (pediatric): Secondary | ICD-10-CM | POA: Diagnosis not present

## 2018-09-18 DIAGNOSIS — Z452 Encounter for adjustment and management of vascular access device: Secondary | ICD-10-CM | POA: Diagnosis not present

## 2018-09-18 DIAGNOSIS — Z7901 Long term (current) use of anticoagulants: Secondary | ICD-10-CM | POA: Diagnosis not present

## 2018-09-18 DIAGNOSIS — I10 Essential (primary) hypertension: Secondary | ICD-10-CM | POA: Diagnosis not present

## 2018-09-18 DIAGNOSIS — E7800 Pure hypercholesterolemia, unspecified: Secondary | ICD-10-CM | POA: Diagnosis not present

## 2018-09-18 DIAGNOSIS — Z79891 Long term (current) use of opiate analgesic: Secondary | ICD-10-CM | POA: Diagnosis not present

## 2018-09-18 DIAGNOSIS — Z451 Encounter for adjustment and management of infusion pump: Secondary | ICD-10-CM | POA: Diagnosis not present

## 2018-09-18 DIAGNOSIS — T8453XA Infection and inflammatory reaction due to internal right knee prosthesis, initial encounter: Secondary | ICD-10-CM | POA: Diagnosis not present

## 2018-09-18 DIAGNOSIS — Z792 Long term (current) use of antibiotics: Secondary | ICD-10-CM | POA: Diagnosis not present

## 2018-09-18 DIAGNOSIS — A419 Sepsis, unspecified organism: Secondary | ICD-10-CM | POA: Diagnosis not present

## 2018-09-18 DIAGNOSIS — B9689 Other specified bacterial agents as the cause of diseases classified elsewhere: Secondary | ICD-10-CM | POA: Diagnosis not present

## 2018-09-21 ENCOUNTER — Encounter: Payer: Self-pay | Admitting: Cardiovascular Disease

## 2018-09-21 DIAGNOSIS — Z792 Long term (current) use of antibiotics: Secondary | ICD-10-CM | POA: Diagnosis not present

## 2018-09-21 DIAGNOSIS — E7800 Pure hypercholesterolemia, unspecified: Secondary | ICD-10-CM | POA: Diagnosis not present

## 2018-09-21 DIAGNOSIS — G4733 Obstructive sleep apnea (adult) (pediatric): Secondary | ICD-10-CM | POA: Diagnosis not present

## 2018-09-21 DIAGNOSIS — A419 Sepsis, unspecified organism: Secondary | ICD-10-CM | POA: Diagnosis not present

## 2018-09-21 DIAGNOSIS — I10 Essential (primary) hypertension: Secondary | ICD-10-CM | POA: Diagnosis not present

## 2018-09-21 DIAGNOSIS — B9689 Other specified bacterial agents as the cause of diseases classified elsewhere: Secondary | ICD-10-CM | POA: Diagnosis not present

## 2018-09-21 DIAGNOSIS — Z451 Encounter for adjustment and management of infusion pump: Secondary | ICD-10-CM | POA: Diagnosis not present

## 2018-09-21 DIAGNOSIS — E559 Vitamin D deficiency, unspecified: Secondary | ICD-10-CM | POA: Diagnosis not present

## 2018-09-21 DIAGNOSIS — Z79891 Long term (current) use of opiate analgesic: Secondary | ICD-10-CM | POA: Diagnosis not present

## 2018-09-21 DIAGNOSIS — T8453XA Infection and inflammatory reaction due to internal right knee prosthesis, initial encounter: Secondary | ICD-10-CM | POA: Diagnosis not present

## 2018-09-21 DIAGNOSIS — Z7901 Long term (current) use of anticoagulants: Secondary | ICD-10-CM | POA: Diagnosis not present

## 2018-09-21 DIAGNOSIS — Z452 Encounter for adjustment and management of vascular access device: Secondary | ICD-10-CM | POA: Diagnosis not present

## 2018-09-21 DIAGNOSIS — K219 Gastro-esophageal reflux disease without esophagitis: Secondary | ICD-10-CM | POA: Diagnosis not present

## 2018-09-22 DIAGNOSIS — A419 Sepsis, unspecified organism: Secondary | ICD-10-CM | POA: Diagnosis not present

## 2018-09-23 DIAGNOSIS — G4733 Obstructive sleep apnea (adult) (pediatric): Secondary | ICD-10-CM | POA: Diagnosis not present

## 2018-09-23 DIAGNOSIS — A419 Sepsis, unspecified organism: Secondary | ICD-10-CM | POA: Diagnosis not present

## 2018-09-23 DIAGNOSIS — T8453XA Infection and inflammatory reaction due to internal right knee prosthesis, initial encounter: Secondary | ICD-10-CM | POA: Diagnosis not present

## 2018-09-24 DIAGNOSIS — K219 Gastro-esophageal reflux disease without esophagitis: Secondary | ICD-10-CM | POA: Diagnosis not present

## 2018-09-24 DIAGNOSIS — G4733 Obstructive sleep apnea (adult) (pediatric): Secondary | ICD-10-CM | POA: Diagnosis not present

## 2018-09-24 DIAGNOSIS — Z452 Encounter for adjustment and management of vascular access device: Secondary | ICD-10-CM | POA: Diagnosis not present

## 2018-09-24 DIAGNOSIS — A419 Sepsis, unspecified organism: Secondary | ICD-10-CM | POA: Diagnosis not present

## 2018-09-24 DIAGNOSIS — E7800 Pure hypercholesterolemia, unspecified: Secondary | ICD-10-CM | POA: Diagnosis not present

## 2018-09-24 DIAGNOSIS — I10 Essential (primary) hypertension: Secondary | ICD-10-CM | POA: Diagnosis not present

## 2018-09-24 DIAGNOSIS — T8459XD Infection and inflammatory reaction due to other internal joint prosthesis, subsequent encounter: Secondary | ICD-10-CM | POA: Diagnosis not present

## 2018-09-24 DIAGNOSIS — T8453XA Infection and inflammatory reaction due to internal right knee prosthesis, initial encounter: Secondary | ICD-10-CM | POA: Diagnosis not present

## 2018-09-24 DIAGNOSIS — B9689 Other specified bacterial agents as the cause of diseases classified elsewhere: Secondary | ICD-10-CM | POA: Diagnosis not present

## 2018-09-24 DIAGNOSIS — Z7901 Long term (current) use of anticoagulants: Secondary | ICD-10-CM | POA: Diagnosis not present

## 2018-09-24 DIAGNOSIS — Z792 Long term (current) use of antibiotics: Secondary | ICD-10-CM | POA: Diagnosis not present

## 2018-09-24 DIAGNOSIS — Z451 Encounter for adjustment and management of infusion pump: Secondary | ICD-10-CM | POA: Diagnosis not present

## 2018-09-24 DIAGNOSIS — E559 Vitamin D deficiency, unspecified: Secondary | ICD-10-CM | POA: Diagnosis not present

## 2018-09-24 DIAGNOSIS — Z79891 Long term (current) use of opiate analgesic: Secondary | ICD-10-CM | POA: Diagnosis not present

## 2018-09-28 DIAGNOSIS — Z5181 Encounter for therapeutic drug level monitoring: Secondary | ICD-10-CM | POA: Insufficient documentation

## 2018-09-28 DIAGNOSIS — Z7901 Long term (current) use of anticoagulants: Secondary | ICD-10-CM | POA: Diagnosis not present

## 2018-09-28 DIAGNOSIS — G4733 Obstructive sleep apnea (adult) (pediatric): Secondary | ICD-10-CM | POA: Diagnosis not present

## 2018-09-28 DIAGNOSIS — Z79891 Long term (current) use of opiate analgesic: Secondary | ICD-10-CM | POA: Diagnosis not present

## 2018-09-28 DIAGNOSIS — Z452 Encounter for adjustment and management of vascular access device: Secondary | ICD-10-CM | POA: Diagnosis not present

## 2018-09-28 DIAGNOSIS — K219 Gastro-esophageal reflux disease without esophagitis: Secondary | ICD-10-CM | POA: Diagnosis not present

## 2018-09-28 DIAGNOSIS — Z451 Encounter for adjustment and management of infusion pump: Secondary | ICD-10-CM | POA: Diagnosis not present

## 2018-09-28 DIAGNOSIS — B9689 Other specified bacterial agents as the cause of diseases classified elsewhere: Secondary | ICD-10-CM | POA: Diagnosis not present

## 2018-09-28 DIAGNOSIS — Z792 Long term (current) use of antibiotics: Secondary | ICD-10-CM | POA: Diagnosis not present

## 2018-09-28 DIAGNOSIS — T8453XA Infection and inflammatory reaction due to internal right knee prosthesis, initial encounter: Secondary | ICD-10-CM | POA: Diagnosis not present

## 2018-09-28 DIAGNOSIS — E7800 Pure hypercholesterolemia, unspecified: Secondary | ICD-10-CM | POA: Diagnosis not present

## 2018-09-28 DIAGNOSIS — A419 Sepsis, unspecified organism: Secondary | ICD-10-CM | POA: Diagnosis not present

## 2018-09-28 DIAGNOSIS — Z95828 Presence of other vascular implants and grafts: Secondary | ICD-10-CM | POA: Insufficient documentation

## 2018-09-28 DIAGNOSIS — E559 Vitamin D deficiency, unspecified: Secondary | ICD-10-CM | POA: Diagnosis not present

## 2018-09-28 DIAGNOSIS — I10 Essential (primary) hypertension: Secondary | ICD-10-CM | POA: Diagnosis not present

## 2018-09-28 NOTE — Assessment & Plan Note (Signed)
Continue PICC line care and maintenance through 10/10/18. Please pull PICC line at end of treatment

## 2018-09-28 NOTE — Assessment & Plan Note (Addendum)
All OPAT lab work reviewed and within normal limits. Inflammatory markers noted to be reduced with antibiotic therapy.

## 2018-09-28 NOTE — Progress Notes (Signed)
Patient: Bryan [name]  DOB: [birth year] MRN: [id number] PCP: Maury Dus, MD  Referring Provider: Hospital Follow Up   Patient Active Problem List   Diagnosis Date Noted  . PICC (peripherally inserted central catheter) in place 10/01/2018  . Status post peripherally inserted central catheter (PICC) central line placement 09/28/2018  . Medication monitoring encounter 09/28/2018  . OSA on CPAP   . Edema   . Infection of total right knee replacement ([place]) 08/29/2018  . S/P total knee arthroplasty, right 03/25/2017  . Hyperlipidemia 02/14/2017  . Family history of heart disease 02/14/2017  . Obstructive sleep apnea   . Hypothyroidism   . Hypertension   . Vitamin D deficiency      Subjective:  Bryan [name] is a 52 y.o. man here today for follow up on his prosthetic knee joint infection.   He underwent bilateral total knee replacements (R on 02/26/17 and L on 04/07/17). He developed cellulitis of the R ankle after exposure to many insect bites that caused intense itching. He began having redness, swelling and fevers/chills/night sweats shortly after this as he drove back from [place] to [place]. He presented to surgeon's office and underwent R knee arthrocentesis revealing > 100,000 white cells c/w PJI. He underwent polyethylene exchange with thorough debridement and irrigation of what apparently was extensive synoviits. Dr. Archie Endo Op Note reviewed from 08/30/18 - excessive amt of purulent fluid in the joint; femoral, tibial and patellar components were well fixed to the bone. Significant synovitis in the knee. Synovial fluid from aspiration and intraoperative cultures all revealed MSSA; there was mention of GNRs on a gram stain from tissue however nothing grew out on culture aside from staph. He was discharged home on IV Cefazolin 2g Q8h. Due to apixaban and levothyroxine interactions it was decided to avoid addition of rifampin.   Since discharge home he has done very well with  regards to return of function and healing of his incision. His therapy is going very well also and achieving better ROM. He is finishing up a taper of prednisone give to him from his orthopedic team and his swelling has improved significantly. He was taken off of his apixaban and no plans to re-introduce this per his report. Doing well with PICC line and keeping up with his TID infusion of cefazolin without trouble or concern for side effect.   Review of Systems  Constitutional: Negative for chills, fever, malaise/fatigue and weight loss.  HENT: Negative for sore throat.   Respiratory: Negative for cough and sputum production.   Cardiovascular: Negative for chest pain and leg swelling.  Gastrointestinal: Negative for abdominal pain, diarrhea and vomiting.  Genitourinary: Negative for dysuria and flank pain.  Musculoskeletal: Positive for joint pain (R knee). Negative for myalgias and neck pain.  Skin: Negative for rash.  Neurological: Negative for dizziness, tingling and headaches.  Psychiatric/Behavioral: Negative for depression and substance abuse. The patient is not nervous/anxious and does not have insomnia.     Past Medical History:  Diagnosis Date  . Allergy    takes Claritin daily  . Elevated cholesterol    takes Crestor daily  . Enlarged prostate    takes Flomax nightly  . GERD (gastroesophageal reflux disease)    takes Zantac daily  . Hypertension    takes Lisinopril and Bystolic daily  . Hypothyroidism    takes Synthroid daily  . Medical history non-contributory   . Neuromuscular disorder (HCC)    bells palsy  . OSA (obstructive sleep  apnea)    uses CPAP  . Primary localized osteoarthritis of left knee 03/25/2017  . Primary localized osteoarthritis of right knee   . S/P total knee arthroplasty, right 03/25/2017  . Vitamin D deficiency     Outpatient Medications Prior to Visit  Medication Sig Dispense Refill  . acetaminophen (TYLENOL) 500 MG tablet Take 500-1,000 mg  by mouth every 6 (six) hours as needed (for pain.).    Marland Kitchen ceFAZolin (ANCEF) IVPB Inject 2 g into the vein every 8 (eight) hours. Indication:  Prosthetic joint infection Last Day of Therapy:  10/10/18 Labs - Once weekly:  CBC/D and BMP, Labs - Every other week:  ESR and CRP 117 Units 0  . clobetasol ointment (TEMOVATE) 0.24 % Apply 1 application topically daily as needed (to affected areas for psoriasis).     . furosemide (LASIX) 40 MG tablet Take 1 tablet (40 mg total) by mouth every other day. 45 tablet 3  . gabapentin (NEURONTIN) 300 MG capsule Take 1 capsule (300 mg total) by mouth at bedtime. 30 capsule 0  . guaiFENesin (MUCINEX) 600 MG 12 hr tablet Take 2 tablets (1,200 mg total) by mouth 2 (two) times daily. 40 tablet 0  . levothyroxine (SYNTHROID, LEVOTHROID) 300 MCG tablet Take 300 mcg by mouth daily before breakfast.     . lisinopril (PRINIVIL,ZESTRIL) 20 MG tablet Take 20 mg by mouth daily.     Marland Kitchen loratadine (CLARITIN) 10 MG tablet Take 10 mg by mouth daily.    . nebivolol (BYSTOLIC) 10 MG tablet Take 10 mg by mouth daily.    Marland Kitchen oxyCODONE (ROXICODONE) 5 MG immediate release tablet Take 1 tabs po q 4 hours prn pain. 42 tablet 0  . potassium chloride SA (K-DUR,KLOR-CON) 20 MEQ tablet Take 1 tablet (20 mEq total) by mouth every other day. 45 tablet 3  . ranitidine (ZANTAC) 75 MG tablet Take 75-150 mg by mouth See admin instructions. Take 75 mg by mouth in the morning then 75 mg in the afternoon then 150 mg at bedtime    . rosuvastatin (CRESTOR) 20 MG tablet Take 20 mg by mouth at bedtime.     . tamsulosin (FLOMAX) 0.4 MG CAPS capsule Take 0.4 mg by mouth at bedtime.    Marland Kitchen apixaban (ELIQUIS) 2.5 MG TABS tablet Take 1 tablet (2.5 mg total) by mouth every 12 (twelve) hours. (Patient not taking: Reported on 09/29/2018) 60 tablet 0   No facility-administered medications prior to visit.      Allergies  Allergen Reactions  . Lipitor [Atorvastatin Calcium] Other (See Comments)    Muscle aches    . Penicillins Hives and Swelling    PATIENT HAS HAD KEFLEX WITH NO REACTION Swells "all over" Has patient had a PCN reaction causing immediate rash, facial/tongue/throat swelling, SOB or lightheadedness with hypotension: Yes Has patient had a PCN reaction causing severe rash involving mucus membranes or skin necrosis: Unk Has patient had a PCN reaction that required hospitalization: No, but went to Urgent Care Has patient had a PCN reaction occurring within the last 10 years: No If all of the above answers are "NO", then may proceed    Social History   Tobacco Use  . Smoking status: Never Smoker  . Smokeless tobacco: Former Network engineer Use Topics  . Alcohol use: Yes    Alcohol/week: 3.0 standard drinks    Types: 3 Cans of beer per week    Comment: daily  . Drug use: No    Family History  Problem Relation Age of Onset  . Heart attack Father        stent x 2  . Atrial fibrillation Father   . Deep vein thrombosis Mother   . Rheum arthritis Mother   . Pulmonary embolism Unknown   . Hypothyroidism Unknown     Objective:   Vitals:   09/29/18 0924  BP: (!) 174/101  Pulse: 73  Temp: 98.2 F (36.8 C)  TempSrc: Oral  Weight: (!) 321 lb (145.6 kg)   Body mass index is 41.21 kg/m.  Physical Exam  Constitutional: He is oriented to person, place, and time. He appears well-developed and well-nourished.  Seated comfortably in chair during visit. Appears well today.   HENT:  Mouth/Throat: Oropharynx is clear and moist and mucous membranes are normal. Normal dentition. No dental abscesses.  Cardiovascular: Normal rate, regular rhythm and normal heart sounds.  No murmur heard. Pulmonary/Chest: Effort normal and breath sounds normal.  Abdominal: Soft. He exhibits no distension. There is no tenderness.  Musculoskeletal:  R knee incision healing well and approximated. Few small areas of scabs remain. No effusion, erythema, warmth or swelling when compared to contralateral  knee.   Lymphadenopathy:    He has no cervical adenopathy.  Neurological: He is alert and oriented to person, place, and time.  Skin: Skin is warm and dry. No rash noted.  Psychiatric: He has a normal mood and affect. Judgment normal.  Vitals reviewed. RUE PICC: clean, dry and intact with unremarkable insertion site.   Lab Results: Lab Results  Component Value Date   WBC 8.6 09/02/2018   HGB 12.2 (L) 09/02/2018   HCT 37.6 (L) 09/02/2018   MCV 94.9 09/02/2018   PLT 219 09/02/2018    Lab Results  Component Value Date   CREATININE 0.85 09/02/2018   BUN 11 09/02/2018   NA 138 09/02/2018   K 3.7 09/02/2018   CL 101 09/02/2018   CO2 26 09/02/2018    Lab Results  Component Value Date   ALT 32 08/29/2018   AST 23 08/29/2018   ALKPHOS 66 08/29/2018   BILITOT 1.2 08/29/2018     Assessment & Plan:   Problem List Items Addressed This Visit      Unprioritized   Hypertension    BP elevated today - I asked him to please follow up with his PCP on this as he has a strong family history of cardiac disease and heart failure.       Hypothyroidism    There is a moderate interaction with levothyroxine and rifampin. He is on a high dose (300 mcg) daily. Will check TSH in 4 weeks and forward to his PCP. I discussed this with him thoroughly and advised symptoms of hypothyroidism today.       Infection of total right knee replacement ([place]) - Primary    He is 4 weeks into therapy. Continue cefazolin as planned through end date of 10/26. With discontinuation of his NOAC I discussed with him that recommendation with staphylococcal infection of prosthetic joint is to add rifampin therapy. I will add this today and have home health check LFTs weekly x 2. Advised to take 600 mg QD with food. Counseled on orange effect of body secretions/urine and saliva. Advised that he can split it 300 mg BID should he have GI s/e.  He will return in 6 weeks.  I have given him a rx for cephalexin 500 mg TID to  start 10/27. He understands that he will continue on rifampin  therapy during this time frame as well.       Relevant Medications   cephALEXin (KEFLEX) 500 MG capsule (Start on 10/11/2018)   Medication monitoring encounter    All OPAT lab work reviewed and within normal limits. Inflammatory markers noted to be reduced with antibiotic therapy.       PICC (peripherally inserted central catheter) in place    Continue care and maintenance through end date. Home Health to pull after 10/26 doses.       Status post peripherally inserted central catheter (PICC) central line placement    Continue PICC line care and maintenance through 10/10/18. Please pull PICC line at end of treatment         Meds ordered this encounter  Medications  . rifampin (RIFADIN) 300 MG capsule    Sig: Take 1 capsule (300 mg total) by mouth 2 (two) times daily.    Dispense:  60 capsule    Refill:  6  . cephALEXin (KEFLEX) 500 MG capsule    Sig: Take 1 capsule (500 mg total) by mouth 3 (three) times daily.    Dispense:  90 capsule    Refill:  5   Return in about 6 weeks (around 11/10/2018).   Janene Madeira, MSN, NP-C [facility] [place] Pager: [phone] Office: [phone]  10/01/18  10:29 PM

## 2018-09-29 ENCOUNTER — Ambulatory Visit (INDEPENDENT_AMBULATORY_CARE_PROVIDER_SITE_OTHER): Payer: BLUE CROSS/BLUE SHIELD | Admitting: Infectious Diseases

## 2018-09-29 ENCOUNTER — Encounter: Payer: Self-pay | Admitting: Infectious Diseases

## 2018-09-29 VITALS — BP 174/101 | HR 73 | Temp 98.2°F | Wt 321.0 lb

## 2018-09-29 DIAGNOSIS — T8453XD Infection and inflammatory reaction due to internal right knee prosthesis, subsequent encounter: Secondary | ICD-10-CM | POA: Diagnosis not present

## 2018-09-29 DIAGNOSIS — E039 Hypothyroidism, unspecified: Secondary | ICD-10-CM | POA: Diagnosis not present

## 2018-09-29 DIAGNOSIS — Z5181 Encounter for therapeutic drug level monitoring: Secondary | ICD-10-CM

## 2018-09-29 DIAGNOSIS — Z452 Encounter for adjustment and management of vascular access device: Secondary | ICD-10-CM

## 2018-09-29 DIAGNOSIS — Z95828 Presence of other vascular implants and grafts: Secondary | ICD-10-CM | POA: Diagnosis not present

## 2018-09-29 DIAGNOSIS — I10 Essential (primary) hypertension: Secondary | ICD-10-CM

## 2018-09-29 MED ORDER — RIFAMPIN 300 MG PO CAPS: 300.0000 mg | ORAL_CAPSULE | Freq: Two times a day (BID) | ORAL | 6 refills | Status: DC

## 2018-09-29 MED ORDER — CEPHALEXIN 500 MG PO CAPS: 500.0000 mg | ORAL_CAPSULE | Freq: Three times a day (TID) | ORAL | 5 refills | Status: DC

## 2018-09-29 NOTE — Patient Instructions (Addendum)
Will add a second antibiotic for your knee called Rifampin - please take this every day with food. You can try taking two pills once a day OR one pill twice a day if you are nauseated.   Please continue the IV antibiotics until October 26th. Your home health team can remove the picc line at home the next day.   On October 27th will need to start taking your Keflex 1 pill three times a day IN ADDITION to the rifampin.   Will need to check your thyroid function tests monthly for a while as you may need a temporary dose increase. If you notice any cold intolerance, constipation, changes to your skin/nails.   Will see you back in 6 weeks to see how you are doing.

## 2018-10-01 DIAGNOSIS — Z451 Encounter for adjustment and management of infusion pump: Secondary | ICD-10-CM | POA: Diagnosis not present

## 2018-10-01 DIAGNOSIS — K219 Gastro-esophageal reflux disease without esophagitis: Secondary | ICD-10-CM | POA: Diagnosis not present

## 2018-10-01 DIAGNOSIS — A419 Sepsis, unspecified organism: Secondary | ICD-10-CM | POA: Diagnosis not present

## 2018-10-01 DIAGNOSIS — E559 Vitamin D deficiency, unspecified: Secondary | ICD-10-CM | POA: Diagnosis not present

## 2018-10-01 DIAGNOSIS — Z79891 Long term (current) use of opiate analgesic: Secondary | ICD-10-CM | POA: Diagnosis not present

## 2018-10-01 DIAGNOSIS — I10 Essential (primary) hypertension: Secondary | ICD-10-CM | POA: Diagnosis not present

## 2018-10-01 DIAGNOSIS — G4733 Obstructive sleep apnea (adult) (pediatric): Secondary | ICD-10-CM | POA: Diagnosis not present

## 2018-10-01 DIAGNOSIS — T8453XA Infection and inflammatory reaction due to internal right knee prosthesis, initial encounter: Secondary | ICD-10-CM | POA: Diagnosis not present

## 2018-10-01 DIAGNOSIS — E7800 Pure hypercholesterolemia, unspecified: Secondary | ICD-10-CM | POA: Diagnosis not present

## 2018-10-01 DIAGNOSIS — B9689 Other specified bacterial agents as the cause of diseases classified elsewhere: Secondary | ICD-10-CM | POA: Diagnosis not present

## 2018-10-01 DIAGNOSIS — Z7901 Long term (current) use of anticoagulants: Secondary | ICD-10-CM | POA: Diagnosis not present

## 2018-10-01 DIAGNOSIS — Z792 Long term (current) use of antibiotics: Secondary | ICD-10-CM | POA: Diagnosis not present

## 2018-10-01 DIAGNOSIS — Z452 Encounter for adjustment and management of vascular access device: Secondary | ICD-10-CM | POA: Diagnosis not present

## 2018-10-01 NOTE — Assessment & Plan Note (Signed)
Continue care and maintenance through end date. Home Health to pull after 10/26 doses.

## 2018-10-01 NOTE — Assessment & Plan Note (Signed)
BP elevated today - I asked him to please follow up with his PCP on this as he has a strong family history of cardiac disease and heart failure.

## 2018-10-01 NOTE — Assessment & Plan Note (Signed)
There is a moderate interaction with levothyroxine and rifampin. He is on a high dose (300 mcg) daily. Will check TSH in 4 weeks and forward to his PCP. I discussed this with him thoroughly and advised symptoms of hypothyroidism today.

## 2018-10-01 NOTE — Assessment & Plan Note (Signed)
He is 4 weeks into therapy. Continue cefazolin as planned through end date of 10/26. With discontinuation of his NOAC I discussed with him that recommendation with staphylococcal infection of prosthetic joint is to add rifampin therapy. I will add this today and have home health check LFTs weekly x 2. Advised to take 600 mg QD with food. Counseled on orange effect of body secretions/urine and saliva. Advised that he can split it 300 mg BID should he have GI s/e.  He will return in 6 weeks.  I have given him a rx for cephalexin 500 mg TID to start 10/27. He understands that he will continue on rifampin therapy during this time frame as well.

## 2018-10-05 ENCOUNTER — Encounter: Payer: Self-pay | Admitting: Cardiovascular Disease

## 2018-10-05 ENCOUNTER — Ambulatory Visit: Payer: BLUE CROSS/BLUE SHIELD | Admitting: Cardiovascular Disease

## 2018-10-05 VITALS — BP 150/94 | HR 90 | Ht 74.0 in | Wt 318.0 lb

## 2018-10-05 DIAGNOSIS — Z452 Encounter for adjustment and management of vascular access device: Secondary | ICD-10-CM | POA: Diagnosis not present

## 2018-10-05 DIAGNOSIS — I5032 Chronic diastolic (congestive) heart failure: Secondary | ICD-10-CM

## 2018-10-05 DIAGNOSIS — G4733 Obstructive sleep apnea (adult) (pediatric): Secondary | ICD-10-CM | POA: Diagnosis not present

## 2018-10-05 DIAGNOSIS — Z79891 Long term (current) use of opiate analgesic: Secondary | ICD-10-CM | POA: Diagnosis not present

## 2018-10-05 DIAGNOSIS — E559 Vitamin D deficiency, unspecified: Secondary | ICD-10-CM | POA: Diagnosis not present

## 2018-10-05 DIAGNOSIS — Z792 Long term (current) use of antibiotics: Secondary | ICD-10-CM | POA: Diagnosis not present

## 2018-10-05 DIAGNOSIS — K219 Gastro-esophageal reflux disease without esophagitis: Secondary | ICD-10-CM | POA: Diagnosis not present

## 2018-10-05 DIAGNOSIS — Z7901 Long term (current) use of anticoagulants: Secondary | ICD-10-CM | POA: Diagnosis not present

## 2018-10-05 DIAGNOSIS — B9689 Other specified bacterial agents as the cause of diseases classified elsewhere: Secondary | ICD-10-CM | POA: Diagnosis not present

## 2018-10-05 DIAGNOSIS — A419 Sepsis, unspecified organism: Secondary | ICD-10-CM | POA: Diagnosis not present

## 2018-10-05 DIAGNOSIS — T8453XA Infection and inflammatory reaction due to internal right knee prosthesis, initial encounter: Secondary | ICD-10-CM | POA: Diagnosis not present

## 2018-10-05 DIAGNOSIS — Z451 Encounter for adjustment and management of infusion pump: Secondary | ICD-10-CM | POA: Diagnosis not present

## 2018-10-05 DIAGNOSIS — E7800 Pure hypercholesterolemia, unspecified: Secondary | ICD-10-CM | POA: Diagnosis not present

## 2018-10-05 DIAGNOSIS — I10 Essential (primary) hypertension: Secondary | ICD-10-CM

## 2018-10-05 MED ORDER — OLMESARTAN MEDOXOMIL 40 MG PO TABS: 40.0000 mg | ORAL_TABLET | Freq: Every day | ORAL | 3 refills | Status: DC

## 2018-10-05 MED ORDER — NEBIVOLOL HCL 20 MG PO TABS: 20.0000 mg | ORAL_TABLET | Freq: Every day | ORAL | 3 refills | Status: DC

## 2018-10-05 NOTE — Progress Notes (Signed)
Cardiology Office Note:    Date:  10/05/2018   ID:  [name], DOB 1966/01/09, MRN [id number]  PCP:  Maury Dus, MD  Cardiologist:  Mertie Moores, MD  Electrophysiologist:  None   Problem lIst 1. Hypertensive heart disease with diastolic CHF 2. Knee replacement with recent infection and redo  R knee replacemet / polyexchange ( replaced plastic parts )  3.  OSA 4. Hypothyroidism 5. Hyperlipidemia   Referring MD: Maury Dus, MD   Chief Complaint  Patient presents with  . Congestive Heart Failure    History of Present Illness:    Bryan [name] is a 53 y.o. male with a hx of HTN for years  Bilateral knee replacement .      OSA  -  Uses CPAP every night   4 weeks ago, developed a small bug bit like infection in his right ankle  Infection spread,   Developed cellulitis Became very fatigued,  Fever , chills,  Right knee replacement became infected.   Had right knee surgery  Became hypoxic during the surgery  Developed flash pulmonary edema during surgery . Spent 2 days in ICU.  Did not  Get lasix  O2 sats stayed around 93-94%.   O2 sats have remained [name].  sats go down if he is sleepy  No CP  Has some DOE - with exertion for the past  He thinks its due to his weight.  Has gained weight over the past several years ,  More weight gain in the last several years .    Oct. 21, 2019: breathing is better  O2 sats = 94% today .  Right knee is better ,  Has an infection is his right knee replacement  - is on Rifampin  Just started diclofenac 75 BID - really helps his knee pain   BP has been elevated.  Is not eating a [name] salt diet   Past Medical History:  Diagnosis Date  . Allergy    takes Claritin daily  . Elevated cholesterol    takes Crestor daily  . Enlarged prostate    takes Flomax nightly  . GERD (gastroesophageal reflux disease)    takes Zantac daily  . Hypertension    takes Lisinopril and Bystolic daily  . Hypothyroidism    takes  Synthroid daily  . Medical history non-contributory   . Neuromuscular disorder (HCC)    bells palsy  . OSA (obstructive sleep apnea)    uses CPAP  . Primary localized osteoarthritis of left knee 03/25/2017  . Primary localized osteoarthritis of right knee   . S/P total knee arthroplasty, right 03/25/2017  . Vitamin D deficiency     Past Surgical History:  Procedure Laterality Date  . I&D KNEE WITH POLY EXCHANGE Right 08/30/2018   Procedure: RIGHT TOTAL KNEE WASHOUT AND POLY EXCHANGE;  Surgeon: Elsie Saas, MD;  Location: [facility];  Service: Orthopedics;  Laterality: Right;  . JOINT REPLACEMENT    . MENISCUS REPAIR Right 2017   torn meniscus  . NO PAST SURGERIES    . TOTAL KNEE ARTHROPLASTY Right 02/26/2017   Procedure: TOTAL KNEE ARTHROPLASTY;  Surgeon: Elsie Saas, MD;  Location: [facility];  Service: Orthopedics;  Laterality: Right;  . TOTAL KNEE ARTHROPLASTY Left 04/07/2017  . TOTAL KNEE ARTHROPLASTY Left 04/07/2017   Procedure: LEFT TOTAL KNEE ARTHROPLASTY;  Surgeon: Elsie Saas, MD;  Location: [facility];  Service: Orthopedics;  Laterality: Left;    Current Medications: Current Meds  Medication Sig  .  acetaminophen (TYLENOL) 500 MG tablet Take 500-1,000 mg by mouth every 6 (six) hours as needed (for pain.).  Marland Kitchen amLODipine (NORVASC) 5 MG tablet Take 5 mg by mouth daily.  Marland Kitchen ceFAZolin (ANCEF) IVPB Inject 2 g into the vein every 8 (eight) hours. Indication:  Prosthetic joint infection Last Day of Therapy:  10/10/18 Labs - Once weekly:  CBC/D and BMP, Labs - Every other week:  ESR and CRP  . clobetasol ointment (TEMOVATE) 3.81 % Apply 1 application topically daily as needed (to affected areas for psoriasis).   . furosemide (LASIX) 40 MG tablet Take 1 tablet (40 mg total) by mouth every other day.  . levothyroxine (SYNTHROID, LEVOTHROID) 300 MCG tablet Take 300 mcg by mouth daily before breakfast.   . potassium chloride SA (K-DUR,KLOR-CON) 20 MEQ tablet Take 1 tablet (20 mEq total) by  mouth every other day.  . ranitidine (ZANTAC) 75 MG tablet Take 75-150 mg by mouth See admin instructions. Take 75 mg by mouth in the morning then 75 mg in the afternoon then 150 mg at bedtime  . rifampin (RIFADIN) 300 MG capsule Take 1 capsule (300 mg total) by mouth 2 (two) times daily.  . rosuvastatin (CRESTOR) 20 MG tablet Take 20 mg by mouth at bedtime.   . tamsulosin (FLOMAX) 0.4 MG CAPS capsule Take 0.4 mg by mouth at bedtime.  . [DISCONTINUED] lisinopril (PRINIVIL,ZESTRIL) 20 MG tablet Take 40 mg by mouth daily.   . [DISCONTINUED] nebivolol (BYSTOLIC) 10 MG tablet Take 10 mg by mouth daily.     Allergies:   Lipitor [atorvastatin calcium] and Penicillins   Social History   Socioeconomic History  . Marital status: Married    Spouse name: Not on file  . Number of children: Not on file  . Years of education: Not on file  . Highest education level: Not on file  Occupational History  . Occupation: Chief Operating Officer    Comment: [employer]  . Financial resource strain: Not hard at all  . Food insecurity:    Worry: Never true    Inability: Never true  . Transportation needs:    Medical: No    Non-medical: No  Tobacco Use  . Smoking status: Never Smoker  . Smokeless tobacco: Former Network engineer and Sexual Activity  . Alcohol use: Yes    Alcohol/week: 3.0 standard drinks    Types: 3 Cans of beer per week    Comment: daily  . Drug use: No  . Sexual activity: Yes  Lifestyle  . Physical activity:    Days per week: 2 days    Minutes per session: 40 min  . Stress: Not at all  Relationships  . Social connections:    Talks on phone: More than three times a week    Gets together: More than three times a week    Attends religious service: 1 to 4 times per year    Active member of club or organization: No    Attends meetings of clubs or organizations: Never    Relationship status: Married  Other Topics Concern  . Not on file  Social History  Narrative  . Not on file     Family History: The patient's family history includes Atrial fibrillation in his father; Deep vein thrombosis in his mother; Heart attack in his father; Hypothyroidism in his unknown relative; Pulmonary embolism in his unknown relative; Rheum arthritis in his mother.  ROS:   Please see the history  of present illness.     All other systems reviewed and are negative.  EKGs/Labs/Other Studies Reviewed:    The following studies were reviewed today:   EKG:     Recent Labs: 08/29/2018: ALT 32 09/02/2018: BUN 11; Creatinine, Ser 0.85; Hemoglobin 12.2; Platelets 219; Potassium 3.7; Sodium 138  Recent Lipid Panel No results found for: CHOL, TRIG, HDL, CHOLHDL, VLDL, LDLCALC, LDLDIRECT  Physical Exam: Blood pressure (!) 150/94, pulse 90, height '6\' 2"'  (1.88 m), weight (!) 318 lb (144.2 kg), SpO2 94 %.  GEN:  Middle age,  Moderately obese male , NAD  HEENT: Normal NECK: No JVD; No carotid bruits LYMPHATICS: No lymphadenopathy CARDIAC: RRR   RESPIRATORY:  Clear to auscultation without rales, wheezing or rhonchi  ABDOMEN: Soft, non-tender, non-distended MUSCULOSKELETAL:  Right knee is swollen , not warm  SKIN: Warm and dry NEUROLOGIC:  Alert and oriented x 3   ASSESSMENT:    1. Chronic diastolic heart failure ([facility])   2. Essential hypertension    PLAN:    In order of problems listed above:  1.   Chronic diastolic CHF-seems to be feeling better.  Continue Lasix every other day.   We may need to increase to QD dosing of the lasix I've advised him to watch his salt  Check BMP today and in 3 week s   2.   OSA :     3.   HTN:  BP is a bit elevated.  DC lisinopril Start Olmasartan 40 mg a day  - check BMP in 3 weeks  Increase bystolic to 20 mg a day    Medication Adjustments/Labs and Tests Ordered: Current medicines are reviewed at length with the patient today.  Concerns regarding medicines are outlined above.  Orders Placed This Encounter    Procedures  . Basic Metabolic Panel (BMET)  . Basic Metabolic Panel (BMET)   Meds ordered this encounter  Medications  . Nebivolol HCl 20 MG TABS    Sig: Take 1 tablet (20 mg total) by mouth daily.    Dispense:  90 tablet    Refill:  3  . olmesartan (BENICAR) 40 MG tablet    Sig: Take 1 tablet (40 mg total) by mouth daily.    Dispense:  90 tablet    Refill:  3    Patient Instructions  Medication Instructions:  Your physician has recommended you make the following change in your medication:   STOP Lisinopril START Olmesartan (Benicar) 40 mg once daily INCREASE Bystolic (Nebivolol) to 20 mg once daily  If you need a refill on your cardiac medications before your next appointment, please call your pharmacy.   Lab work: TODAY - Medical laboratory scientific officer  Your physician recommends that you return for lab work in: 3 weeks for basic metabolic panel  If you have labs (blood work) drawn today and your tests are completely normal, you will receive your results only by: Marland Kitchen MyChart Message (if you have MyChart) OR . A paper copy in the mail If you have any lab test that is abnormal or we need to change your treatment, we will call you to review the results.  Testing/Procedures: None Ordered   Follow-Up: At [facility], you and your health needs are our priority.  As part of our continuing mission to provide you with exceptional heart care, we have created designated Provider Care Teams.  These Care Teams include your primary Cardiologist (physician) and Advanced Practice Providers (APPs -  Physician Assistants and Nurse Practitioners)  who all work together to provide you with the care you need, when you need it. You will need a follow up appointment in:  3 months. You may see Mertie Moores, MD or one of the following Advanced Practice Providers on your designated Care Team: Richardson Dopp, PA-C [place], [place] . Daune Perch, NP       Signed, Mertie Moores, MD  10/05/2018  6:21 PM    [place]

## 2018-10-05 NOTE — Patient Instructions (Addendum)
Medication Instructions:  Your physician has recommended you make the following change in your medication:   STOP Lisinopril START Olmesartan (Benicar) 40 mg once daily INCREASE Bystolic (Nebivolol) to 20 mg once daily  If you need a refill on your cardiac medications before your next appointment, please call your pharmacy.   Lab work: TODAY - Adult nurse  Your physician recommends that you return for lab work in: 3 weeks for basic metabolic panel  If you have labs (blood work) drawn today and your tests are completely normal, you will receive your results only by: Marland Kitchen MyChart Message (if you have MyChart) OR . A paper copy in the mail If you have any lab test that is abnormal or we need to change your treatment, we will call you to review the results.  Testing/Procedures: None Ordered   Follow-Up: At [facility], you and your health needs are our priority.  As part of our continuing mission to provide you with exceptional heart care, we have created designated Provider Care Teams.  These Care Teams include your primary Cardiologist (physician) and Advanced Practice Providers (APPs -  Physician Assistants and Nurse Practitioners) who all work together to provide you with the care you need, when you need it. You will need a follow up appointment in:  3 months. You may see Kristeen Miss, MD or one of the following Advanced Practice Providers on your designated Care Team: Tereso Newcomer, PA-C [place], [place] . Berton Bon, NP

## 2018-10-06 DIAGNOSIS — T8453XA Infection and inflammatory reaction due to internal right knee prosthesis, initial encounter: Secondary | ICD-10-CM | POA: Diagnosis not present

## 2018-10-06 DIAGNOSIS — A419 Sepsis, unspecified organism: Secondary | ICD-10-CM | POA: Diagnosis not present

## 2018-10-06 LAB — BASIC METABOLIC PANEL
BUN/Creatinine Ratio: 15 (ref 9–20)
BUN: 12 mg/dL (ref 6–24)
CALCIUM: 9 mg/dL (ref 8.7–10.2)
CHLORIDE: 102 mmol/L (ref 96–106)
CO2: 24 mmol/L (ref 20–29)
CREATININE: 0.81 mg/dL (ref 0.76–1.27)
GFR calc Af Amer: 119 mL/min/{1.73_m2} (ref >59)
GFR calc non Af Amer: 103 mL/min/{1.73_m2} (ref >59)
GLUCOSE: 112 mg/dL — AB (ref 65–99)
Potassium: 4.1 mmol/L (ref 3.5–5.2)
Sodium: 140 mmol/L (ref 134–144)

## 2018-10-07 DIAGNOSIS — T8453XA Infection and inflammatory reaction due to internal right knee prosthesis, initial encounter: Secondary | ICD-10-CM | POA: Diagnosis not present

## 2018-10-07 DIAGNOSIS — G4733 Obstructive sleep apnea (adult) (pediatric): Secondary | ICD-10-CM | POA: Diagnosis not present

## 2018-10-08 DIAGNOSIS — Z79891 Long term (current) use of opiate analgesic: Secondary | ICD-10-CM | POA: Diagnosis not present

## 2018-10-08 DIAGNOSIS — T8459XD Infection and inflammatory reaction due to other internal joint prosthesis, subsequent encounter: Secondary | ICD-10-CM | POA: Diagnosis not present

## 2018-10-08 DIAGNOSIS — Z7901 Long term (current) use of anticoagulants: Secondary | ICD-10-CM | POA: Diagnosis not present

## 2018-10-08 DIAGNOSIS — Z451 Encounter for adjustment and management of infusion pump: Secondary | ICD-10-CM | POA: Diagnosis not present

## 2018-10-08 DIAGNOSIS — Z452 Encounter for adjustment and management of vascular access device: Secondary | ICD-10-CM | POA: Diagnosis not present

## 2018-10-08 DIAGNOSIS — A419 Sepsis, unspecified organism: Secondary | ICD-10-CM | POA: Diagnosis not present

## 2018-10-08 DIAGNOSIS — E7800 Pure hypercholesterolemia, unspecified: Secondary | ICD-10-CM | POA: Diagnosis not present

## 2018-10-08 DIAGNOSIS — T8453XA Infection and inflammatory reaction due to internal right knee prosthesis, initial encounter: Secondary | ICD-10-CM | POA: Diagnosis not present

## 2018-10-08 DIAGNOSIS — I10 Essential (primary) hypertension: Secondary | ICD-10-CM | POA: Diagnosis not present

## 2018-10-08 DIAGNOSIS — Z792 Long term (current) use of antibiotics: Secondary | ICD-10-CM | POA: Diagnosis not present

## 2018-10-08 DIAGNOSIS — E559 Vitamin D deficiency, unspecified: Secondary | ICD-10-CM | POA: Diagnosis not present

## 2018-10-08 DIAGNOSIS — K219 Gastro-esophageal reflux disease without esophagitis: Secondary | ICD-10-CM | POA: Diagnosis not present

## 2018-10-08 DIAGNOSIS — G4733 Obstructive sleep apnea (adult) (pediatric): Secondary | ICD-10-CM | POA: Diagnosis not present

## 2018-10-08 DIAGNOSIS — B9689 Other specified bacterial agents as the cause of diseases classified elsewhere: Secondary | ICD-10-CM | POA: Diagnosis not present

## 2018-10-11 DIAGNOSIS — A419 Sepsis, unspecified organism: Secondary | ICD-10-CM | POA: Diagnosis not present

## 2018-10-12 DIAGNOSIS — I10 Essential (primary) hypertension: Secondary | ICD-10-CM | POA: Diagnosis not present

## 2018-10-12 DIAGNOSIS — Z79891 Long term (current) use of opiate analgesic: Secondary | ICD-10-CM | POA: Diagnosis not present

## 2018-10-12 DIAGNOSIS — B9689 Other specified bacterial agents as the cause of diseases classified elsewhere: Secondary | ICD-10-CM | POA: Diagnosis not present

## 2018-10-12 DIAGNOSIS — Z452 Encounter for adjustment and management of vascular access device: Secondary | ICD-10-CM | POA: Diagnosis not present

## 2018-10-12 DIAGNOSIS — T8453XA Infection and inflammatory reaction due to internal right knee prosthesis, initial encounter: Secondary | ICD-10-CM | POA: Diagnosis not present

## 2018-10-12 DIAGNOSIS — Z7901 Long term (current) use of anticoagulants: Secondary | ICD-10-CM | POA: Diagnosis not present

## 2018-10-12 DIAGNOSIS — K219 Gastro-esophageal reflux disease without esophagitis: Secondary | ICD-10-CM | POA: Diagnosis not present

## 2018-10-12 DIAGNOSIS — E7800 Pure hypercholesterolemia, unspecified: Secondary | ICD-10-CM | POA: Diagnosis not present

## 2018-10-12 DIAGNOSIS — A419 Sepsis, unspecified organism: Secondary | ICD-10-CM | POA: Diagnosis not present

## 2018-10-12 DIAGNOSIS — G4733 Obstructive sleep apnea (adult) (pediatric): Secondary | ICD-10-CM | POA: Diagnosis not present

## 2018-10-12 DIAGNOSIS — E559 Vitamin D deficiency, unspecified: Secondary | ICD-10-CM | POA: Diagnosis not present

## 2018-10-12 DIAGNOSIS — Z792 Long term (current) use of antibiotics: Secondary | ICD-10-CM | POA: Diagnosis not present

## 2018-10-12 DIAGNOSIS — Z451 Encounter for adjustment and management of infusion pump: Secondary | ICD-10-CM | POA: Diagnosis not present

## 2018-10-23 ENCOUNTER — Encounter: Payer: Self-pay | Admitting: Behavioral Health

## 2018-10-23 ENCOUNTER — Telehealth: Payer: Self-pay | Admitting: Behavioral Health

## 2018-10-23 ENCOUNTER — Other Ambulatory Visit: Payer: Self-pay | Admitting: Behavioral Health

## 2018-10-23 DIAGNOSIS — E039 Hypothyroidism, unspecified: Secondary | ICD-10-CM

## 2018-10-23 DIAGNOSIS — I5032 Chronic diastolic (congestive) heart failure: Secondary | ICD-10-CM

## 2018-10-23 DIAGNOSIS — I10 Essential (primary) hypertension: Secondary | ICD-10-CM

## 2018-10-23 DIAGNOSIS — T8453XD Infection and inflammatory reaction due to internal right knee prosthesis, subsequent encounter: Secondary | ICD-10-CM

## 2018-10-23 MED ORDER — SPIRONOLACTONE 25 MG PO TABS: 25.0000 mg | ORAL_TABLET | Freq: Every day | ORAL | 0 refills | Status: DC

## 2018-10-23 NOTE — Telephone Encounter (Signed)
Called [facility] to have labs drawn TSH, free T4, and CMET. Spoke to [name] and she states patient was closed out at the end of October.  Will place orders and call patient so he can come in to have lab work drawn.  Spoke with patient and he has to have lab work drawn Monday at Dr. Harvie Bridge office.  Called Dr. Harvie Bridge office and they states they will do the lab work for [name].  Sent patient a Mychart message to let him know. Angeline Slim RN

## 2018-10-23 NOTE — Telephone Encounter (Signed)
Called and spoke to patient and made him aware of Dr. Harvie Bridge recommendation to stop lasix and start aldactone 25 mg QD. Patient has not taken his lasix x 1 week. He also stopped his potassium as well. Patient is coming in for labs on Monday for recent med change at [facility] on 10/21. Patient states that his ID MD has also ordered labs to be done at this time. Patient will return on 11/22 for repeat BMET after starting the aldactone. Patient verbalized understanding and was in agreement with this plan. Rx sent to preferred pharmacy.

## 2018-10-23 NOTE — Telephone Encounter (Signed)
-----   Message from Blanchard Kelch, NP sent at 10/23/2018  9:56 AM EST ----- Regarding: Add Thyroid labs  Can you please call [facility] to add on a TSH and free T4 level. Would also ask to make sure they are checking liver function too (CMET) every week.  Thank you!

## 2018-10-26 ENCOUNTER — Other Ambulatory Visit: Payer: BLUE CROSS/BLUE SHIELD | Admitting: *Deleted

## 2018-10-26 DIAGNOSIS — T8453XD Infection and inflammatory reaction due to internal right knee prosthesis, subsequent encounter: Secondary | ICD-10-CM

## 2018-10-26 DIAGNOSIS — I10 Essential (primary) hypertension: Secondary | ICD-10-CM | POA: Diagnosis not present

## 2018-10-26 DIAGNOSIS — E039 Hypothyroidism, unspecified: Secondary | ICD-10-CM

## 2018-10-26 DIAGNOSIS — I5032 Chronic diastolic (congestive) heart failure: Secondary | ICD-10-CM

## 2018-10-26 LAB — BASIC METABOLIC PANEL
BUN / CREAT RATIO: 10 (ref 9–20)
BUN: 8 mg/dL (ref 6–24)
CALCIUM: 8.7 mg/dL (ref 8.7–10.2)
CHLORIDE: 102 mmol/L (ref 96–106)
CO2: 23 mmol/L (ref 20–29)
Creatinine, Ser: 0.77 mg/dL (ref 0.76–1.27)
GFR calc non Af Amer: 105 mL/min/{1.73_m2} (ref >59)
GFR, EST AFRICAN AMERICAN: 121 mL/min/{1.73_m2} (ref >59)
GLUCOSE: 108 mg/dL — AB (ref 65–99)
POTASSIUM: 3.8 mmol/L (ref 3.5–5.2)
Sodium: 139 mmol/L (ref 134–144)

## 2018-10-27 ENCOUNTER — Other Ambulatory Visit: Payer: BLUE CROSS/BLUE SHIELD

## 2018-11-05 DIAGNOSIS — T8459XD Infection and inflammatory reaction due to other internal joint prosthesis, subsequent encounter: Secondary | ICD-10-CM | POA: Diagnosis not present

## 2018-11-06 ENCOUNTER — Other Ambulatory Visit: Payer: BLUE CROSS/BLUE SHIELD | Admitting: *Deleted

## 2018-11-06 DIAGNOSIS — I5032 Chronic diastolic (congestive) heart failure: Secondary | ICD-10-CM

## 2018-11-06 DIAGNOSIS — I10 Essential (primary) hypertension: Secondary | ICD-10-CM | POA: Diagnosis not present

## 2018-11-06 LAB — BASIC METABOLIC PANEL
BUN / CREAT RATIO: 8 — AB (ref 9–20)
BUN: 6 mg/dL (ref 6–24)
CALCIUM: 9.2 mg/dL (ref 8.7–10.2)
CHLORIDE: 101 mmol/L (ref 96–106)
CO2: 23 mmol/L (ref 20–29)
Creatinine, Ser: 0.77 mg/dL (ref 0.76–1.27)
GFR calc Af Amer: 121 mL/min/{1.73_m2} (ref >59)
GFR calc non Af Amer: 105 mL/min/{1.73_m2} (ref >59)
GLUCOSE: 115 mg/dL — AB (ref 65–99)
Potassium: 4.3 mmol/L (ref 3.5–5.2)
Sodium: 139 mmol/L (ref 134–144)

## 2018-11-09 ENCOUNTER — Telehealth: Payer: Self-pay | Admitting: *Deleted

## 2018-11-09 NOTE — Telephone Encounter (Signed)
DPR ok to lmom, Lmom kiney function and K+ stable, glucose level mildly elevated. Continue on current Tx plan. If any questions please call [phone]

## 2018-11-11 ENCOUNTER — Ambulatory Visit (HOSPITAL_COMMUNITY)
Admission: RE | Admit: 2018-11-11 | Discharge: 2018-11-11 | Disposition: A | Discharge status: Home / Self Care | Payer: BLUE CROSS/BLUE SHIELD | Source: Ambulatory Visit | Attending: Orthopedic Surgery | Admitting: Orthopedic Surgery

## 2018-11-11 ENCOUNTER — Other Ambulatory Visit (HOSPITAL_COMMUNITY): Payer: Self-pay | Admitting: Orthopedic Surgery

## 2018-11-11 DIAGNOSIS — M79604 Pain in right leg: Secondary | ICD-10-CM | POA: Insufficient documentation

## 2018-11-11 DIAGNOSIS — M7989 Other specified soft tissue disorders: Secondary | ICD-10-CM | POA: Diagnosis not present

## 2018-11-11 DIAGNOSIS — T8459XD Infection and inflammatory reaction due to other internal joint prosthesis, subsequent encounter: Secondary | ICD-10-CM | POA: Diagnosis not present

## 2018-11-11 NOTE — Progress Notes (Signed)
Preliminary notes--Right lower extremity venous duplex exam completed. No evidence for deep veins thrombosis. However, the acute superficial vein thrombosis involving right great saphenous vein from distal thigh to mid calf region. Result called Dr. Sherene Sires PA [name], and patient was instructed to go to her office now.  Bryan [name] H Nastacia Raybuck(RDMS RVT) 11/11/18 2:42 PM

## 2018-11-15 ENCOUNTER — Other Ambulatory Visit: Payer: Self-pay | Admitting: Cardiovascular Disease

## 2018-11-16 ENCOUNTER — Telehealth: Payer: Self-pay | Admitting: Cardiovascular Disease

## 2018-11-16 NOTE — Telephone Encounter (Signed)
Doctor Jashay Roddy I developed leg pain just below my knee Friday 11/22. The pain did not go away and I notified Doctor [facility] office on Tues 11/26. Kirsten (PA) had a doppler done at [facility] on Wednesday. They found a superficial vein clot from my thigh to just above my knee. Kirsten prescribed eliquis for me and advised I consult my cardiologist for further treatment.     I talked to [place] by phone today. He has been started on Eliquis 10 mg twice a day for 1 week. This prescription was fairly expensive.  He will check with his insurance company to if they prefer Xarelto versus Eliquis.  States with Eliquis we will decrease the dose to 5 mg twice a day after this Wednesday.  If he switches to Xarelto we will change him to 20 mg once a day.  Early symptomatic with this superficial venous thrombosis.  I suggest a full 6 months of therapy.    Kristeen Miss, MD  11/16/2018 9:28 AM    [facility] [address] [place],  [address] [place], [place]  [address] Pager [phone] Phone: [phone]; Fax: [phone]

## 2018-11-17 ENCOUNTER — Telehealth: Payer: Self-pay | Admitting: Cardiovascular Disease

## 2018-11-17 ENCOUNTER — Other Ambulatory Visit: Payer: Self-pay | Admitting: *Deleted

## 2018-11-17 MED ORDER — APIXABAN 5 MG PO TABS: 5.0000 mg | ORAL_TABLET | Freq: Two times a day (BID) | ORAL | 3 refills | Status: DC

## 2018-11-17 NOTE — Telephone Encounter (Signed)
I've talked to Dr. Cyndie Chime  It is probably not necessary to determin whether or not Bryan [name] has Factor V Leiden deficiency   This was a provokable venous thrombosis   Treat with Eliquis for 3 months   Looks like Eliquis is going to be a better choice than Xarelto  Please send in script for Eliquis 5 mg PO BID  Will need pre-auth.   Please see if we have a 1-2 weeks of samples of Eliquis until we are able to get the scropt sorted out . He runs out of her Xarelto today or tomorrow   There is some discussion about interaction between eliquis and Rifampin  Discussed this with Dr. Cyndie Chime also He thinks the interaction will be minimal Ive asked our pharmacists to comment on this also     Bryan Miss, MD  11/17/2018 2:24 PM    [facility] [address]  [address] [place], [place]  [address] Pager [phone] Phone: [phone]; Fax: [phone]

## 2018-11-18 ENCOUNTER — Telehealth: Payer: Self-pay

## 2018-11-18 ENCOUNTER — Telehealth: Payer: Self-pay | Admitting: Pharmacist

## 2018-11-18 MED ORDER — ENOXAPARIN SODIUM 150 MG/ML ~~LOC~~ SOLN: 150.0000 mg | Freq: Two times a day (BID) | SUBCUTANEOUS | 2 refills | Status: DC

## 2018-11-18 MED ORDER — WARFARIN SODIUM 10 MG PO TABS: ORAL_TABLET | ORAL | 0 refills | Status: DC

## 2018-11-18 NOTE — Telephone Encounter (Signed)
**Note De-Identified Marcquis Ridlon Obfuscation** We received a PA request from [facility] pharmacy for the pts Eliquis.  Per Dr Harvie Bridge nurse the pt will not be starting Eliquis due to interaction with Rifampin. I have called [facility] and asked them to remove Eliquis from the pts med list.

## 2018-11-18 NOTE — Telephone Encounter (Signed)
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 Pt's wife is RN at [employer] and can help pt with Lovenox injections, both are well versed with warfarin already.  Will start warfarin 15mg  daily and Lovenox 150mg  Q12H (1mg /kg BID treatment dose for DVT) and will stop Lovenox once INR 2-3. Anticipate pt will need very high doses of warfarin due to concomitant rifampin therapy. Check INR in clinic on Monday.

## 2018-11-23 ENCOUNTER — Ambulatory Visit: Payer: BLUE CROSS/BLUE SHIELD | Admitting: Infectious Diseases

## 2018-11-23 ENCOUNTER — Encounter: Payer: Self-pay | Admitting: Infectious Diseases

## 2018-11-23 ENCOUNTER — Ambulatory Visit (INDEPENDENT_AMBULATORY_CARE_PROVIDER_SITE_OTHER): Payer: BLUE CROSS/BLUE SHIELD | Admitting: Pharmacist

## 2018-11-23 VITALS — BP 164/87 | HR 73 | Temp 98.0°F | Ht 74.0 in | Wt 327.0 lb

## 2018-11-23 DIAGNOSIS — B9561 Methicillin susceptible Staphylococcus aureus infection as the cause of diseases classified elsewhere: Secondary | ICD-10-CM | POA: Diagnosis not present

## 2018-11-23 DIAGNOSIS — M00061 Staphylococcal arthritis, right knee: Secondary | ICD-10-CM | POA: Diagnosis not present

## 2018-11-23 DIAGNOSIS — Z888 Allergy status to other drugs, medicaments and biological substances status: Secondary | ICD-10-CM

## 2018-11-23 DIAGNOSIS — Z7901 Long term (current) use of anticoagulants: Secondary | ICD-10-CM

## 2018-11-23 DIAGNOSIS — Z792 Long term (current) use of antibiotics: Secondary | ICD-10-CM

## 2018-11-23 DIAGNOSIS — I808 Phlebitis and thrombophlebitis of other sites: Secondary | ICD-10-CM | POA: Diagnosis not present

## 2018-11-23 DIAGNOSIS — Z5181 Encounter for therapeutic drug level monitoring: Secondary | ICD-10-CM | POA: Diagnosis not present

## 2018-11-23 DIAGNOSIS — Z880 Allergy status to penicillin: Secondary | ICD-10-CM

## 2018-11-23 DIAGNOSIS — T8453XD Infection and inflammatory reaction due to internal right knee prosthesis, subsequent encounter: Secondary | ICD-10-CM

## 2018-11-23 DIAGNOSIS — I10 Essential (primary) hypertension: Secondary | ICD-10-CM

## 2018-11-23 DIAGNOSIS — Z96653 Presence of artificial knee joint, bilateral: Secondary | ICD-10-CM

## 2018-11-23 DIAGNOSIS — Z79890 Hormone replacement therapy (postmenopausal): Secondary | ICD-10-CM

## 2018-11-23 LAB — POCT INR: INR: 1.4 — AB (ref 2.0–3.0)

## 2018-11-23 NOTE — Progress Notes (Signed)
Patient: Bryan [name]  DOB: [birth year] MRN: [id number] PCP: Maury Dus, MD  Referring Provider: Hospital Follow Up   Patient Active Problem List   Diagnosis Date Noted  . Encounter for therapeutic drug monitoring 11/23/2018  . Medication monitoring encounter 09/28/2018  . OSA on CPAP   . Edema   . 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 S/P total knee arthroplasty, right 03/25/2017  . Hyperlipidemia 02/14/2017  . Family history of heart disease 02/14/2017  . Obstructive sleep apnea   . Hypothyroidism   . Hypertension   . Vitamin D deficiency      Subjective:  CC:  Bryan [name] is a 52 y.o. man here today for follow up on his prosthetic knee joint infection. Newly discovered superficial thrombophlebitis d/t clot - started on warfarin.   Brief ID Narrative:  He underwent bilateral total knee replacements (R on 02/26/17 and L on 04/07/17). He developed cellulitis of the R ankle after exposure to many insect bites that caused intense itching. He began having redness, swelling and fevers/chills/night sweats shortly after. He presented to surgeon's office and underwent R knee arthrocentesis revealing > 100,000 white cells c/w PJI. He underwent polyethylene liner exchange + debridement & irrigation of what apparently was extensive synoviits. Dr. Archie Endo Op Note 08/30/18 - excessive amt of purulent fluid in the joint; femoral, tibial and patellar components were well fixed to the bone. Significant synovitis in the knee. Synovial fluid from aspiration and intraoperative cultures all revealed MSSA; there was mention of GNRs on a gram stain from tissue however nothing grew out on culture aside from staph. He was discharged home on IV Cefazolin 2g Q8h. Due to apixaban and levothyroxine interactions it was decided to avoid addition of rifampin at first however his NOAC was d/c'd and we added Rifampin at our last office visit when we transitioned to PO cephalexin  therapy to continue his PJI treatment course.   HPI/RPS:  He has been doing well outside of recent development of superficial blood clot involving the right upper/medial calf. He will require 6 months of anticoagulation therapy per Dr. Julious Payer recommendation. He has been started on warfarin with temporary lovenox bridge due to DDI with rifampin therapy and DOACs. He has tolerated both the cephalexin and rifampin very well without any nausea, abdominal pain, nausea or diarrhea. He tells me he has had some knee pain but more due to trying to resume exercise with bike. He is concerned about his blood pressure today being so high and it apparently continues to increase. Denies any fevers, chills or sweats.   Review of Systems  Constitutional: Negative for chills, fever, malaise/fatigue and weight loss.  HENT: Negative for sore throat.   Respiratory: Negative for cough and sputum production.   Cardiovascular: Negative for chest pain and leg swelling.  Gastrointestinal: Negative for abdominal pain, diarrhea and vomiting.  Genitourinary: Negative for dysuria and flank pain.  Musculoskeletal: Positive for joint pain (R knee and R medial upper calf ). Negative for myalgias and neck pain.  Skin: Negative for rash.  Neurological: Negative for dizziness, tingling and headaches.  Psychiatric/Behavioral: Negative for depression and substance abuse. The patient is not nervous/anxious and does not have insomnia.     Past Medical History:  Diagnosis Date  . Allergy    takes Claritin daily  . Elevated cholesterol    takes Crestor daily  . Enlarged prostate    takes Flomax nightly  . GERD (gastroesophageal reflux disease)  takes Zantac daily  . Hypertension    takes Lisinopril and Bystolic daily  . Hypothyroidism    takes Synthroid daily  . Medical history non-contributory   . Neuromuscular disorder (HCC)    bells palsy  . OSA (obstructive sleep apnea)    uses CPAP  . Primary localized  osteoarthritis of left knee 03/25/2017  . Primary localized osteoarthritis of right knee   . S/P total knee arthroplasty, right 03/25/2017  . Vitamin D deficiency     Outpatient Medications Prior to Visit  Medication Sig Dispense Refill  . acetaminophen (TYLENOL) 500 MG tablet Take 500-1,000 mg by mouth every 6 (six) hours as needed (for pain.).    Marland Kitchen amLODipine (NORVASC) 5 MG tablet Take 5 mg by mouth daily.  2  . clobetasol ointment (TEMOVATE) 1.88 % Apply 1 application topically daily as needed (to affected areas for psoriasis).     . enoxaparin (LOVENOX) 150 MG/ML injection Inject 1 mL (150 mg total) into the skin every 12 (twelve) hours. 20 Syringe 2  . levothyroxine (SYNTHROID, LEVOTHROID) 300 MCG tablet Take 300 mcg by mouth daily before breakfast.     . Nebivolol HCl 20 MG TABS Take 1 tablet (20 mg total) by mouth daily. 90 tablet 3  . olmesartan (BENICAR) 40 MG tablet Take 1 tablet (40 mg total) by mouth daily. 90 tablet 3  . ranitidine (ZANTAC) 75 MG tablet Take 75-150 mg by mouth See admin instructions. Take 75 mg by mouth in the morning then 75 mg in the afternoon then 150 mg at bedtime    . rifampin (RIFADIN) 300 MG capsule Take 1 capsule (300 mg total) by mouth 2 (two) times daily. 60 capsule 6  . rosuvastatin (CRESTOR) 20 MG tablet Take 20 mg by mouth at bedtime.     Marland Kitchen spironolactone (ALDACTONE) 25 MG tablet TAKE 1 TABLET BY MOUTH EVERY DAY 30 tablet 10  . tamsulosin (FLOMAX) 0.4 MG CAPS capsule Take 0.4 mg by mouth at bedtime.    Marland Kitchen warfarin (COUMADIN) 10 MG tablet Take as directed by Coumadin clinic 45 tablet 0   No facility-administered medications prior to visit.      Allergies  Allergen Reactions  . Lipitor [Atorvastatin Calcium] Other (See Comments)    Muscle aches  . Penicillins Hives and Swelling    PATIENT HAS HAD KEFLEX WITH NO REACTION Swells "all over" Has patient had a PCN reaction causing immediate rash, facial/tongue/throat swelling, SOB or lightheadedness  with hypotension: Yes Has patient had a PCN reaction causing severe rash involving mucus membranes or skin necrosis: Unk Has patient had a PCN reaction that required hospitalization: No, but went to Urgent Care Has patient had a PCN reaction occurring within the last 10 years: No If all of the above answers are "NO", then may proceed    Social History   Tobacco Use  . Smoking status: Never Smoker  . Smokeless tobacco: Former Network engineer Use Topics  . Alcohol use: Yes    Alcohol/week: 3.0 standard drinks    Types: 3 Cans of beer per week    Comment: daily  . Drug use: No     Objective:   Vitals:   11/23/18 1558  BP: (!) 164/87  Pulse: 73  Temp: 98 F (36.7 C)  Weight: (!) 327 lb (148.3 kg)  Height: '6\' 2"'  (1.88 m)   Body mass index is 41.98 kg/m.  Physical Exam  Constitutional: He is oriented to person, place, and time. He appears  well-developed and well-nourished.  Seated comfortably in chair during visit. Appears well today.   HENT:  Mouth/Throat: Mucous membranes are normal. Normal dentition. No dental abscesses.  Cardiovascular: Normal rate, regular rhythm and normal heart sounds.  No murmur heard. Pulmonary/Chest: Effort normal and breath sounds normal.  Abdominal: Soft. He exhibits no distension. There is no tenderness.  Musculoskeletal:  R knee incision well healed.   Neurological: He is alert and oriented to person, place, and time.  Skin: Skin is warm and dry. No rash noted.  Psychiatric: He has a normal mood and affect. Judgment normal.  Vitals reviewed.  Lab Results: Lab Results  Component Value Date   WBC 4.6 11/23/2018   HGB 15.0 11/23/2018   HCT 44.7 11/23/2018   MCV 87.3 11/23/2018   PLT 272 11/23/2018    Lab Results  Component Value Date   CREATININE 0.82 11/23/2018   BUN 6 (L) 11/23/2018   NA 141 11/23/2018   K 3.7 11/23/2018   CL 103 11/23/2018   CO2 29 11/23/2018    Lab Results  Component Value Date   ALT 66 (H) 11/23/2018    AST 49 (H) 11/23/2018   ALKPHOS 66 08/29/2018   BILITOT 0.3 11/23/2018     Assessment & Plan:   Problem List Items Addressed This Visit      Unprioritized   Hypertension    There is potential to increase metabolism of amlodipine and nebivolol however his blood pressures have been elevated prior to starting therapy. Will forward note to cardiologist per his request for adjustment of regimen.   BP Readings from Last 3 Encounters:  11/23/18 (!) 164/87  10/05/18 (!) 150/94  09/29/18 (!) 174/101         Infection of total right knee replacement ([place]) - Primary    Merry Proud is doing well. He is nearly 12 weeks into therapy with planned course for 6 months dual treatment. Plan will be to continue both cephalexin 500 TID and rifampin BID through March 16th 2020. Will follow labs for medication/infection monitoring and have him return again in 6 weeks or sooner if labs warrant.       Relevant Orders   C-reactive protein (Completed)   Sedimentation rate (Completed)   Medication monitoring encounter    Will check CMET with rifampin therapy, CRP/ESR to monitor treatment of PJI and CBC. Will check thyroid panel as well and have him forward results to his PCP for adjustment if needed as the RIF can increase metabolism of synthroid.       Relevant Orders   Thyroid Panel With TSH (Completed)   CBC (Completed)   Comprehensive metabolic panel (Completed)     No orders of the defined types were placed in this encounter.  Return in about 6 weeks (around 01/04/2019).   Janene Madeira, MSN, NP-C [facility] for Infectious [place] Pager: [phone] Office: [phone]  11/24/18  10:05 PM

## 2018-11-23 NOTE — Patient Instructions (Signed)
Description   Continue lovenox 150mg  every 12 hours. Take 2 tablets today and tomorrow then continue taking 1.5 tablets daily except 20mg  on Fridays. Recheck INR on Friday. [phone]

## 2018-11-23 NOTE — Patient Instructions (Addendum)
Continue taking your antibiotics (both the keflex -3 times a day and the rifampin 2 times a day)  Will check some labs today and then talk again via MyChart  Would give Dr. Melburn Popper a call about your blood pressure (I will send him a note too)   Would like to see you back 6 weeks.

## 2018-11-24 LAB — CBC
HEMATOCRIT: 44.7 % (ref 38.5–50.0)
HEMOGLOBIN: 15 g/dL (ref 13.2–17.1)
MCH: 29.3 pg (ref 27.0–33.0)
MCHC: 33.6 g/dL (ref 32.0–36.0)
MCV: 87.3 fL (ref 80.0–100.0)
MPV: 9.9 fL (ref 7.5–12.5)
Platelets: 272 10*3/uL (ref 140–400)
RBC: 5.12 10*6/uL (ref 4.20–5.80)
RDW: 14.5 % (ref 11.0–15.0)
WBC: 4.6 10*3/uL (ref 3.8–10.8)

## 2018-11-24 LAB — COMPREHENSIVE METABOLIC PANEL
AG Ratio: 1.7 (calc) (ref 1.0–2.5)
ALT: 66 U/L — ABNORMAL HIGH (ref 9–46)
AST: 49 U/L — ABNORMAL HIGH (ref 10–35)
Albumin: 4 g/dL (ref 3.6–5.1)
Alkaline phosphatase (APISO): 75 U/L (ref 40–115)
BUN / CREAT RATIO: 7 (calc) (ref 6–22)
BUN: 6 mg/dL — ABNORMAL LOW (ref 7–25)
CO2: 29 mmol/L (ref 20–32)
Calcium: 9.2 mg/dL (ref 8.6–10.3)
Chloride: 103 mmol/L (ref 98–110)
Creat: 0.82 mg/dL (ref 0.70–1.33)
GLUCOSE: 112 mg/dL — AB (ref 65–99)
Globulin: 2.4 g/dL (calc) (ref 1.9–3.7)
Potassium: 3.7 mmol/L (ref 3.5–5.3)
Sodium: 141 mmol/L (ref 135–146)
Total Bilirubin: 0.3 mg/dL (ref 0.2–1.2)
Total Protein: 6.4 g/dL (ref 6.1–8.1)

## 2018-11-24 LAB — C-REACTIVE PROTEIN: CRP: 3.8 mg/L (ref <8.0)

## 2018-11-24 LAB — SEDIMENTATION RATE: SED RATE: 6 mm/h (ref 0–20)

## 2018-11-24 LAB — THYROID PANEL WITH TSH
Free Thyroxine Index: 2.7 (ref 1.4–3.8)
T3 Uptake: 27 % (ref 22–35)
T4, Total: 10 ug/dL (ref 4.9–10.5)
TSH: 7.25 mIU/L — ABNORMAL HIGH (ref 0.40–4.50)

## 2018-11-24 NOTE — Assessment & Plan Note (Signed)
Will check CMET with rifampin therapy, CRP/ESR to monitor treatment of PJI and CBC. Will check thyroid panel as well and have him forward results to his PCP for adjustment if needed as the RIF can increase metabolism of synthroid.

## 2018-11-24 NOTE — Assessment & Plan Note (Signed)
Bryan [name] is doing well. He is nearly 12 weeks into therapy with planned course for 6 months dual treatment. Plan will be to continue both cephalexin 500 TID and rifampin BID through March 16th 2020. Will follow labs for medication/infection monitoring and have him return again in 6 weeks or sooner if labs warrant.

## 2018-11-24 NOTE — Assessment & Plan Note (Signed)
There is potential to increase metabolism of amlodipine and nebivolol however his blood pressures have been elevated prior to starting therapy. Will forward note to cardiologist per his request for adjustment of regimen.   BP Readings from Last 3 Encounters:  11/23/18 (!) 164/87  10/05/18 (!) 150/94  09/29/18 (!) 174/101

## 2018-11-25 NOTE — Progress Notes (Signed)
TSH up 7.25 d/t decreased effect of synthroid in setting of short term rifampin use - will forward results as he requests to managing physician to determine need to increase synthroid dose for short term. The plan will be to continue rifampin therapy through March to manage his staph aureus prosthetic knee joint infection.

## 2018-11-26 ENCOUNTER — Telehealth: Payer: Self-pay | Admitting: Nurse Practitioner

## 2018-11-26 MED ORDER — AMLODIPINE BESYLATE 10 MG PO TABS: 10.0000 mg | ORAL_TABLET | Freq: Every day | ORAL | 3 refills | Status: AC

## 2018-11-26 NOTE — Telephone Encounter (Signed)
I called Bryan [name] to discuss med changes   Bryan [name] is already on bystolic 20 mg a day We have increased amlodipine to 10 mg a day  He will continue to monitor his BP  We discussed the importance of salt restriction  Will see him in a month or so

## 2018-11-26 NOTE — Telephone Encounter (Signed)
Patient is already on Bystolic 20 mg QD Rx for Amlodipine 10 mg sent to patient's pharmacy and encounter routed to Dr. Elease Hashimoto who will call the patient

## 2018-11-26 NOTE — Telephone Encounter (Signed)
-----   Message from Vesta Mixer, MD sent at 11/26/2018  4:16 PM EST ----- Bryan [name] continues to have elevated BP Lets add Bystolic 5 mg a day and increase amlodipine to 10 mg a day and see him back in a month or so  Thank you    ----- Message ----- From: Blanchard Kelch, NP Sent: 11/24/2018  10:05 PM EST To: Vesta Mixer, MD  Hi Dr. Elease Hashimoto, Bryan [name] requested I forward my note to you. His blood pressure has been pretty elevated the last few visits. It could partially be blamed on the rifampin therapy however they were elevated before adding this.  He will be calling your office. His leg pain has improved significantly since starting his warfarin and lovenox.  Thank you,  Rexene Alberts, NP

## 2018-11-27 ENCOUNTER — Telehealth: Payer: Self-pay | Admitting: Cardiovascular Disease

## 2018-11-27 ENCOUNTER — Ambulatory Visit (INDEPENDENT_AMBULATORY_CARE_PROVIDER_SITE_OTHER): Payer: BLUE CROSS/BLUE SHIELD | Admitting: *Deleted

## 2018-11-27 DIAGNOSIS — Z5181 Encounter for therapeutic drug level monitoring: Secondary | ICD-10-CM

## 2018-11-27 LAB — POCT INR: INR: 1.3 — AB (ref 2.0–3.0)

## 2018-11-27 MED ORDER — WARFARIN SODIUM 10 MG PO TABS: ORAL_TABLET | ORAL | 1 refills | Status: DC

## 2018-11-27 NOTE — Patient Instructions (Signed)
Description   Start taking 2 tablets daily except 2.5 tablets on Mondays and Fridays. Continue taking Lovenox injections 150mg  into fatty abdominal tissue every 12 hours, rotate sites. Recheck on Wednesday.  [phone]

## 2018-11-27 NOTE — Telephone Encounter (Signed)
Returned call to pharmacy, verified with Morrie Sheldon pharmacist pt is on 25mg  of Coumadin at present some days, but INR is not therapeutic and we are presently titrating up his dosage secondary to Rifampin.

## 2018-11-27 NOTE — Telephone Encounter (Signed)
Pharmacy is calling as they need to clarify the prescription they received for warfarin (COUMADIN) 10 MG tablet.  They state is written take as directed which would mean 30 mg a day which is high. So they would need that documented.

## 2018-12-01 ENCOUNTER — Other Ambulatory Visit: Payer: Self-pay | Admitting: Cardiovascular Disease

## 2018-12-02 ENCOUNTER — Ambulatory Visit (INDEPENDENT_AMBULATORY_CARE_PROVIDER_SITE_OTHER): Payer: BLUE CROSS/BLUE SHIELD | Admitting: Pharmacist

## 2018-12-02 DIAGNOSIS — Z5181 Encounter for therapeutic drug level monitoring: Secondary | ICD-10-CM

## 2018-12-02 LAB — POCT INR: INR: 1.3 — AB (ref 2.0–3.0)

## 2018-12-02 NOTE — Patient Instructions (Signed)
Description   Start taking 3 tablets daily except 2.5 tablets on Mondays and Fridays. Continue taking Lovenox injections 150mg  into fatty abdominal tissue every 12 hours, rotate sites. Recheck in 1 week. [phone]

## 2018-12-10 ENCOUNTER — Ambulatory Visit (INDEPENDENT_AMBULATORY_CARE_PROVIDER_SITE_OTHER): Payer: BLUE CROSS/BLUE SHIELD | Admitting: *Deleted

## 2018-12-10 DIAGNOSIS — Z5181 Encounter for therapeutic drug level monitoring: Secondary | ICD-10-CM

## 2018-12-10 LAB — POCT INR: INR: 1.5 — AB (ref 2.0–3.0)

## 2018-12-10 MED ORDER — WARFARIN SODIUM 10 MG PO TABS: ORAL_TABLET | ORAL | 1 refills | Status: DC

## 2018-12-10 NOTE — Patient Instructions (Signed)
Description   Start taking 4 tablets daily except 3 tablets on Sundays, Tuesdays and Thursdays.  Continue taking Lovenox injections 150mg  into fatty abdominal tissue every 12 hours, rotate sites. Recheck in 1 week. [phone]

## 2018-12-14 ENCOUNTER — Other Ambulatory Visit (HOSPITAL_COMMUNITY): Payer: Self-pay | Admitting: Orthopedic Surgery

## 2018-12-14 ENCOUNTER — Ambulatory Visit (HOSPITAL_COMMUNITY)
Admission: RE | Admit: 2018-12-14 | Discharge: 2018-12-14 | Disposition: A | Discharge status: Home / Self Care | Payer: BLUE CROSS/BLUE SHIELD | Source: Ambulatory Visit | Attending: Orthopedic Surgery | Admitting: Orthopedic Surgery

## 2018-12-14 DIAGNOSIS — T8459XD Infection and inflammatory reaction due to other internal joint prosthesis, subsequent encounter: Secondary | ICD-10-CM | POA: Diagnosis not present

## 2018-12-14 DIAGNOSIS — M7989 Other specified soft tissue disorders: Secondary | ICD-10-CM | POA: Diagnosis not present

## 2018-12-14 DIAGNOSIS — M79604 Pain in right leg: Secondary | ICD-10-CM

## 2018-12-14 NOTE — Progress Notes (Signed)
Right lower extremity venous duplex has been completed. Negative for DVT. Findings consistent with age indeterminate superficial vein thrombosis involving the right great saphenous vein. Results were given to Genelle Bal PA.  12/14/18 3:39 PM Olen Cordial RVT

## 2018-12-15 ENCOUNTER — Encounter: Payer: Self-pay | Admitting: Cardiovascular Disease

## 2018-12-17 ENCOUNTER — Ambulatory Visit (INDEPENDENT_AMBULATORY_CARE_PROVIDER_SITE_OTHER): Payer: BLUE CROSS/BLUE SHIELD | Admitting: Pharmacist

## 2018-12-17 DIAGNOSIS — Z5181 Encounter for therapeutic drug level monitoring: Secondary | ICD-10-CM

## 2018-12-17 LAB — POCT INR: INR: 1.8 — AB (ref 2.0–3.0)

## 2018-12-17 MED ORDER — ENOXAPARIN SODIUM 150 MG/ML ~~LOC~~ SOLN: 150.0000 mg | Freq: Two times a day (BID) | SUBCUTANEOUS | 3 refills | Status: DC

## 2018-12-17 MED ORDER — WARFARIN SODIUM 10 MG PO TABS: ORAL_TABLET | ORAL | 1 refills | Status: DC

## 2018-12-17 NOTE — Patient Instructions (Signed)
Description   Start taking 4 tablets daily.  Continue taking Lovenox injections 150mg  into fatty abdominal tissue every 12 hours, rotate sites. Recheck in 1 week. [phone]

## 2018-12-22 DIAGNOSIS — S8001XA Contusion of right knee, initial encounter: Secondary | ICD-10-CM | POA: Diagnosis not present

## 2018-12-23 ENCOUNTER — Ambulatory Visit (INDEPENDENT_AMBULATORY_CARE_PROVIDER_SITE_OTHER): Payer: BLUE CROSS/BLUE SHIELD | Admitting: *Deleted

## 2018-12-23 DIAGNOSIS — Z5181 Encounter for therapeutic drug level monitoring: Secondary | ICD-10-CM | POA: Diagnosis not present

## 2018-12-23 LAB — POCT INR: INR: 4.2 — AB (ref 2.0–3.0)

## 2018-12-23 NOTE — Patient Instructions (Signed)
Description   Discontinue Lovenox injections. Skip Coumadin dose today, then start taking 4 tablets everyday except taking 3 tablets on Mondays and Fridays.  Recheck in 1 week. [phone]

## 2018-12-26 DIAGNOSIS — G4733 Obstructive sleep apnea (adult) (pediatric): Secondary | ICD-10-CM | POA: Diagnosis not present

## 2018-12-27 DIAGNOSIS — R42 Dizziness and giddiness: Secondary | ICD-10-CM | POA: Diagnosis not present

## 2018-12-27 DIAGNOSIS — T45515A Adverse effect of anticoagulants, initial encounter: Secondary | ICD-10-CM | POA: Diagnosis not present

## 2018-12-27 DIAGNOSIS — R319 Hematuria, unspecified: Secondary | ICD-10-CM | POA: Diagnosis not present

## 2018-12-28 NOTE — Telephone Encounter (Signed)
Megan Supple ( PHarm D) talked to Bryan [name] about his bleeding in his urine and his knee I agree with Aundra Millet that the bleeding in his urine is likely due to the INR of 4.2 and with Lovenox. We had discussed stopping the Lovenox and holding the coumadin for a day to see if that would help.  According to [facility], Dr. Thurston Hole thinks there is continued bleeding into his recently re-injurred knee and the he should hold his coumadin for ? A week or 2.    The venous thrombosis is in the SFA which has a lower likelyhood of embolizing.   He was fairly symptomatic with the SVT which is why we elected to treat with 6 month of anticoagulation .   He has an infection of his prothesis and is on rifampin - so DOACs are contraindicated.    I will defer the decision to hold coumadin to ortho.     I would like for Bryan [name] to resume coumadin as soon as ortho gives the OK and continue for a total of 6 months .       Kristeen Miss, MD  12/28/2018 10:34 AM    [facility] [address] [place],  [address] [place], [place]  [address] Pager [phone] Phone: [phone]; Fax: [phone]

## 2019-01-07 DIAGNOSIS — S8001XD Contusion of right knee, subsequent encounter: Secondary | ICD-10-CM | POA: Diagnosis not present

## 2019-01-11 ENCOUNTER — Encounter: Payer: Self-pay | Admitting: Cardiovascular Disease

## 2019-01-11 ENCOUNTER — Ambulatory Visit: Payer: BLUE CROSS/BLUE SHIELD | Admitting: Cardiovascular Disease

## 2019-01-11 VITALS — BP 140/76 | HR 93 | Ht 74.0 in | Wt 329.0 lb

## 2019-01-11 DIAGNOSIS — I5032 Chronic diastolic (congestive) heart failure: Secondary | ICD-10-CM | POA: Diagnosis not present

## 2019-01-11 DIAGNOSIS — I10 Essential (primary) hypertension: Secondary | ICD-10-CM

## 2019-01-11 NOTE — Patient Instructions (Signed)

## 2019-01-11 NOTE — Progress Notes (Signed)
Cardiology Office Note:    Date:  01/11/2019   ID:  MARSALIS GIULIANI, DOB [birth year], MRN [id number]  PCP:  Bryan Else, MD  Cardiologist:  Kristeen Miss, MD  Electrophysiologist:  None   Problem lIst 1. Hypertensive heart disease with diastolic CHF 2. Knee replacement with recent infection and redo  R knee replacemet / polyexchange ( replaced plastic parts )  3.  OSA 4. Hypothyroidism 5. Hyperlipidemia   Referring MD: Bryan Else, MD   Chief Complaint  Patient presents with  . Congestive Heart Failure     Bryan [name] is a 53 y.o. male with a hx of HTN for years  Bilateral knee replacement .      OSA  -  Uses CPAP every night   4 weeks ago, developed a small bug bit like infection in his right ankle  Infection spread,   Developed cellulitis Became very fatigued,  Fever , chills,  Right knee replacement became infected.   Had right knee surgery  Became hypoxic during the surgery  Developed flash pulmonary edema during surgery . Spent 2 days in ICU.  Did not  Get lasix  O2 sats stayed around 93-94%.   O2 sats have remained low.  sats go down if he is sleepy  No CP  Has some DOE - with exertion for the past  He thinks its due to his weight.  Has gained weight over the past several years ,  More weight gain in the last several years .    Oct. 21, 2019: breathing is better  O2 sats = 94% today .  Right knee is better ,  Has an infection is his right knee replacement  - is on Rifampin  Just started diclofenac 75 BID - really helps his knee pain   BP has been elevated.  Is not eating a low salt diet   January 11, 2019: Bryan [name] is seen for follow-up visit.    He has chronic diastolic CHF .  e is developed a superficial thrombophlebitis in his right leg.  We treated him for a while with Eliquis but he has stopped this at this point .  We had changed him to coumadin  Trying to avoid salty foods.   His knee is generally getting better. Wt today is 329 lbs.  , has been stable .        Past Medical History:  Diagnosis Date  . Allergy    takes Claritin daily  . Elevated cholesterol    takes Crestor daily  . Enlarged prostate    takes Flomax nightly  . GERD (gastroesophageal reflux disease)    takes Zantac daily  . Hypertension    takes Lisinopril and Bystolic daily  . Hypothyroidism    takes Synthroid daily  . Medical history non-contributory   . Neuromuscular disorder (HCC)    bells palsy  . OSA (obstructive sleep apnea)    uses CPAP  . Primary localized osteoarthritis of left knee 03/25/2017  . Primary localized osteoarthritis of right knee   . S/P total knee arthroplasty, right 03/25/2017  . Vitamin D deficiency     Past Surgical History:  Procedure Laterality Date  . I&D KNEE WITH POLY EXCHANGE Right 08/30/2018   Procedure: RIGHT TOTAL KNEE WASHOUT AND POLY EXCHANGE;  Surgeon: Salvatore Marvel, MD;  Location: [facility] OR;  Service: Orthopedics;  Laterality: Right;  . JOINT REPLACEMENT    . MENISCUS REPAIR Right 2017   torn meniscus  . NO PAST  SURGERIES    . TOTAL KNEE ARTHROPLASTY Right 02/26/2017   Procedure: TOTAL KNEE ARTHROPLASTY;  Surgeon: Salvatore Marvel, MD;  Location: [facility] OR;  Service: Orthopedics;  Laterality: Right;  . TOTAL KNEE ARTHROPLASTY Left 04/07/2017  . TOTAL KNEE ARTHROPLASTY Left 04/07/2017   Procedure: LEFT TOTAL KNEE ARTHROPLASTY;  Surgeon: Salvatore Marvel, MD;  Location: [facility] OR;  Service: Orthopedics;  Laterality: Left;    Current Medications: Current Meds  Medication Sig  . acetaminophen (TYLENOL) 500 MG tablet Take 500-1,000 mg by mouth every 6 (six) hours as needed (for pain.).  Marland Kitchen amLODipine (NORVASC) 10 MG tablet Take 1 tablet (10 mg total) by mouth daily.  . clobetasol ointment (TEMOVATE) 0.05 % Apply 1 application topically daily as needed (to affected areas for psoriasis).   Marland Kitchen levothyroxine (SYNTHROID, LEVOTHROID) 300 MCG tablet Take 300 mcg by mouth daily before breakfast.   . levothyroxine (SYNTHROID,  LEVOTHROID) 50 MCG tablet Take 50 mcg by mouth daily before breakfast.  . Nebivolol HCl 20 MG TABS Take 1 tablet (20 mg total) by mouth daily.  Marland Kitchen olmesartan (BENICAR) 40 MG tablet Take 1 tablet (40 mg total) by mouth daily.  . ranitidine (ZANTAC) 75 MG tablet Take 75-150 mg by mouth See admin instructions. Take 75 mg by mouth in the morning then 75 mg in the afternoon then 150 mg at bedtime  . rifampin (RIFADIN) 300 MG capsule Take 1 capsule (300 mg total) by mouth 2 (two) times daily.  . rosuvastatin (CRESTOR) 20 MG tablet Take 20 mg by mouth at bedtime.   Marland Kitchen spironolactone (ALDACTONE) 25 MG tablet TAKE 1 TABLET BY MOUTH EVERY DAY  . tamsulosin (FLOMAX) 0.4 MG CAPS capsule Take 0.4 mg by mouth at bedtime.     Allergies:   Lipitor [atorvastatin calcium] and Penicillins   Social History   Socioeconomic History  . Marital status: Married    Spouse name: Not on file  . Number of children: Not on file  . Years of education: Not on file  . Highest education level: Not on file  Occupational History  . 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 Financial resource strain: Not hard at all  . Food insecurity:    Worry: Never true    Inability: Never true  . Transportation needs:    Medical: No    Non-medical: No  Tobacco Use  . Smoking status: Never Smoker  . Smokeless tobacco: Former Engineer, water and Sexual Activity  . Alcohol use: Yes    Alcohol/week: 3.0 standard drinks    Types: 3 Cans of beer per week    Comment: daily  . Drug use: No  . Sexual activity: Yes  Lifestyle  . Physical activity:    Days per week: 2 days    Minutes per session: 40 min  . Stress: Not at all  Relationships  . Social connections:    Talks on phone: More than three times a week    Gets together: More than three times a week    Attends religious service: 1 to 4 times per year    Active member of club or organization: No    Attends meetings of clubs or  organizations: Never    Relationship status: Married  Other Topics Concern  . Not on file  Social History Narrative  . Not on file     Family History: The patient's family history includes Atrial fibrillation in his father; Deep  vein thrombosis in his mother; Heart attack in his father; Hypothyroidism in an other family member; Pulmonary embolism in an other family member; Rheum arthritis in his mother.  ROS:   Please see the history of present illness.     All other systems reviewed and are negative.  EKGs/Labs/Other Studies Reviewed:    The following studies were reviewed today:   EKG:     Recent Labs: 11/23/2018: ALT 66; BUN 6; Creat 0.82; Hemoglobin 15.0; Platelets 272; Potassium 3.7; Sodium 141; TSH 7.25  Recent Lipid Panel No results found for: CHOL, TRIG, HDL, CHOLHDL, VLDL, LDLCALC, LDLDIRECT  Physical Exam: Blood pressure 140/76, pulse 93, height 6\' 2"  (1.88 m), weight (!) 329 lb (149.2 kg), SpO2 97 %.  GEN:  Middle age,  Moderately obese male , NAD  HEENT: Normal NECK: No JVD; No carotid bruits LYMPHATICS: No lymphadenopathy CARDIAC: RRR   RESPIRATORY:  Clear to auscultation without rales, wheezing or rhonchi  ABDOMEN: Soft, non-tender, non-distended MUSCULOSKELETAL:  Right knee is swollen , not warm  SKIN: Warm and dry NEUROLOGIC:  Alert and oriented x 3   ASSESSMENT:    No diagnosis found. PLAN:    In order of problems listed above:  1.   Chronic diastolic CHF-seems to be feeling better.       is off the Lasix.   Is taking aldactone   2.   OSA :     3.   HTN:  BP is a bit elevated.  On Olmasartan   Needs to lose weight .   4.  Superficial venous thrombosis: He had a very symptomatic superficial venous thrombus in his right leg.  We tried to treat him with anticoagulation.  Initially he was placed on Eliquis but this was contraindicated when his rifampin was started.  Then he took Coumadin for short period of time but this made him feel overall  very poorly.  In addition, it was causing some bleeding into his knee joint.  He has now stopped his Coumadin.  He overall seems to be feeling better.   Medication Adjustments/Labs and Tests Ordered: Current medicines are reviewed at length with the patient today.  Concerns regarding medicines are outlined above.  No orders of the defined types were placed in this encounter.  No orders of the defined types were placed in this encounter.   There are no Patient Instructions on file for this visit.   Signed, Kristeen MissPhilip Joy Haegele, MD  01/11/2019 2:57 PM    [facility]

## 2019-01-18 ENCOUNTER — Encounter: Payer: Self-pay | Admitting: Infectious Diseases

## 2019-01-18 ENCOUNTER — Ambulatory Visit (INDEPENDENT_AMBULATORY_CARE_PROVIDER_SITE_OTHER): Payer: BLUE CROSS/BLUE SHIELD | Admitting: Infectious Diseases

## 2019-01-18 VITALS — BP 144/80 | HR 80 | Temp 98.2°F | Ht 74.0 in | Wt 325.0 lb

## 2019-01-18 DIAGNOSIS — Z880 Allergy status to penicillin: Secondary | ICD-10-CM

## 2019-01-18 DIAGNOSIS — T8453XD Infection and inflammatory reaction due to internal right knee prosthesis, subsequent encounter: Secondary | ICD-10-CM

## 2019-01-18 DIAGNOSIS — Z5181 Encounter for therapeutic drug level monitoring: Secondary | ICD-10-CM

## 2019-01-18 DIAGNOSIS — Z79899 Other long term (current) drug therapy: Secondary | ICD-10-CM

## 2019-01-18 DIAGNOSIS — Z96653 Presence of artificial knee joint, bilateral: Secondary | ICD-10-CM

## 2019-01-18 DIAGNOSIS — Z792 Long term (current) use of antibiotics: Secondary | ICD-10-CM

## 2019-01-18 DIAGNOSIS — B9561 Methicillin susceptible Staphylococcus aureus infection as the cause of diseases classified elsewhere: Secondary | ICD-10-CM

## 2019-01-18 DIAGNOSIS — Z888 Allergy status to other drugs, medicaments and biological substances status: Secondary | ICD-10-CM

## 2019-01-18 DIAGNOSIS — Z87891 Personal history of nicotine dependence: Secondary | ICD-10-CM | POA: Diagnosis not present

## 2019-01-18 DIAGNOSIS — Z79890 Hormone replacement therapy (postmenopausal): Secondary | ICD-10-CM

## 2019-01-18 MED ORDER — CEPHALEXIN 500 MG PO CAPS: 500.0000 mg | ORAL_CAPSULE | Freq: Three times a day (TID) | ORAL | 3 refills | Status: DC

## 2019-01-18 NOTE — Assessment & Plan Note (Addendum)
Bryan [name] continues to do well. He has 6 weeks of therapy left with cephalexin + rifampin to complete 6 months of recommended treatment. Will have him continue dual therapy through March 16th and then continue with cephalexin alone until his knee improves and return visit. Considering he was taken to the OR quickly for infection debridement I am more hopeful that DAIR approach will offer him cure. He may consider suppressive dose of antibiotic chronically. We will discuss further at future visits.  RTC in 3 m.

## 2019-01-18 NOTE — Progress Notes (Signed)
Patient: Bryan [name]  DOB: Sep 07, 1966 MRN: [id number] PCP: Maury Dus, MD    Patient Active Problem List   Diagnosis Date Noted  . Encounter for therapeutic drug monitoring 11/23/2018  . Medication monitoring encounter 09/28/2018  . OSA on CPAP   . Edema   . 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 S/P total knee arthroplasty, right 03/25/2017  . Hyperlipidemia 02/14/2017  . Family history of heart disease 02/14/2017  . Obstructive sleep apnea   . Hypothyroidism   . Hypertension   . Vitamin D deficiency      Subjective:  CC:  Bryan [name] is a 53 y.o. man here today for follow up on his prosthetic knee joint infection.   Brief ID Narrative:  He underwent bilateral total knee replacements (R on 02/26/17 and L on 04/07/17). He developed cellulitis of the R ankle after exposure to many insect bites that caused intense itching. He began having redness, swelling and fevers/chills/night sweats shortly after. He presented to surgeon's office and underwent R knee arthrocentesis revealing > 100,000 white cells c/w PJI. He underwent polyethylene liner exchange + debridement & irrigation of what apparently was extensive synoviits. Dr. Archie Endo Op Note 08/30/18 - excessive amt of purulent fluid in the joint; femoral, tibial and patellar components were well fixed to the bone. Significant synovitis in the knee. Synovial fluid from aspiration and intraoperative cultures all revealed MSSA; there was mention of GNRs on a gram stain from tissue however nothing grew out on culture aside from staph. He was discharged home on IV Cefazolin 2g Q8h. Due to apixaban and levothyroxine interactions it was decided to avoid addition of rifampin at first however his NOAC was d/c'd and we added Rifampin + PO cephalexin therapy to continue his PJI treatment course.   HPI/RPS:  Doing well today. Feels better after stopping the warfarin - reports that it overall made him feel  really badly and wore out overall. Unfortunately while on lovenox bridge he hit his knee very badly about a month ago and suffered torn bursa and nearly torn tendon. He had some bleeding/hematoma in to the knee and orhtopedic team ultimately took him off the blood thinner. He does not like the blood thinner because he cannot take the diclofenac for knee pain and this works very well and allows him to exercise.   He has tolerated Cephalexin + rifampin therapy well. Increased synthroid with managing endocrinology team recently to help combat rifampin. Very worried about the possibility of infection returning and coming off antibiotics too soon.   Review of Systems  Constitutional: Negative for chills, fever, malaise/fatigue and weight loss.  HENT: Negative for sore throat.   Respiratory: Negative for cough and sputum production.   Cardiovascular: Negative for chest pain and leg swelling.  Gastrointestinal: Negative for abdominal pain, diarrhea and vomiting.  Genitourinary: Negative for dysuria and flank pain.  Musculoskeletal: Positive for joint pain (R knee and R medial upper calf ). Negative for myalgias and neck pain.  Skin: Negative for rash.  Neurological: Negative for dizziness, tingling and headaches.  Psychiatric/Behavioral: Negative for depression and substance abuse. The patient is not nervous/anxious and does not have insomnia.     Past Medical History:  Diagnosis Date  . Allergy    takes Claritin daily  . Elevated cholesterol    takes Crestor daily  . Enlarged prostate    takes Flomax nightly  . GERD (gastroesophageal reflux disease)    takes Zantac daily  .  Hypertension    takes Lisinopril and Bystolic daily  . Hypothyroidism    takes Synthroid daily  . Medical history non-contributory   . Neuromuscular disorder (HCC)    bells palsy  . OSA (obstructive sleep apnea)    uses CPAP  . Primary localized osteoarthritis of left knee 03/25/2017  . Primary localized  osteoarthritis of right knee   . S/P total knee arthroplasty, right 03/25/2017  . Vitamin D deficiency     Outpatient Medications Prior to Visit  Medication Sig Dispense Refill  . acetaminophen (TYLENOL) 500 MG tablet Take 500-1,000 mg by mouth every 6 (six) hours as needed (for pain.).    Marland Kitchen amLODipine (NORVASC) 10 MG tablet Take 1 tablet (10 mg total) by mouth daily. 90 tablet 3  . clobetasol ointment (TEMOVATE) 1.49 % Apply 1 application topically daily as needed (to affected areas for psoriasis).     Marland Kitchen levothyroxine (SYNTHROID, LEVOTHROID) 300 MCG tablet Take 300 mcg by mouth daily before breakfast.     . levothyroxine (SYNTHROID, LEVOTHROID) 50 MCG tablet Take 50 mcg by mouth daily before breakfast.    . Nebivolol HCl 20 MG TABS Take 1 tablet (20 mg total) by mouth daily. 90 tablet 3  . olmesartan (BENICAR) 40 MG tablet Take 1 tablet (40 mg total) by mouth daily. 90 tablet 3  . ranitidine (ZANTAC) 75 MG tablet Take 75-150 mg by mouth See admin instructions. Take 75 mg by mouth in the morning then 75 mg in the afternoon then 150 mg at bedtime    . rifampin (RIFADIN) 300 MG capsule Take 1 capsule (300 mg total) by mouth 2 (two) times daily. 60 capsule 6  . rosuvastatin (CRESTOR) 20 MG tablet Take 20 mg by mouth at bedtime.     Marland Kitchen spironolactone (ALDACTONE) 25 MG tablet TAKE 1 TABLET BY MOUTH EVERY DAY 30 tablet 10  . tamsulosin (FLOMAX) 0.4 MG CAPS capsule Take 0.4 mg by mouth at bedtime.     No facility-administered medications prior to visit.      Allergies  Allergen Reactions  . Lipitor [Atorvastatin Calcium] Other (See Comments)    Muscle aches  . Penicillins Hives and Swelling    PATIENT HAS HAD KEFLEX WITH NO REACTION Swells "all over" Has patient had a PCN reaction causing immediate rash, facial/tongue/throat swelling, SOB or lightheadedness with hypotension: Yes Has patient had a PCN reaction causing severe rash involving mucus membranes or skin necrosis: Unk Has patient had a  PCN reaction that required hospitalization: No, but went to Urgent Care Has patient had a PCN reaction occurring within the last 10 years: No If all of the above answers are "NO", then may proceed    Social History   Tobacco Use  . Smoking status: Never Smoker  . Smokeless tobacco: Former Network engineer Use Topics  . Alcohol use: Yes    Alcohol/week: 3.0 standard drinks    Types: 3 Cans of beer per week    Comment: daily  . Drug use: No     Objective:   Vitals:   01/18/19 1344  BP: (!) 144/80  Pulse: 80  Temp: 98.2 F (36.8 C)  TempSrc: Oral  Weight: (!) 325 lb (147.4 kg)  Height: _0  (1.88 m)   Body mass index is 41.73 kg/m.  Physical Exam Vitals signs reviewed.  Constitutional:      Appearance: He is well-developed.     Comments: Seated comfortably in chair during visit. Appears well today.   HENT:  Mouth/Throat:     Dentition: Normal dentition. No dental abscesses.  Cardiovascular:     Rate and Rhythm: Normal rate and regular rhythm.     Heart sounds: Normal heart sounds. No murmur.  Pulmonary:     Effort: Pulmonary effort is normal.     Breath sounds: Normal breath sounds.  Abdominal:     General: There is no distension.     Palpations: Abdomen is soft.     Tenderness: There is no abdominal tenderness.  Musculoskeletal:     Comments: R knee swelling noted from injury about a month ago. No warmth. Limited ROM and can barely achieve 90 deg flexion.   Skin:    General: Skin is warm and dry.     Findings: No rash.  Neurological:     Mental Status: He is alert and oriented to person, place, and time.  Psychiatric:        Judgment: Judgment normal.    Lab Results: Lab Results  Component Value Date   WBC 4.6 11/23/2018   HGB 15.0 11/23/2018   HCT 44.7 11/23/2018   MCV 87.3 11/23/2018   PLT 272 11/23/2018    Lab Results  Component Value Date   CREATININE 0.82 11/23/2018   BUN 6 (L) 11/23/2018   NA 141 11/23/2018   K 3.7 11/23/2018   CL  103 11/23/2018   CO2 29 11/23/2018    Lab Results  Component Value Date   ALT 66 (H) 11/23/2018   AST 49 (H) 11/23/2018   ALKPHOS 66 08/29/2018   BILITOT 0.3 11/23/2018     Assessment & Plan:   Problem List Items Addressed This Visit      Unprioritized   Infection of total right knee replacement ([place]) - Primary    Bryan [name] continues to do well. He has 6 weeks of therapy left with cephalexin + rifampin to complete 6 months of recommended treatment. Will have him continue dual therapy through March 16th and then continue with cephalexin alone until his knee improves and return visit. Considering he was taken to the OR quickly for infection debridement I am more hopeful that DAIR approach will offer him cure. He may consider suppressive dose of antibiotic chronically. We will discuss further at future visits.  RTC in 3 m.       Relevant Medications   cephALEXin (KEFLEX) 500 MG capsule   Other Relevant Orders   Comprehensive metabolic panel   C-reactive protein   Sedimentation rate   Medication monitoring encounter   Relevant Orders   TSH   T4, free   Encounter for therapeutic drug monitoring    Liver function tests mildly elevated last visit in December. Repeat today. He attributed the feeling poorly to warfarin but really may be more d/t rifampin therapy; although he has improved.  CRP/ESR today keeping in mind that he had injury recently.         Return in about 3 months (around 04/18/2019) for follow up.  Janene Madeira, MSN, NP-C [facility] [place] Pager: [phone] Office: [phone]  01/18/19  2:47 PM

## 2019-01-18 NOTE — Assessment & Plan Note (Addendum)
Liver function tests mildly elevated last visit in December. Repeat today. He attributed the feeling poorly to warfarin but really may be more d/t rifampin therapy; although he has improved.  CRP/ESR today keeping in mind that he had injury recently.

## 2019-01-18 NOTE — Patient Instructions (Addendum)
If you are not planning on resuming the blood thinner please continue your rifampin with your cephalexin until March 16th.   If you need to go back on eliquis we can at this point consider stopping early if your symptoms/pain are worse and we need to - just please give me a call or send a MyChart to coordinate.   After March 17th I want you to stop the Rifampin and continue with your cephalexin alone three times a day until your knee improves a bit.   After we stop your Rifampin we likely may be able to reduce your thyroid dose back to 300 mcg a day - will send a note to Dr. Renato Gails to let him know when we plan on stopping your rifampin therapy.   Would like to see you back in 3 months to see how you are doing.

## 2019-01-19 ENCOUNTER — Telehealth: Payer: Self-pay | Admitting: Infectious Diseases

## 2019-01-19 DIAGNOSIS — Z5181 Encounter for therapeutic drug level monitoring: Secondary | ICD-10-CM

## 2019-01-19 DIAGNOSIS — T8453XD Infection and inflammatory reaction due to internal right knee prosthesis, subsequent encounter: Secondary | ICD-10-CM

## 2019-01-19 LAB — COMPREHENSIVE METABOLIC PANEL
AG Ratio: 1.3 (calc) (ref 1.0–2.5)
ALT: 33 U/L (ref 9–46)
AST: 20 U/L (ref 10–35)
Albumin: 3.6 g/dL (ref 3.6–5.1)
Alkaline phosphatase (APISO): 84 U/L (ref 35–144)
BILIRUBIN TOTAL: 0.4 mg/dL (ref 0.2–1.2)
BUN: 7 mg/dL (ref 7–25)
CO2: 30 mmol/L (ref 20–32)
Calcium: 8.7 mg/dL (ref 8.6–10.3)
Chloride: 103 mmol/L (ref 98–110)
Creat: 0.82 mg/dL (ref 0.70–1.33)
Globulin: 2.7 g/dL (calc) (ref 1.9–3.7)
Glucose, Bld: 84 mg/dL (ref 65–99)
Potassium: 4.3 mmol/L (ref 3.5–5.3)
Sodium: 141 mmol/L (ref 135–146)
Total Protein: 6.3 g/dL (ref 6.1–8.1)

## 2019-01-19 LAB — SEDIMENTATION RATE: Sed Rate: 103 mm/h — ABNORMAL HIGH (ref 0–20)

## 2019-01-19 LAB — T4, FREE: Free T4: 1.2 ng/dL (ref 0.8–1.8)

## 2019-01-19 LAB — TSH: TSH: 12.31 m[IU]/L — AB (ref 0.40–4.50)

## 2019-01-19 LAB — C-REACTIVE PROTEIN: CRP: 116.9 mg/L — ABNORMAL HIGH (ref <8.0)

## 2019-01-19 MED ORDER — CEPHALEXIN 500 MG PO CAPS: 500.0000 mg | ORAL_CAPSULE | Freq: Four times a day (QID) | ORAL | 2 refills | Status: DC

## 2019-01-19 NOTE — Telephone Encounter (Signed)
Sed Rate (mm/h)  Date Value  01/18/2019 103 (H)  11/23/2018 6   CRP  Date Value  01/18/2019 116.9 mg/L (H)  11/23/2018 3.8 mg/L  08/31/2018 34.7 mg/dL (H)   Inflammatory markers significantly elevated following recent mechanical injury to right knee. Will push his cephalexin dose to QID to see if this helps. Will repeat labs again in 6 weeks - if continues to be elevated may need to consider re-aspiration of joint to assess cell count to help with further intervention.   TSH continues to elevate despite very high doses of synthroid. At this point he has had 4 months of treatment with Rifampin therapy; considering difficulty in managing this and other medical conditions we will stop rifampin now. Will forward note to his managing team for thyroid. I advised him to discuss further with team to ensure he does not rebound the other way. Will check this again for him in 6 weeks.   He will notify me if anything worsens or develops with his knee in the mean time.   Rexene Alberts, MSN, NP-C [facility] for Infectious Disease [facility]  [email or web address] Pager: [phone]

## 2019-01-26 DIAGNOSIS — S8001XD Contusion of right knee, subsequent encounter: Secondary | ICD-10-CM | POA: Diagnosis not present

## 2019-01-26 DIAGNOSIS — Z1159 Encounter for screening for other viral diseases: Secondary | ICD-10-CM | POA: Diagnosis not present

## 2019-01-29 ENCOUNTER — Other Ambulatory Visit: Payer: Self-pay | Admitting: Orthopedic Surgery

## 2019-01-29 ENCOUNTER — Encounter: Payer: Self-pay | Admitting: Infectious Diseases

## 2019-01-29 DIAGNOSIS — M6281 Muscle weakness (generalized): Secondary | ICD-10-CM | POA: Diagnosis not present

## 2019-01-29 DIAGNOSIS — M25561 Pain in right knee: Secondary | ICD-10-CM

## 2019-01-29 DIAGNOSIS — M25661 Stiffness of right knee, not elsewhere classified: Secondary | ICD-10-CM | POA: Diagnosis not present

## 2019-01-29 DIAGNOSIS — R262 Difficulty in walking, not elsewhere classified: Secondary | ICD-10-CM | POA: Diagnosis not present

## 2019-01-29 NOTE — Progress Notes (Signed)
01/26/19, patient was seen in the Ortho office after inflammatory markers were found to be significantly elevated again following injury.   Plan will be to stop antibiotics x 2 weeks and aspirate knee joint for cell count an culture. [name] has also been started on prednisone for persistent swelling and MRI (which is scheduled later this week). Referred back to PT program to work on ROM.

## 2019-02-01 ENCOUNTER — Other Ambulatory Visit: Payer: Self-pay | Admitting: Orthopedic Surgery

## 2019-02-01 ENCOUNTER — Ambulatory Visit
Admission: RE | Admit: 2019-02-01 | Discharge: 2019-02-01 | Disposition: A | Discharge status: Home / Self Care | Payer: BLUE CROSS/BLUE SHIELD | Source: Ambulatory Visit | Attending: Orthopedic Surgery | Admitting: Orthopedic Surgery

## 2019-02-01 DIAGNOSIS — M25561 Pain in right knee: Secondary | ICD-10-CM

## 2019-02-01 NOTE — Progress Notes (Addendum)
[name] [address]   [address] [place], [place] [address] [phone] A Division of [facility]  Bryan [name], M.D.   Bryan [name], M.D.   Bryan [name], M.D. Bryan [name], M.D.   Bryan [name], M.D Bryan [name], M.D.    Bryan [name], M.D.    Bryan [name], M.D.     Bryan [name], M.D.   Bryan [name], III, PA-C        Bryan A. Shepperson, PA-C  Bryan Sedro-Woolley, PA-C   [place], [place]   RE: Bryan, [name]   [id number]   Aug 29, 2067 PROGRESS NOTE: 01-26-19 Bryan [name] is a 53 year-old seen for follow up evaluation from his right total knee infection.  He has gotten follow up bloodwork by the nurse practitioner who works in the infectious disease department, Bryan [name].  This follow up bloodwork has revealed an elevated CRP of 116.  His previous CRP was 3.8 on November 23, 2018.  His CRP at the time of his knee infection on August 31, 2018 was 34.7.  He also has an elevated SED rate of 103.  His previous SED rate on November 23, 2018 was 6.  I have spoken to Dr. Irena Reichmann who is the orthopedic and medical director of the [facility] in [place], who has recommended that he go off all antibiotics for two weeks and then have his knee re-aspirated to see if there is any potential persistent infection in the knee.  He did have an MSSA staph aureus infection at the time of his surgery in September.  Of note is that he has also had superficial phlebitis with a superficial thrombophlebitis documented by Doppler and a follow up Doppler one month ago showed this was still evident.  He is on Aspirin, he was on Coumadin, but had major side effects to this.  He also injured the knee while on Coumadin, hitting it against a table causing a significant hematoma in the anterior patellar tendon region and quadriceps tendon region and he is still struggling with pain in these  areas with swelling in the knee anteriorly and in the quad area and with significant decreased range of motion since he hit his knee.  He has not had any fevers or chills.  He has had some persistent swelling in the knee and leg, but nothing new.  He has been on Keflex 500 mg t.i.d. ever since his knee surgery five months ago and was increased to q.i.d. by Bryan [name] when she saw him last week with his new labs.    EXAMINATION: Well-developed, well-nourished male in no acute distress.  Alert and oriented.  Examination of his right knee reveals moderate swelling.  No erythema.  Diffuse pain.  Range of motion 0-75 degrees.  Knee is stable with normal patella tracking.  Examination of the left knee reveals full range of motion without pain, swelling, weakness or instability.  Vascular exam: Pulses are 2+ and symmetric.  Neurologic exam: Distal motor and sensory examination is within normal limits.    IMPRESSION: 1. 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 2. Right knee possible recurrent infection. 3. Right knee acute traumatic hematoma with patella tendon and possible quadriceps tendon injury.   4. Right leg superficial thrombophlebitis.   5. Status [name] previous left total knee replacement.    Continued      [name] [address]   [address] [place], [place] [address] [phone] A Division of [facility]  Bryan [name], M.D.   Bryan [name], M.D.   Bryan [name], M.D. Bryan [name], M.D.   Bryan [name], M.D Bryan [name], M.D.    Bryan [name], M.D.    Bryan [name], M.D.     Bryan [name], M.D.   Bryan [name], III, PA-C        Bryan A. Shepperson, PA-C  Bryan [place], PA-C   [place], [place]   RE: Bryan [name], Bryan [name]   [id number]   2066-03-09 PROGRESS NOTE: 01-26-19 PLAN: At this point I have talked to Bryan [name] in detail.   Told him that I had spoken to Dr. Irena Reichmann of the [facility], who has recommended stopping all of his antibiotics for the next two weeks prior to getting a new knee aspiration to see if he has a recurrent infection off of antibiotics.  He is agreeable with this plan.  We will place him on a Prednisone 12 day 10 mg dose pack for his persistent swelling in the leg.  He did have a follow up Doppler one month ago which was unchanged with his superficial thrombophlebitis.  We will obtain an MARS MRI of the knee to evaluate his patellar tendon and quadriceps tendon.  We will place him back in physical therapy to work on a knee rehab program to regain range of motion.  We will plan on seeing him back in two weeks for knee aspiration and follow up.    Bryan [name] A. Thurston [name], M.D.   Electronically verified by Bryan [name], M.D. RAW:jjh Cc: Bryan Alberts, FNP-C, fax: [phone] D 01-26-19 T 01-27-19     Bryan [name] [phone]  [facility]   DOB/Age 01-04-1966 52y Sex M   [address]   [place] [address]   SRS Supplied Demographics:     Bryan [name] [id number]    DOB/Age [birth year] 52y Sex M       Provider Bryan Marvel MD Report Status F      Ordered 01/26/2019    Collected 01/26/2019    Received 01/26/2019    Reported  01/26/2019    Requisition  [id number]                Test Name Result Flag Units Ref Range Status  CBC (INCLUDES DIFF/PLT)   WHITE BLOOD CELL COUNT 7.3 N Thousand/uL 3.8-10.8 F   RED BLOOD CELL COUNT 4.04 L Million/uL 4.20-5.80 F   HEMOGLOBIN 12.0 L g/dL 32.3-55.7 F   HEMATOCRIT 34.6 L % 38.5-50.0 F   MCV 85.6 N fL 80.0-100.0 F   MCH 29.7 N pg 27.0-33.0 F   MCHC 34.7 N g/dL 32.2-02.5 F   RDW 42.7 N % 11.0-15.0 F   PLATELET COUNT 428 H Thousand/uL 140-400 F   MPV 9.5 N fL 7.5-12.5 F   ABSOLUTE NEUTROPHILS 5314 N cells/uL 1500-7800 F   ABSOLUTE BAND NEUTROPHILS DNR N cells/uL 0-750 F   ABSOLUTE  METAMYELOCYTES DNR N cells/uL 0 F   ABSOLUTE MYELOCYTES DNR N cells/uL 0 F   ABSOLUTE PROMYELOCYTES DNR N cells/uL 0 F   ABSOLUTE LYMPHOCYTES 1029 N cells/uL [phone] F   ABSOLUTE MONOCYTES 657 N cells/uL 200-950 F   ABSOLUTE EOSINOPHILS 270 N cells/uL 15-500 F   ABSOLUTE BASOPHILS 29 N cells/uL 0-200 F   ABSOLUTE BLASTS DNR N cells/uL 0 F  ABSOLUTE NUCLEATED RBC DNR N cells/uL 0 F   NEUTROPHILS 72.8 N %  F   BAND NEUTROPHILS DNR N %  F   METAMYELOCYTES DNR N %  F   MYELOCYTES DNR N %  F   PROMYELOCYTES DNR N %  F   LYMPHOCYTES 14.1 N %  F   REACTIVE LYMPHOCYTES DNR N % 0-10 F   MONOCYTES 9.0 N %  F   EOSINOPHILS 3.7 N %  F   BASOPHILS 0.4 N %  F   BLASTS DNR N %  F   NUCLEATED RBC DNR N /100 WBC 0 F   COMMENT(S) DNR N   F  &&&

## 2019-02-08 DIAGNOSIS — M25561 Pain in right knee: Secondary | ICD-10-CM | POA: Diagnosis not present

## 2019-02-08 DIAGNOSIS — M25461 Effusion, right knee: Secondary | ICD-10-CM | POA: Diagnosis not present

## 2019-02-09 DIAGNOSIS — M25661 Stiffness of right knee, not elsewhere classified: Secondary | ICD-10-CM | POA: Diagnosis not present

## 2019-02-09 DIAGNOSIS — M6281 Muscle weakness (generalized): Secondary | ICD-10-CM | POA: Diagnosis not present

## 2019-02-09 DIAGNOSIS — R262 Difficulty in walking, not elsewhere classified: Secondary | ICD-10-CM | POA: Diagnosis not present

## 2019-02-09 DIAGNOSIS — M25561 Pain in right knee: Secondary | ICD-10-CM | POA: Diagnosis not present

## 2019-02-12 ENCOUNTER — Telehealth: Payer: Self-pay | Admitting: Infectious Diseases

## 2019-02-12 NOTE — Telephone Encounter (Signed)
Called to check in with Mr. Gillson regarding knee aspiration.  After a hold of 2 weeks of cephalexin he cultured out MSSA again unfortunately following his injury/bleed. He is now back on cephalexin. He has some night sweats but this has been what he considers to be baseline. He did start feeling poorly during hold of abx like he was coming down with the flu but this is better since resuming abx.   He is being referred to a specialist in [place] but does not yet have an appointment. We discussed that at this point with failure of I&D with poly exchange next step logically would be to consider 2-stage exchange, which we discussed on the phone. He is having some stressors from work and limited short term disability so will need to consider timing of things. He will send MyChart with an update from appt.   Should he develop fevers before his appointment I asked him to please call so we can check blood cultures; if he is febrile and more significantly ill he will need ER eval.

## 2019-02-16 DIAGNOSIS — Z01818 Encounter for other preprocedural examination: Secondary | ICD-10-CM | POA: Diagnosis not present

## 2019-02-16 DIAGNOSIS — M25561 Pain in right knee: Secondary | ICD-10-CM | POA: Diagnosis not present

## 2019-02-16 DIAGNOSIS — T8450XA Infection and inflammatory reaction due to unspecified internal joint prosthesis, initial encounter: Secondary | ICD-10-CM | POA: Diagnosis not present

## 2019-03-02 ENCOUNTER — Other Ambulatory Visit: Payer: BLUE CROSS/BLUE SHIELD

## 2019-04-20 ENCOUNTER — Ambulatory Visit: Payer: BLUE CROSS/BLUE SHIELD | Admitting: Infectious Diseases

## 2019-05-11 ENCOUNTER — Other Ambulatory Visit: Payer: Self-pay | Admitting: Infectious Diseases

## 2019-05-14 DIAGNOSIS — Z1159 Encounter for screening for other viral diseases: Secondary | ICD-10-CM | POA: Diagnosis not present

## 2019-05-14 DIAGNOSIS — M25561 Pain in right knee: Secondary | ICD-10-CM | POA: Diagnosis not present

## 2019-05-14 DIAGNOSIS — Z01812 Encounter for preprocedural laboratory examination: Secondary | ICD-10-CM | POA: Diagnosis not present

## 2019-05-14 DIAGNOSIS — K219 Gastro-esophageal reflux disease without esophagitis: Secondary | ICD-10-CM | POA: Diagnosis not present

## 2019-05-14 DIAGNOSIS — I509 Heart failure, unspecified: Secondary | ICD-10-CM | POA: Diagnosis not present

## 2019-05-14 DIAGNOSIS — G8929 Other chronic pain: Secondary | ICD-10-CM | POA: Diagnosis not present

## 2019-05-14 DIAGNOSIS — E039 Hypothyroidism, unspecified: Secondary | ICD-10-CM | POA: Diagnosis not present

## 2019-05-14 DIAGNOSIS — G4730 Sleep apnea, unspecified: Secondary | ICD-10-CM | POA: Diagnosis not present

## 2019-05-14 DIAGNOSIS — I110 Hypertensive heart disease with heart failure: Secondary | ICD-10-CM | POA: Diagnosis not present

## 2019-05-14 DIAGNOSIS — Z01818 Encounter for other preprocedural examination: Secondary | ICD-10-CM | POA: Diagnosis not present

## 2019-05-14 DIAGNOSIS — E785 Hyperlipidemia, unspecified: Secondary | ICD-10-CM | POA: Diagnosis not present

## 2019-05-16 ENCOUNTER — Other Ambulatory Visit: Payer: Self-pay | Admitting: Cardiovascular Disease

## 2019-05-18 DIAGNOSIS — X58XXXA Exposure to other specified factors, initial encounter: Secondary | ICD-10-CM | POA: Diagnosis not present

## 2019-05-18 DIAGNOSIS — I110 Hypertensive heart disease with heart failure: Secondary | ICD-10-CM | POA: Diagnosis not present

## 2019-05-18 DIAGNOSIS — G4730 Sleep apnea, unspecified: Secondary | ICD-10-CM | POA: Diagnosis not present

## 2019-05-18 DIAGNOSIS — E785 Hyperlipidemia, unspecified: Secondary | ICD-10-CM | POA: Diagnosis not present

## 2019-05-18 DIAGNOSIS — Z1159 Encounter for screening for other viral diseases: Secondary | ICD-10-CM | POA: Diagnosis not present

## 2019-05-18 DIAGNOSIS — T8453XA Infection and inflammatory reaction due to internal right knee prosthesis, initial encounter: Secondary | ICD-10-CM | POA: Diagnosis not present

## 2019-05-18 DIAGNOSIS — Z9989 Dependence on other enabling machines and devices: Secondary | ICD-10-CM | POA: Diagnosis not present

## 2019-05-18 DIAGNOSIS — I509 Heart failure, unspecified: Secondary | ICD-10-CM | POA: Diagnosis not present

## 2019-05-18 DIAGNOSIS — Z01812 Encounter for preprocedural laboratory examination: Secondary | ICD-10-CM | POA: Diagnosis not present

## 2019-05-21 ENCOUNTER — Other Ambulatory Visit: Payer: Self-pay | Admitting: Cardiovascular Disease

## 2019-05-21 DIAGNOSIS — Z96651 Presence of right artificial knee joint: Secondary | ICD-10-CM | POA: Diagnosis not present

## 2019-05-21 DIAGNOSIS — I110 Hypertensive heart disease with heart failure: Secondary | ICD-10-CM | POA: Diagnosis not present

## 2019-05-21 DIAGNOSIS — E785 Hyperlipidemia, unspecified: Secondary | ICD-10-CM | POA: Diagnosis not present

## 2019-05-21 DIAGNOSIS — B9561 Methicillin susceptible Staphylococcus aureus infection as the cause of diseases classified elsewhere: Secondary | ICD-10-CM | POA: Diagnosis not present

## 2019-05-21 DIAGNOSIS — Z880 Allergy status to penicillin: Secondary | ICD-10-CM | POA: Diagnosis not present

## 2019-05-21 DIAGNOSIS — G8918 Other acute postprocedural pain: Secondary | ICD-10-CM | POA: Diagnosis not present

## 2019-05-21 DIAGNOSIS — K219 Gastro-esophageal reflux disease without esophagitis: Secondary | ICD-10-CM | POA: Diagnosis not present

## 2019-05-21 DIAGNOSIS — Z7982 Long term (current) use of aspirin: Secondary | ICD-10-CM | POA: Diagnosis not present

## 2019-05-21 DIAGNOSIS — Z888 Allergy status to other drugs, medicaments and biological substances status: Secondary | ICD-10-CM | POA: Diagnosis not present

## 2019-05-21 DIAGNOSIS — I10 Essential (primary) hypertension: Secondary | ICD-10-CM | POA: Diagnosis not present

## 2019-05-21 DIAGNOSIS — Z96652 Presence of left artificial knee joint: Secondary | ICD-10-CM | POA: Diagnosis not present

## 2019-05-21 DIAGNOSIS — E669 Obesity, unspecified: Secondary | ICD-10-CM | POA: Diagnosis not present

## 2019-05-21 DIAGNOSIS — A4901 Methicillin susceptible Staphylococcus aureus infection, unspecified site: Secondary | ICD-10-CM | POA: Diagnosis not present

## 2019-05-21 DIAGNOSIS — N400 Enlarged prostate without lower urinary tract symptoms: Secondary | ICD-10-CM | POA: Diagnosis not present

## 2019-05-21 DIAGNOSIS — I5032 Chronic diastolic (congestive) heart failure: Secondary | ICD-10-CM | POA: Diagnosis not present

## 2019-05-21 DIAGNOSIS — G4733 Obstructive sleep apnea (adult) (pediatric): Secondary | ICD-10-CM | POA: Diagnosis not present

## 2019-05-21 DIAGNOSIS — Z79899 Other long term (current) drug therapy: Secondary | ICD-10-CM | POA: Diagnosis not present

## 2019-05-21 DIAGNOSIS — M25561 Pain in right knee: Secondary | ICD-10-CM | POA: Diagnosis not present

## 2019-05-21 DIAGNOSIS — T8453XA Infection and inflammatory reaction due to internal right knee prosthesis, initial encounter: Secondary | ICD-10-CM | POA: Diagnosis not present

## 2019-05-21 DIAGNOSIS — Z1159 Encounter for screening for other viral diseases: Secondary | ICD-10-CM | POA: Diagnosis not present

## 2019-05-21 DIAGNOSIS — E039 Hypothyroidism, unspecified: Secondary | ICD-10-CM | POA: Diagnosis not present

## 2019-05-23 DIAGNOSIS — I5032 Chronic diastolic (congestive) heart failure: Secondary | ICD-10-CM | POA: Diagnosis not present

## 2019-05-23 DIAGNOSIS — I10 Essential (primary) hypertension: Secondary | ICD-10-CM | POA: Diagnosis not present

## 2019-05-23 DIAGNOSIS — T8453XA Infection and inflammatory reaction due to internal right knee prosthesis, initial encounter: Secondary | ICD-10-CM | POA: Diagnosis not present

## 2019-05-23 DIAGNOSIS — E039 Hypothyroidism, unspecified: Secondary | ICD-10-CM | POA: Diagnosis not present

## 2019-05-23 DIAGNOSIS — T8453XD Infection and inflammatory reaction due to internal right knee prosthesis, subsequent encounter: Secondary | ICD-10-CM | POA: Diagnosis not present

## 2019-05-23 DIAGNOSIS — N400 Enlarged prostate without lower urinary tract symptoms: Secondary | ICD-10-CM | POA: Diagnosis not present

## 2019-05-24 DIAGNOSIS — T8453XA Infection and inflammatory reaction due to internal right knee prosthesis, initial encounter: Secondary | ICD-10-CM | POA: Diagnosis not present

## 2019-05-24 DIAGNOSIS — T8453XD Infection and inflammatory reaction due to internal right knee prosthesis, subsequent encounter: Secondary | ICD-10-CM | POA: Diagnosis not present

## 2019-05-27 DIAGNOSIS — T8453XA Infection and inflammatory reaction due to internal right knee prosthesis, initial encounter: Secondary | ICD-10-CM | POA: Diagnosis not present

## 2019-05-27 DIAGNOSIS — T8453XD Infection and inflammatory reaction due to internal right knee prosthesis, subsequent encounter: Secondary | ICD-10-CM | POA: Diagnosis not present

## 2019-05-31 DIAGNOSIS — T8453XD Infection and inflammatory reaction due to internal right knee prosthesis, subsequent encounter: Secondary | ICD-10-CM | POA: Diagnosis not present

## 2019-05-31 DIAGNOSIS — T8453XA Infection and inflammatory reaction due to internal right knee prosthesis, initial encounter: Secondary | ICD-10-CM | POA: Diagnosis not present

## 2019-05-31 DIAGNOSIS — Z792 Long term (current) use of antibiotics: Secondary | ICD-10-CM | POA: Diagnosis not present

## 2019-06-07 DIAGNOSIS — T8453XD Infection and inflammatory reaction due to internal right knee prosthesis, subsequent encounter: Secondary | ICD-10-CM | POA: Diagnosis not present

## 2019-06-07 DIAGNOSIS — Z792 Long term (current) use of antibiotics: Secondary | ICD-10-CM | POA: Diagnosis not present

## 2019-06-07 DIAGNOSIS — T8453XA Infection and inflammatory reaction due to internal right knee prosthesis, initial encounter: Secondary | ICD-10-CM | POA: Diagnosis not present

## 2019-06-10 DIAGNOSIS — T8453XA Infection and inflammatory reaction due to internal right knee prosthesis, initial encounter: Secondary | ICD-10-CM | POA: Diagnosis not present

## 2019-06-10 DIAGNOSIS — T8453XD Infection and inflammatory reaction due to internal right knee prosthesis, subsequent encounter: Secondary | ICD-10-CM | POA: Diagnosis not present

## 2019-06-14 DIAGNOSIS — B9561 Methicillin susceptible Staphylococcus aureus infection as the cause of diseases classified elsewhere: Secondary | ICD-10-CM | POA: Diagnosis not present

## 2019-06-14 DIAGNOSIS — Z5181 Encounter for therapeutic drug level monitoring: Secondary | ICD-10-CM | POA: Diagnosis not present

## 2019-06-14 DIAGNOSIS — T8453XD Infection and inflammatory reaction due to internal right knee prosthesis, subsequent encounter: Secondary | ICD-10-CM | POA: Diagnosis not present

## 2019-06-14 DIAGNOSIS — T8453XA Infection and inflammatory reaction due to internal right knee prosthesis, initial encounter: Secondary | ICD-10-CM | POA: Diagnosis not present

## 2019-06-16 DIAGNOSIS — T8453XS Infection and inflammatory reaction due to internal right knee prosthesis, sequela: Secondary | ICD-10-CM | POA: Diagnosis not present

## 2019-06-16 DIAGNOSIS — I10 Essential (primary) hypertension: Secondary | ICD-10-CM | POA: Diagnosis not present

## 2019-06-21 DIAGNOSIS — T8453XD Infection and inflammatory reaction due to internal right knee prosthesis, subsequent encounter: Secondary | ICD-10-CM | POA: Diagnosis not present

## 2019-06-21 DIAGNOSIS — T8453XA Infection and inflammatory reaction due to internal right knee prosthesis, initial encounter: Secondary | ICD-10-CM | POA: Diagnosis not present

## 2019-06-23 DIAGNOSIS — T8453XD Infection and inflammatory reaction due to internal right knee prosthesis, subsequent encounter: Secondary | ICD-10-CM | POA: Diagnosis not present

## 2019-06-23 DIAGNOSIS — T8453XA Infection and inflammatory reaction due to internal right knee prosthesis, initial encounter: Secondary | ICD-10-CM | POA: Diagnosis not present

## 2019-06-28 DIAGNOSIS — Z792 Long term (current) use of antibiotics: Secondary | ICD-10-CM | POA: Diagnosis not present

## 2019-06-28 DIAGNOSIS — T8453XA Infection and inflammatory reaction due to internal right knee prosthesis, initial encounter: Secondary | ICD-10-CM | POA: Diagnosis not present

## 2019-06-28 DIAGNOSIS — T8453XD Infection and inflammatory reaction due to internal right knee prosthesis, subsequent encounter: Secondary | ICD-10-CM | POA: Diagnosis not present

## 2019-07-02 DIAGNOSIS — T8453XA Infection and inflammatory reaction due to internal right knee prosthesis, initial encounter: Secondary | ICD-10-CM | POA: Diagnosis not present

## 2019-07-02 DIAGNOSIS — T8453XD Infection and inflammatory reaction due to internal right knee prosthesis, subsequent encounter: Secondary | ICD-10-CM | POA: Diagnosis not present

## 2019-07-27 DIAGNOSIS — T8453XA Infection and inflammatory reaction due to internal right knee prosthesis, initial encounter: Secondary | ICD-10-CM | POA: Diagnosis not present

## 2019-07-27 DIAGNOSIS — T8450XA Infection and inflammatory reaction due to unspecified internal joint prosthesis, initial encounter: Secondary | ICD-10-CM | POA: Diagnosis not present

## 2019-07-27 DIAGNOSIS — Z471 Aftercare following joint replacement surgery: Secondary | ICD-10-CM | POA: Diagnosis not present

## 2019-08-11 DIAGNOSIS — Z981 Arthrodesis status: Secondary | ICD-10-CM | POA: Diagnosis not present

## 2019-08-11 DIAGNOSIS — Z01812 Encounter for preprocedural laboratory examination: Secondary | ICD-10-CM | POA: Diagnosis not present

## 2019-08-11 DIAGNOSIS — Z01818 Encounter for other preprocedural examination: Secondary | ICD-10-CM | POA: Diagnosis not present

## 2019-08-11 DIAGNOSIS — T8453XA Infection and inflammatory reaction due to internal right knee prosthesis, initial encounter: Secondary | ICD-10-CM | POA: Diagnosis not present

## 2019-08-15 DIAGNOSIS — T8453XA Infection and inflammatory reaction due to internal right knee prosthesis, initial encounter: Secondary | ICD-10-CM | POA: Diagnosis not present

## 2019-08-15 DIAGNOSIS — Z20828 Contact with and (suspected) exposure to other viral communicable diseases: Secondary | ICD-10-CM | POA: Diagnosis not present

## 2019-08-15 DIAGNOSIS — Z01812 Encounter for preprocedural laboratory examination: Secondary | ICD-10-CM | POA: Diagnosis not present

## 2019-08-15 DIAGNOSIS — I509 Heart failure, unspecified: Secondary | ICD-10-CM | POA: Diagnosis not present

## 2019-08-15 DIAGNOSIS — E039 Hypothyroidism, unspecified: Secondary | ICD-10-CM | POA: Diagnosis not present

## 2019-08-15 DIAGNOSIS — K219 Gastro-esophageal reflux disease without esophagitis: Secondary | ICD-10-CM | POA: Diagnosis not present

## 2019-08-15 DIAGNOSIS — I110 Hypertensive heart disease with heart failure: Secondary | ICD-10-CM | POA: Diagnosis not present

## 2019-08-19 DIAGNOSIS — Z96651 Presence of right artificial knee joint: Secondary | ICD-10-CM | POA: Diagnosis not present

## 2019-08-19 DIAGNOSIS — M25561 Pain in right knee: Secondary | ICD-10-CM | POA: Diagnosis not present

## 2019-08-19 DIAGNOSIS — G4733 Obstructive sleep apnea (adult) (pediatric): Secondary | ICD-10-CM | POA: Diagnosis not present

## 2019-08-19 DIAGNOSIS — E785 Hyperlipidemia, unspecified: Secondary | ICD-10-CM | POA: Diagnosis not present

## 2019-08-19 DIAGNOSIS — I110 Hypertensive heart disease with heart failure: Secondary | ICD-10-CM | POA: Diagnosis not present

## 2019-08-19 DIAGNOSIS — Z4733 Aftercare following explantation of knee joint prosthesis: Secondary | ICD-10-CM | POA: Diagnosis not present

## 2019-08-19 DIAGNOSIS — Z6841 Body Mass Index (BMI) 40.0 and over, adult: Secondary | ICD-10-CM | POA: Diagnosis not present

## 2019-08-19 DIAGNOSIS — T8453XA Infection and inflammatory reaction due to internal right knee prosthesis, initial encounter: Secondary | ICD-10-CM | POA: Diagnosis not present

## 2019-08-19 DIAGNOSIS — J302 Other seasonal allergic rhinitis: Secondary | ICD-10-CM | POA: Diagnosis not present

## 2019-08-19 DIAGNOSIS — Z9989 Dependence on other enabling machines and devices: Secondary | ICD-10-CM | POA: Diagnosis not present

## 2019-08-19 DIAGNOSIS — Z87891 Personal history of nicotine dependence: Secondary | ICD-10-CM | POA: Diagnosis not present

## 2019-08-19 DIAGNOSIS — K219 Gastro-esophageal reflux disease without esophagitis: Secondary | ICD-10-CM | POA: Diagnosis not present

## 2019-08-19 DIAGNOSIS — I5032 Chronic diastolic (congestive) heart failure: Secondary | ICD-10-CM | POA: Diagnosis not present

## 2019-08-19 DIAGNOSIS — Z79899 Other long term (current) drug therapy: Secondary | ICD-10-CM | POA: Diagnosis not present

## 2019-08-19 DIAGNOSIS — G8918 Other acute postprocedural pain: Secondary | ICD-10-CM | POA: Diagnosis not present

## 2019-08-19 DIAGNOSIS — E039 Hypothyroidism, unspecified: Secondary | ICD-10-CM | POA: Diagnosis not present

## 2019-08-19 DIAGNOSIS — N400 Enlarged prostate without lower urinary tract symptoms: Secondary | ICD-10-CM | POA: Diagnosis not present

## 2019-08-19 DIAGNOSIS — Z86718 Personal history of other venous thrombosis and embolism: Secondary | ICD-10-CM | POA: Diagnosis not present

## 2019-08-24 DIAGNOSIS — Z0000 Encounter for general adult medical examination without abnormal findings: Secondary | ICD-10-CM | POA: Diagnosis not present

## 2019-08-24 DIAGNOSIS — K219 Gastro-esophageal reflux disease without esophagitis: Secondary | ICD-10-CM | POA: Diagnosis not present

## 2019-08-24 DIAGNOSIS — E782 Mixed hyperlipidemia: Secondary | ICD-10-CM | POA: Diagnosis not present

## 2019-08-24 DIAGNOSIS — I5032 Chronic diastolic (congestive) heart failure: Secondary | ICD-10-CM | POA: Diagnosis not present

## 2019-08-24 DIAGNOSIS — I10 Essential (primary) hypertension: Secondary | ICD-10-CM | POA: Diagnosis not present

## 2019-09-02 DIAGNOSIS — M25661 Stiffness of right knee, not elsewhere classified: Secondary | ICD-10-CM | POA: Diagnosis not present

## 2019-09-02 DIAGNOSIS — M25561 Pain in right knee: Secondary | ICD-10-CM | POA: Diagnosis not present

## 2019-09-02 DIAGNOSIS — M6281 Muscle weakness (generalized): Secondary | ICD-10-CM | POA: Diagnosis not present

## 2019-09-02 DIAGNOSIS — M1711 Unilateral primary osteoarthritis, right knee: Secondary | ICD-10-CM | POA: Diagnosis not present

## 2019-09-08 DIAGNOSIS — M25561 Pain in right knee: Secondary | ICD-10-CM | POA: Diagnosis not present

## 2019-09-08 DIAGNOSIS — M1711 Unilateral primary osteoarthritis, right knee: Secondary | ICD-10-CM | POA: Diagnosis not present

## 2019-09-08 DIAGNOSIS — M6281 Muscle weakness (generalized): Secondary | ICD-10-CM | POA: Diagnosis not present

## 2019-09-08 DIAGNOSIS — M25661 Stiffness of right knee, not elsewhere classified: Secondary | ICD-10-CM | POA: Diagnosis not present

## 2019-09-10 DIAGNOSIS — M25561 Pain in right knee: Secondary | ICD-10-CM | POA: Diagnosis not present

## 2019-09-10 DIAGNOSIS — M1711 Unilateral primary osteoarthritis, right knee: Secondary | ICD-10-CM | POA: Diagnosis not present

## 2019-09-10 DIAGNOSIS — M25661 Stiffness of right knee, not elsewhere classified: Secondary | ICD-10-CM | POA: Diagnosis not present

## 2019-09-10 DIAGNOSIS — M6281 Muscle weakness (generalized): Secondary | ICD-10-CM | POA: Diagnosis not present

## 2019-09-14 DIAGNOSIS — R262 Difficulty in walking, not elsewhere classified: Secondary | ICD-10-CM | POA: Diagnosis not present

## 2019-09-14 DIAGNOSIS — M1711 Unilateral primary osteoarthritis, right knee: Secondary | ICD-10-CM | POA: Diagnosis not present

## 2019-09-14 DIAGNOSIS — M25561 Pain in right knee: Secondary | ICD-10-CM | POA: Diagnosis not present

## 2019-09-14 DIAGNOSIS — M6281 Muscle weakness (generalized): Secondary | ICD-10-CM | POA: Diagnosis not present

## 2019-09-16 DIAGNOSIS — M6281 Muscle weakness (generalized): Secondary | ICD-10-CM | POA: Diagnosis not present

## 2019-09-16 DIAGNOSIS — M25561 Pain in right knee: Secondary | ICD-10-CM | POA: Diagnosis not present

## 2019-09-16 DIAGNOSIS — M1711 Unilateral primary osteoarthritis, right knee: Secondary | ICD-10-CM | POA: Diagnosis not present

## 2019-09-16 DIAGNOSIS — M25661 Stiffness of right knee, not elsewhere classified: Secondary | ICD-10-CM | POA: Diagnosis not present

## 2019-09-22 DIAGNOSIS — R262 Difficulty in walking, not elsewhere classified: Secondary | ICD-10-CM | POA: Diagnosis not present

## 2019-09-22 DIAGNOSIS — M1711 Unilateral primary osteoarthritis, right knee: Secondary | ICD-10-CM | POA: Diagnosis not present

## 2019-09-22 DIAGNOSIS — M25661 Stiffness of right knee, not elsewhere classified: Secondary | ICD-10-CM | POA: Diagnosis not present

## 2019-09-22 DIAGNOSIS — M25561 Pain in right knee: Secondary | ICD-10-CM | POA: Diagnosis not present

## 2019-09-22 DIAGNOSIS — M6281 Muscle weakness (generalized): Secondary | ICD-10-CM | POA: Diagnosis not present

## 2019-09-24 DIAGNOSIS — M6281 Muscle weakness (generalized): Secondary | ICD-10-CM | POA: Diagnosis not present

## 2019-09-24 DIAGNOSIS — M1711 Unilateral primary osteoarthritis, right knee: Secondary | ICD-10-CM | POA: Diagnosis not present

## 2019-09-24 DIAGNOSIS — M25561 Pain in right knee: Secondary | ICD-10-CM | POA: Diagnosis not present

## 2019-09-24 DIAGNOSIS — M25661 Stiffness of right knee, not elsewhere classified: Secondary | ICD-10-CM | POA: Diagnosis not present

## 2019-09-27 DIAGNOSIS — M6281 Muscle weakness (generalized): Secondary | ICD-10-CM | POA: Diagnosis not present

## 2019-09-27 DIAGNOSIS — M25661 Stiffness of right knee, not elsewhere classified: Secondary | ICD-10-CM | POA: Diagnosis not present

## 2019-09-27 DIAGNOSIS — M25561 Pain in right knee: Secondary | ICD-10-CM | POA: Diagnosis not present

## 2019-09-27 DIAGNOSIS — M1711 Unilateral primary osteoarthritis, right knee: Secondary | ICD-10-CM | POA: Diagnosis not present

## 2019-09-30 DIAGNOSIS — M25561 Pain in right knee: Secondary | ICD-10-CM | POA: Diagnosis not present

## 2019-09-30 DIAGNOSIS — M25661 Stiffness of right knee, not elsewhere classified: Secondary | ICD-10-CM | POA: Diagnosis not present

## 2019-09-30 DIAGNOSIS — M1711 Unilateral primary osteoarthritis, right knee: Secondary | ICD-10-CM | POA: Diagnosis not present

## 2019-09-30 DIAGNOSIS — M6281 Muscle weakness (generalized): Secondary | ICD-10-CM | POA: Diagnosis not present

## 2019-10-05 DIAGNOSIS — M6281 Muscle weakness (generalized): Secondary | ICD-10-CM | POA: Diagnosis not present

## 2019-10-05 DIAGNOSIS — M25561 Pain in right knee: Secondary | ICD-10-CM | POA: Diagnosis not present

## 2019-10-05 DIAGNOSIS — M25661 Stiffness of right knee, not elsewhere classified: Secondary | ICD-10-CM | POA: Diagnosis not present

## 2019-10-05 DIAGNOSIS — M1711 Unilateral primary osteoarthritis, right knee: Secondary | ICD-10-CM | POA: Diagnosis not present

## 2019-10-07 DIAGNOSIS — M25661 Stiffness of right knee, not elsewhere classified: Secondary | ICD-10-CM | POA: Diagnosis not present

## 2019-10-07 DIAGNOSIS — M1711 Unilateral primary osteoarthritis, right knee: Secondary | ICD-10-CM | POA: Diagnosis not present

## 2019-10-07 DIAGNOSIS — M6281 Muscle weakness (generalized): Secondary | ICD-10-CM | POA: Diagnosis not present

## 2019-10-07 DIAGNOSIS — R262 Difficulty in walking, not elsewhere classified: Secondary | ICD-10-CM | POA: Diagnosis not present

## 2019-10-12 DIAGNOSIS — M25661 Stiffness of right knee, not elsewhere classified: Secondary | ICD-10-CM | POA: Diagnosis not present

## 2019-10-12 DIAGNOSIS — M6281 Muscle weakness (generalized): Secondary | ICD-10-CM | POA: Diagnosis not present

## 2019-10-12 DIAGNOSIS — M25561 Pain in right knee: Secondary | ICD-10-CM | POA: Diagnosis not present

## 2019-10-12 DIAGNOSIS — M1711 Unilateral primary osteoarthritis, right knee: Secondary | ICD-10-CM | POA: Diagnosis not present

## 2019-10-21 DIAGNOSIS — M1711 Unilateral primary osteoarthritis, right knee: Secondary | ICD-10-CM | POA: Diagnosis not present

## 2019-10-21 DIAGNOSIS — M25561 Pain in right knee: Secondary | ICD-10-CM | POA: Diagnosis not present

## 2019-10-21 DIAGNOSIS — M25661 Stiffness of right knee, not elsewhere classified: Secondary | ICD-10-CM | POA: Diagnosis not present

## 2019-10-21 DIAGNOSIS — M6281 Muscle weakness (generalized): Secondary | ICD-10-CM | POA: Diagnosis not present

## 2019-10-24 IMAGING — MR MR KNEE*R* W/O CM
4 of 6 series · 19 of 40 positions shown · non-contrast
Comparison: MRI right knee 12/14/2016.

CLINICAL DATA: Right knee pain since a blow to the knee on a bed
post approximately 5 weeks ago. History of right knee infection
after total knee arthroplasty.

EXAM:
MRI OF THE RIGHT KNEE WITHOUT CONTRAST
TECHNIQUE: Multiplanar, multisequence MR imaging of the knee was performed. No
intravenous contrast was administered.

[Series 17: t1_tse ax · axial · 4.0mm · 0.31mm/px · z∈[-105,-5]mm · 3 of 30 slices shown]
[im 5/30]
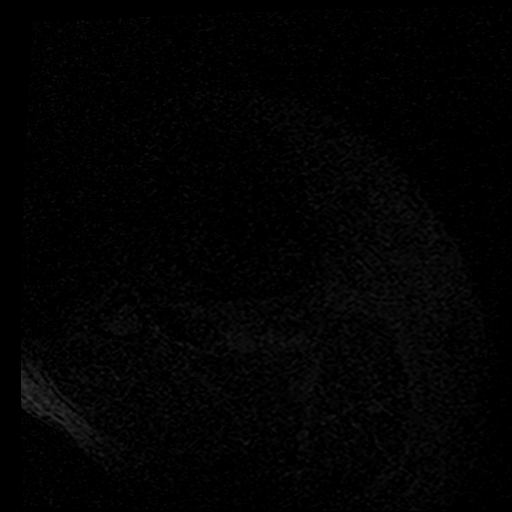
[im 15/30]
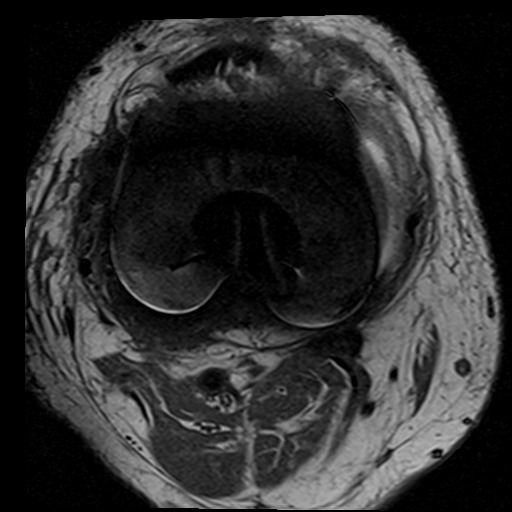
[im 25/30]
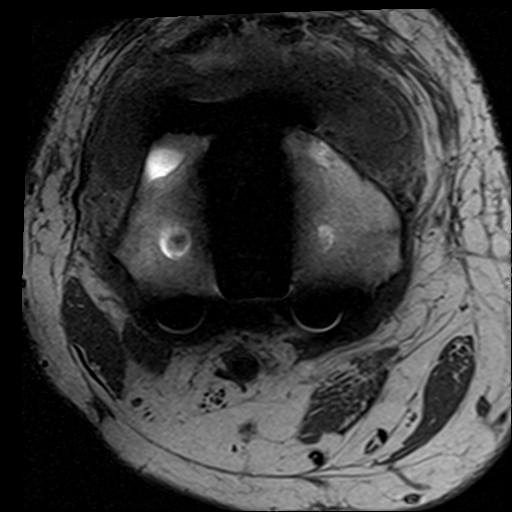

[Series 18: T1 · coronal · 4.0mm · 0.33mm/px · 7 of 29 slices shown (1 of 2)]
[im 1/29]
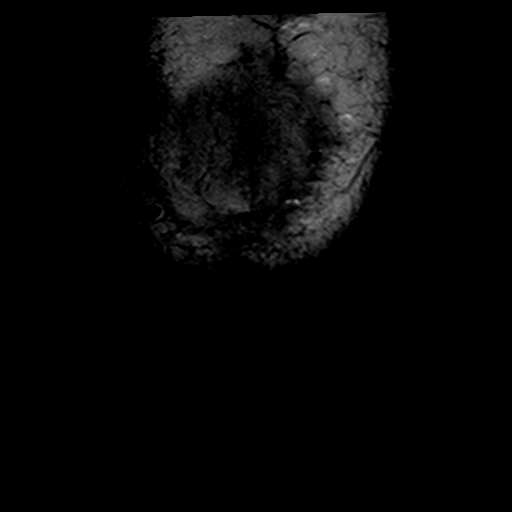
[im 5/29]
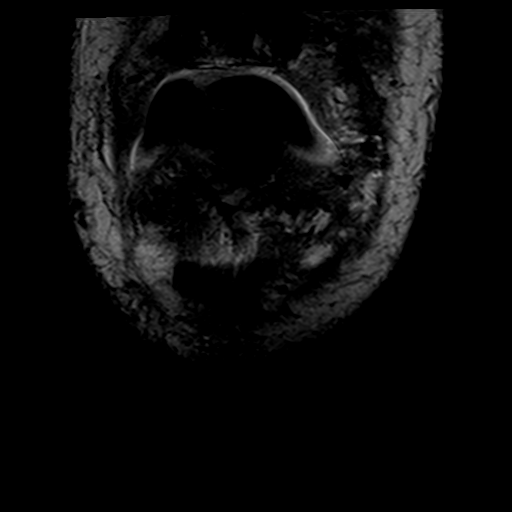
[im 10/29]
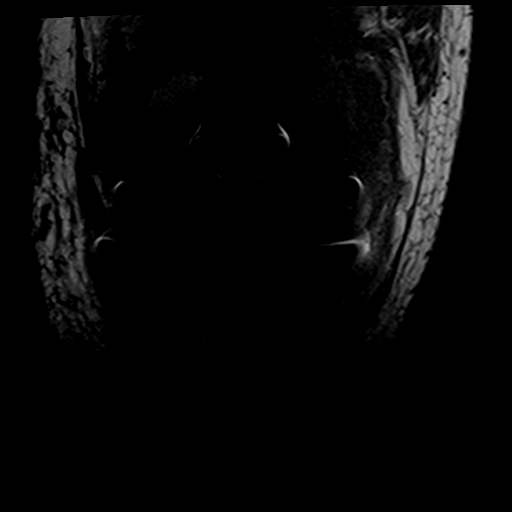
[im 15/29]
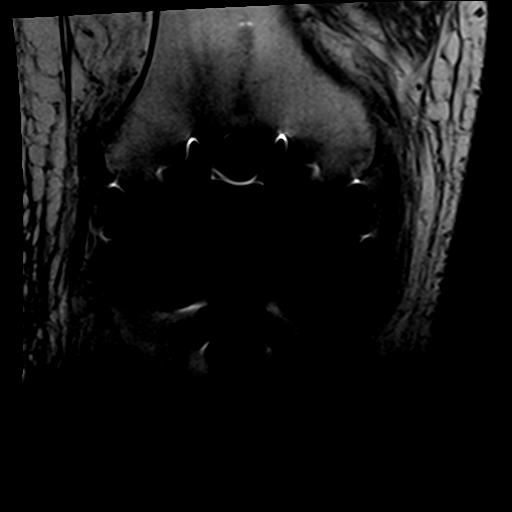
[im 19/29]
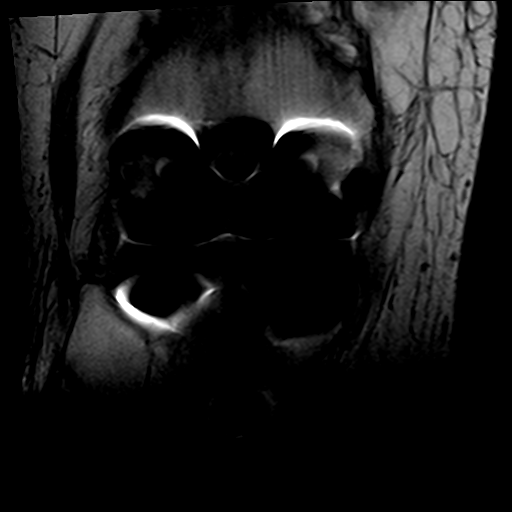
[im 24/29]
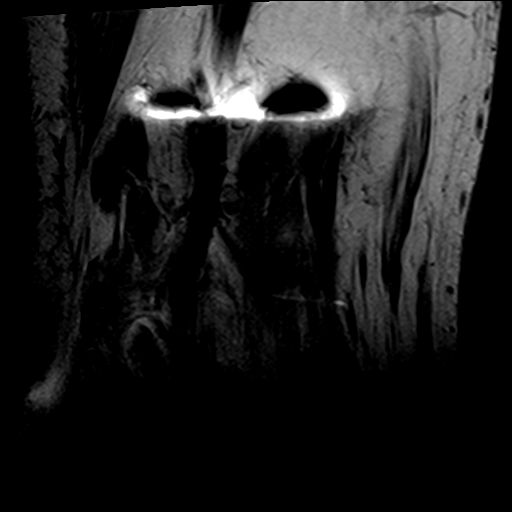
[im 29/29]
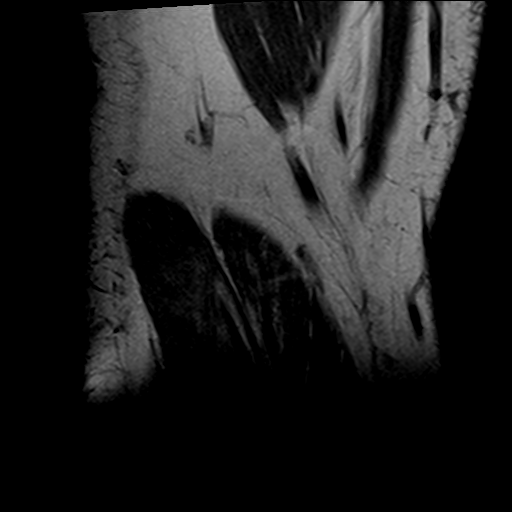

[Series 19: T1 · sagittal · 4.0mm · 0.33mm/px · 6 of 28 slices shown (2 of 2)]
[im 1/28]
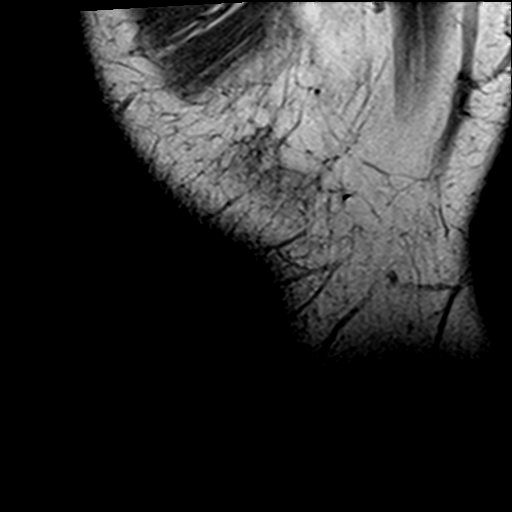
[im 6/28]
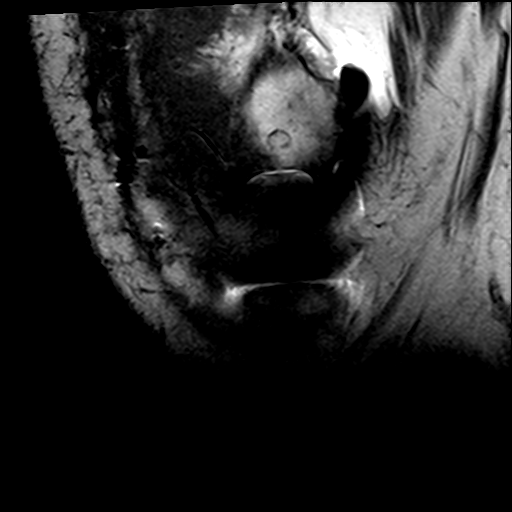
[im 11/28]
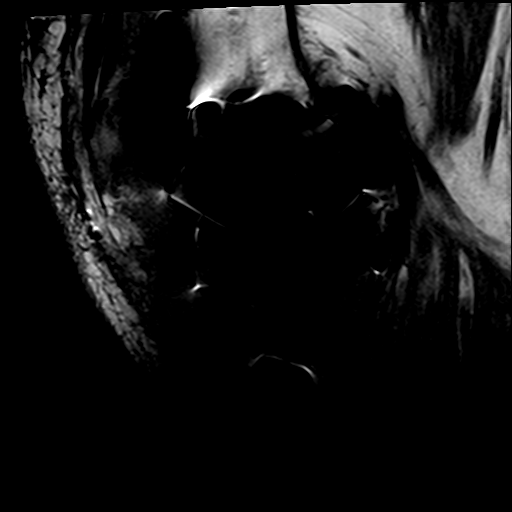
[im 17/28]
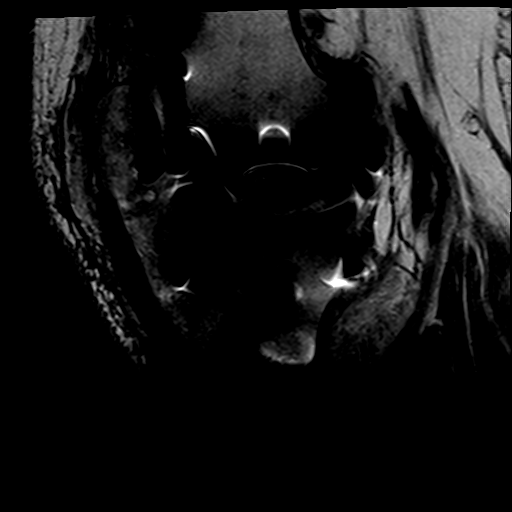
[im 22/28]
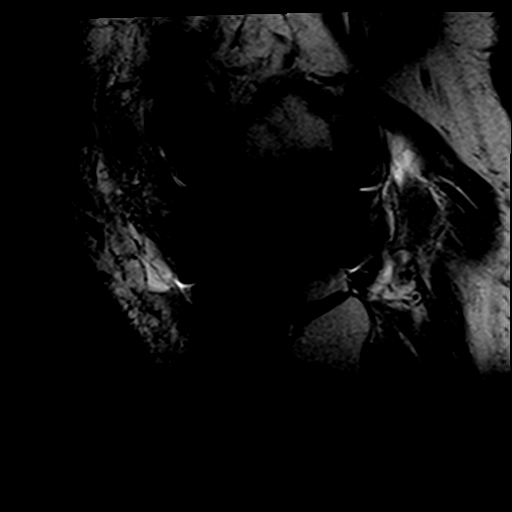
[im 28/28]
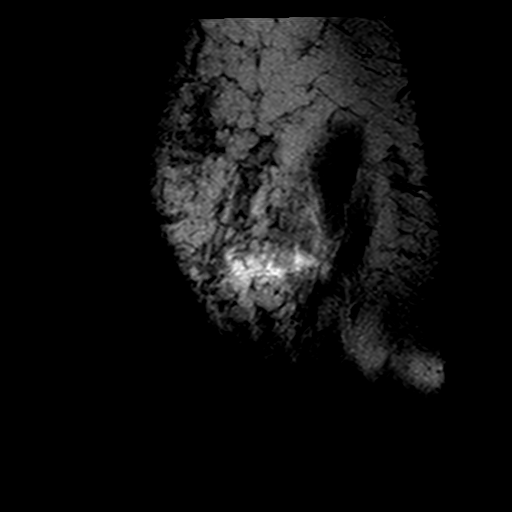

[Series 20: STIR · axial · 4.0mm · 0.31mm/px · z∈[-94,+6]mm · 3 of 30 slices shown]
[im 5/30]
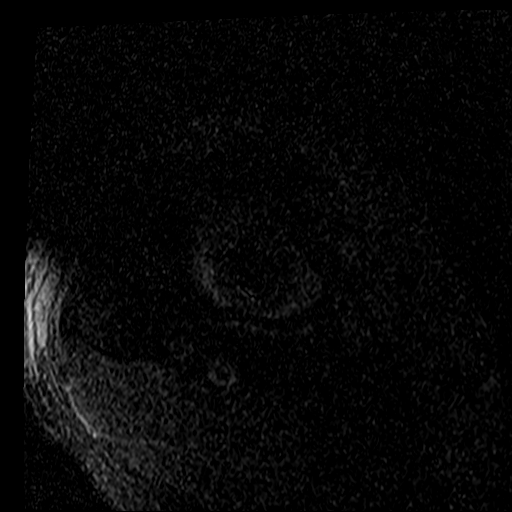
[im 15/30]
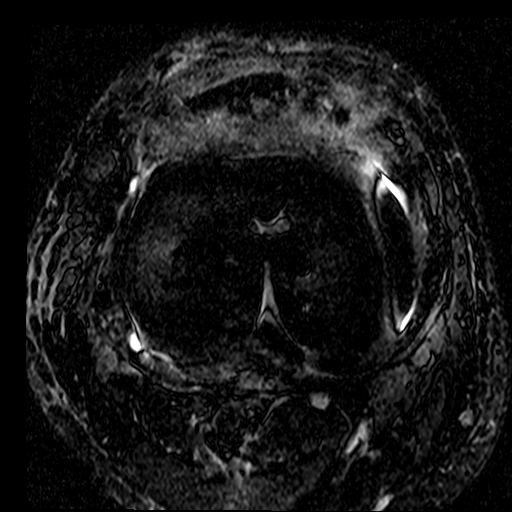
[im 25/30]
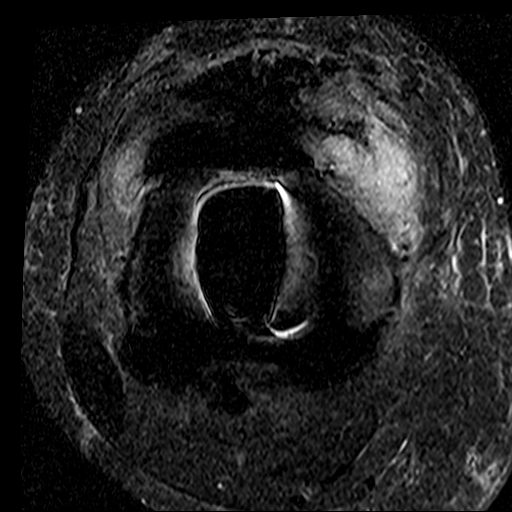

[19 of 40 positions shown; findings below may reference images not displayed]

FINDINGS: Despite using artifact reduction techniques in this patient with an
arthroplasty, there is extensive artifact on the exam.

No acute bony abnormality is identified. Synovium about the knee
appears markedly thickened. Small joint effusion is present. No
Baker's cyst. The quadriceps and patellar tendons and medial and
lateral collateral ligament complexes appear intact.
IMPRESSION: Limited examination demonstrates no acute bony abnormality. Synovium
about the knee appears thickened compatible with synovitis.

## 2019-12-03 ENCOUNTER — Other Ambulatory Visit: Payer: Self-pay | Admitting: Cardiovascular Disease

## 2020-01-02 ENCOUNTER — Other Ambulatory Visit: Payer: Self-pay | Admitting: Cardiovascular Disease

## 2020-01-26 ENCOUNTER — Other Ambulatory Visit: Payer: Self-pay | Admitting: Cardiovascular Disease

## 2020-01-31 DIAGNOSIS — E559 Vitamin D deficiency, unspecified: Secondary | ICD-10-CM | POA: Diagnosis not present

## 2020-01-31 DIAGNOSIS — I10 Essential (primary) hypertension: Secondary | ICD-10-CM | POA: Diagnosis not present

## 2020-01-31 DIAGNOSIS — Z0000 Encounter for general adult medical examination without abnormal findings: Secondary | ICD-10-CM | POA: Diagnosis not present

## 2020-01-31 DIAGNOSIS — R7309 Other abnormal glucose: Secondary | ICD-10-CM | POA: Diagnosis not present

## 2020-01-31 DIAGNOSIS — N62 Hypertrophy of breast: Secondary | ICD-10-CM | POA: Diagnosis not present

## 2020-01-31 DIAGNOSIS — M6208 Separation of muscle (nontraumatic), other site: Secondary | ICD-10-CM | POA: Diagnosis not present

## 2020-01-31 DIAGNOSIS — E782 Mixed hyperlipidemia: Secondary | ICD-10-CM | POA: Diagnosis not present

## 2020-03-15 DIAGNOSIS — S0502XA Injury of conjunctiva and corneal abrasion without foreign body, left eye, initial encounter: Secondary | ICD-10-CM | POA: Diagnosis not present

## 2020-05-03 DIAGNOSIS — E559 Vitamin D deficiency, unspecified: Secondary | ICD-10-CM | POA: Diagnosis not present

## 2020-09-07 DIAGNOSIS — I499 Cardiac arrhythmia, unspecified: Secondary | ICD-10-CM | POA: Diagnosis not present

## 2020-09-07 DIAGNOSIS — I10 Essential (primary) hypertension: Secondary | ICD-10-CM | POA: Diagnosis not present

## 2020-09-07 DIAGNOSIS — R7303 Prediabetes: Secondary | ICD-10-CM | POA: Diagnosis not present

## 2020-09-07 DIAGNOSIS — E782 Mixed hyperlipidemia: Secondary | ICD-10-CM | POA: Diagnosis not present

## 2020-10-22 ENCOUNTER — Encounter: Payer: Self-pay | Admitting: Cardiovascular Disease

## 2020-10-22 NOTE — Progress Notes (Signed)
Cardiology Office Note:    Date:  10/23/2020   ID:  Bryan [name], DOB May 03, 1966, MRN [id number]  PCP:  Elias Else, MD  Cardiologist:  Kristeen Miss, MD  Electrophysiologist:  None   Problem lIst 1. Hypertensive heart disease with diastolic CHF 2. Knee replacement with recent infection and redo  R knee replacemet / polyexchange ( replaced plastic parts )  3.  OSA 4. Hypothyroidism 5. Hyperlipidemia   Referring MD: Adrienne Mocha, PA   Chief Complaint  Patient presents with  . Hypertension  . Congestive Heart Failure     Bryan [name] is a 54 y.o. male with a hx of HTN for years  Bilateral knee replacement .      OSA  -  Uses CPAP every night   4 weeks ago, developed a small bug bit like infection in his right ankle  Infection spread,   Developed cellulitis Became very fatigued,  Fever , chills,  Right knee replacement became infected.   Had right knee surgery  Became hypoxic during the surgery  Developed flash pulmonary edema during surgery . Spent 2 days in ICU.  Did not  Get lasix  O2 sats stayed around 93-94%.   O2 sats have remained low.  sats go down if he is sleepy  No CP  Has some DOE - with exertion for the past  He thinks its due to his weight.  Has gained weight over the past several years ,  More weight gain in the last several years .    Oct. 21, 2019: breathing is better  O2 sats = 94% today .  Right knee is better ,  Has an infection is his right knee replacement  - is on Rifampin  Just started diclofenac 75 BID - really helps his knee pain   BP has been elevated.  Is not eating a low salt diet   January 11, 2019: Bryan [name] is seen for follow-up visit.    He has chronic diastolic CHF .  e is developed a superficial thrombophlebitis in his right leg.  We treated him for a while with Eliquis but he has stopped this at this point .  We had changed him to coumadin  He had a very symptomatic superficial venous thrombus in his right leg.  We  tried to treat him with anticoagulation.  Initially he was placed on Eliquis but this was contraindicated when his rifampin was started.  Then he took Coumadin for short period of time but this made him feel overall very poorly.  In addition, it was causing some bleeding into his knee joint.  He has now stopped his Coumadin.  He overall seems to be feeling better.  Trying to avoid salty foods.   His knee is generally getting better. Wt today is 329 lbs. , has been stable .     Nov. 8, 2021  Bryan [name] is seen again today for follow up of his diastolic CHF He has a hx of superficial venous thrombosis Wt today is 338 lbs. Has gained some weight .  He is seen today for evaluation of an abnormal ECG at the request of Dr. Nicholos Johns.  Was seen by his primary MD / PA ECG showed PACs  Denies any cp. Has DOE  No syncope  Is back exercising regularly ,   He admits that he is eating too much .  Does yard work .   Lifts weights .  No CP with any of his activities.  His knee is getting better     Past Medical History:  Diagnosis Date  . Allergy    takes Claritin daily  . Elevated cholesterol    takes Crestor daily  . Enlarged prostate    takes Flomax nightly  . GERD (gastroesophageal reflux disease)    takes Zantac daily  . Hypertension    takes Lisinopril and Bystolic daily  . Hypothyroidism    takes Synthroid daily  . Medical history non-contributory   . Neuromuscular disorder (HCC)    bells palsy  . OSA (obstructive sleep apnea)    uses CPAP  . Primary localized osteoarthritis of left knee 03/25/2017  . Primary localized osteoarthritis of right knee   . S/P total knee arthroplasty, right 03/25/2017  . Vitamin D deficiency     Past Surgical History:  Procedure Laterality Date  . I & D KNEE WITH POLY EXCHANGE Right 08/30/2018   Procedure: RIGHT TOTAL KNEE WASHOUT AND POLY EXCHANGE;  Surgeon: Salvatore Marvel, MD;  Location: [facility] OR;  Service: Orthopedics;  Laterality: Right;  . JOINT  REPLACEMENT    . MENISCUS REPAIR Right 2017   torn meniscus  . NO PAST SURGERIES    . TOTAL KNEE ARTHROPLASTY Right 02/26/2017   Procedure: TOTAL KNEE ARTHROPLASTY;  Surgeon: Salvatore Marvel, MD;  Location: [facility] OR;  Service: Orthopedics;  Laterality: Right;  . TOTAL KNEE ARTHROPLASTY Left 04/07/2017  . TOTAL KNEE ARTHROPLASTY Left 04/07/2017   Procedure: LEFT TOTAL KNEE ARTHROPLASTY;  Surgeon: Salvatore Marvel, MD;  Location: [facility] OR;  Service: Orthopedics;  Laterality: Left;    Current Medications: Current Meds  Medication Sig  . acetaminophen (TYLENOL) 500 MG tablet Take 500-1,000 mg by mouth every 6 (six) hours as needed (for pain.).  Marland Kitchen amLODipine (NORVASC) 10 MG tablet Take 1 tablet (10 mg total) by mouth daily.  Marland Kitchen BYSTOLIC 20 MG TABS TAKE 1 TABLET BY MOUTH EVERY DAY  . cetirizine (ZYRTEC) 10 MG tablet Take 1 tablet by mouth daily as needed.  . clobetasol ointment (TEMOVATE) 0.05 % Apply 1 application topically daily as needed (to affected areas for psoriasis).   . famotidine (PEPCID) 10 MG tablet Take 1 tablet by mouth at bedtime.  Marland Kitchen levothyroxine (SYNTHROID, LEVOTHROID) 300 MCG tablet Take 300 mcg by mouth daily before breakfast.   . loratadine (CLARITIN) 10 MG tablet Take 1 tablet by mouth daily as needed.  Marland Kitchen olmesartan (BENICAR) 40 MG tablet Take 1 tablet (40 mg total) by mouth daily. Please make annual appt with Dr. Elease Hashimoto for refills. [phone]. 1st attempt.  . ranitidine (ZANTAC) 75 MG tablet Take 75-150 mg by mouth See admin instructions. Take 75 mg by mouth in the morning then 75 mg in the afternoon then 150 mg at bedtime  . rosuvastatin (CRESTOR) 20 MG tablet Take 20 mg by mouth at bedtime.   . tamsulosin (FLOMAX) 0.4 MG CAPS capsule Take 0.4 mg by mouth at bedtime.  . [DISCONTINUED] cephALEXin (KEFLEX) 500 MG capsule TAKE 1 CAPSULE BY MOUTH FOUR TIMES A DAY  . [DISCONTINUED] levothyroxine (SYNTHROID, LEVOTHROID) 50 MCG tablet Take 50 mcg by mouth daily before breakfast.      Allergies:   Lipitor [atorvastatin calcium] and Penicillins   Social History   Socioeconomic History  . Marital status: Married    Spouse name: Not on file  . Number of children: Not on file  . Years of education: Not on file  . Highest education level: Not on file  Occupational History  . 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 Smoking status: Never Smoker  . Smokeless tobacco: Former Clinical biochemist  . Vaping Use: Never used  Substance and Sexual Activity  . Alcohol use: Yes    Alcohol/week: 3.0 standard drinks    Types: 3 Cans of beer per week    Comment: daily  . Drug use: No  . Sexual activity: Yes  Other Topics Concern  . Not on file  Social History Narrative  . Not on file   Social Determinants of Health   Financial Resource Strain:   . Difficulty of Paying Living Expenses: Not on file  Food Insecurity:   . Worried About Programme researcher, broadcasting/film/video in the Last Year: Not on file  . Ran Out of Food in the Last Year: Not on file  Transportation Needs:   . Lack of Transportation (Medical): Not on file  . Lack of Transportation (Non-Medical): Not on file  Physical Activity:   . Days of Exercise per Week: Not on file  . Minutes of Exercise per Session: Not on file  Stress:   . Feeling of Stress : Not on file  Social Connections:   . Frequency of Communication with Friends and Family: Not on file  . Frequency of Social Gatherings with Friends and Family: Not on file  . Attends Religious Services: Not on file  . Active Member of Clubs or Organizations: Not on file  . Attends Banker Meetings: Not on file  . Marital Status: Not on file     Family History: The patient's family history includes Atrial fibrillation in his father; Deep vein thrombosis in his mother; Heart attack in his father; Hypothyroidism in an other family member; Pulmonary embolism in an other family member; Rheum arthritis in his  mother.  ROS:   Please see the history of present illness.     All other systems reviewed and are negative.  EKGs/Labs/Other Studies Reviewed:    The following studies were reviewed today:   EKG:     Recent Labs: No results found for requested labs within last 8760 hours.  Recent Lipid Panel No results found for: CHOL, TRIG, HDL, CHOLHDL, VLDL, LDLCALC, LDLDIRECT  Physical Exam: Blood pressure 132/74, pulse 60, height 6\' 2"  (1.88 m), weight (!) 338 lb 12.8 oz (153.7 kg), SpO2 97 %.  GEN:  Middle age male,  NAD  HEENT: Normal NECK: No JVD; No carotid bruits LYMPHATICS: No lymphadenopathy CARDIAC: RRR , no murmurs, rubs, gallops RESPIRATORY:  Clear to auscultation without rales, wheezing or rhonchi  ABDOMEN: Soft, non-tender, non-distended MUSCULOSKELETAL:  No edema; No deformity  SKIN: Warm and dry NEUROLOGIC:  Alert and oriented x 3    ASSESSMENT:    1. Essential hypertension   2. Chronic diastolic congestive heart failure (HCC)    PLAN:      1.   Chronic diastolic CHF-  Has some DOE but this seems to be due to deconditioning and excess weight.   He knows what he needs to do .  Just a matter of excuting it .    2.   OSA :   Wears his CPAP   3.   HTN:  BP is well controlled.   4.  Superficial venous thrombosis:   No further symptoms    Medication Adjustments/Labs and Tests Ordered: Current medicines are reviewed at length with the patient today.  Concerns regarding medicines are outlined above.  Orders  Placed This Encounter  Procedures  . EKG 12-Lead   No orders of the defined types were placed in this encounter.   Patient Instructions  Medication Instructions:  Your physician recommends that you continue on your current medications as directed. Please refer to the Current Medication list given to you today.  *If you need a refill on your cardiac medications before your next appointment, please call your pharmacy*   Lab Work: none If you have labs  (blood work) drawn today and your tests are completely normal, you will receive your results only by: Marland Kitchen MyChart Message (if you have MyChart) OR . A paper copy in the mail If you have any lab test that is abnormal or we need to change your treatment, we will call you to review the results.   Testing/Procedures: none   Follow-Up: At [facility], you and your health needs are our priority.  As part of our continuing mission to provide you with exceptional heart care, we have created designated Provider Care Teams.  These Care Teams include your primary Cardiologist (physician) and Advanced Practice Providers (APPs -  Physician Assistants and Nurse Practitioners) who all work together to provide you with the care you need, when you need it.  We recommend signing up for the patient portal called "MyChart".  Sign up information is provided on this After Visit Summary.  MyChart is used to connect with patients for Virtual Visits (Telemedicine).  Patients are able to view lab/test results, encounter notes, upcoming appointments, etc.  Non-urgent messages can be sent to your provider as well.   To learn more about what you can do with MyChart, go to [email or web address].    Your next appointment:  As Needed     Signed, Kristeen Miss, MD  10/23/2020 9:19 AM    [facility]

## 2020-10-23 ENCOUNTER — Encounter: Payer: Self-pay | Admitting: Cardiovascular Disease

## 2020-10-23 ENCOUNTER — Ambulatory Visit: Payer: BLUE CROSS/BLUE SHIELD | Admitting: Cardiovascular Disease

## 2020-10-23 ENCOUNTER — Other Ambulatory Visit: Payer: Self-pay

## 2020-10-23 VITALS — BP 132/74 | HR 60 | Ht 74.0 in | Wt 338.8 lb

## 2020-10-23 DIAGNOSIS — I10 Essential (primary) hypertension: Secondary | ICD-10-CM

## 2020-10-23 DIAGNOSIS — I5032 Chronic diastolic (congestive) heart failure: Secondary | ICD-10-CM | POA: Diagnosis not present

## 2020-10-23 DIAGNOSIS — I491 Atrial premature depolarization: Secondary | ICD-10-CM | POA: Diagnosis not present

## 2020-10-23 NOTE — Patient Instructions (Signed)

## 2021-01-16 DIAGNOSIS — U071 COVID-19: Secondary | ICD-10-CM | POA: Diagnosis not present

## 2021-01-16 DIAGNOSIS — J22 Unspecified acute lower respiratory infection: Secondary | ICD-10-CM | POA: Diagnosis not present

## 2021-03-14 DIAGNOSIS — I10 Essential (primary) hypertension: Secondary | ICD-10-CM | POA: Diagnosis not present

## 2021-03-14 DIAGNOSIS — H1013 Acute atopic conjunctivitis, bilateral: Secondary | ICD-10-CM | POA: Diagnosis not present

## 2021-03-19 DIAGNOSIS — H6982 Other specified disorders of Eustachian tube, left ear: Secondary | ICD-10-CM | POA: Diagnosis not present

## 2021-03-19 DIAGNOSIS — H6122 Impacted cerumen, left ear: Secondary | ICD-10-CM | POA: Diagnosis not present

## 2021-08-17 DIAGNOSIS — E559 Vitamin D deficiency, unspecified: Secondary | ICD-10-CM | POA: Diagnosis not present

## 2021-08-17 DIAGNOSIS — E039 Hypothyroidism, unspecified: Secondary | ICD-10-CM | POA: Diagnosis not present

## 2021-08-17 DIAGNOSIS — E782 Mixed hyperlipidemia: Secondary | ICD-10-CM | POA: Diagnosis not present

## 2021-08-17 DIAGNOSIS — I5032 Chronic diastolic (congestive) heart failure: Secondary | ICD-10-CM | POA: Diagnosis not present

## 2021-08-17 DIAGNOSIS — R7303 Prediabetes: Secondary | ICD-10-CM | POA: Diagnosis not present

## 2021-08-17 DIAGNOSIS — I10 Essential (primary) hypertension: Secondary | ICD-10-CM | POA: Diagnosis not present

## 2021-08-29 DIAGNOSIS — J019 Acute sinusitis, unspecified: Secondary | ICD-10-CM | POA: Diagnosis not present

## 2021-08-29 DIAGNOSIS — B309 Viral conjunctivitis, unspecified: Secondary | ICD-10-CM | POA: Diagnosis not present

## 2021-12-24 DIAGNOSIS — J40 Bronchitis, not specified as acute or chronic: Secondary | ICD-10-CM | POA: Diagnosis not present

## 2022-01-02 DIAGNOSIS — J019 Acute sinusitis, unspecified: Secondary | ICD-10-CM | POA: Diagnosis not present

## 2022-01-09 DIAGNOSIS — H1033 Unspecified acute conjunctivitis, bilateral: Secondary | ICD-10-CM | POA: Diagnosis not present

## 2022-01-17 DIAGNOSIS — H1033 Unspecified acute conjunctivitis, bilateral: Secondary | ICD-10-CM | POA: Diagnosis not present

## 2022-01-21 DIAGNOSIS — H5712 Ocular pain, left eye: Secondary | ICD-10-CM | POA: Diagnosis not present

## 2022-01-21 DIAGNOSIS — H16042 Marginal corneal ulcer, left eye: Secondary | ICD-10-CM | POA: Diagnosis not present

## 2022-01-21 DIAGNOSIS — H11822 Conjunctivochalasis, left eye: Secondary | ICD-10-CM | POA: Diagnosis not present

## 2022-01-21 DIAGNOSIS — H11432 Conjunctival hyperemia, left eye: Secondary | ICD-10-CM | POA: Diagnosis not present

## 2022-01-24 DIAGNOSIS — H16042 Marginal corneal ulcer, left eye: Secondary | ICD-10-CM | POA: Diagnosis not present

## 2022-01-24 DIAGNOSIS — H11432 Conjunctival hyperemia, left eye: Secondary | ICD-10-CM | POA: Diagnosis not present

## 2022-01-24 DIAGNOSIS — H5712 Ocular pain, left eye: Secondary | ICD-10-CM | POA: Diagnosis not present

## 2022-06-04 DIAGNOSIS — H16141 Punctate keratitis, right eye: Secondary | ICD-10-CM | POA: Diagnosis not present

## 2022-08-23 ENCOUNTER — Emergency Department (HOSPITAL_BASED_OUTPATIENT_CLINIC_OR_DEPARTMENT_OTHER): Payer: BLUE CROSS/BLUE SHIELD

## 2022-08-23 ENCOUNTER — Encounter (HOSPITAL_BASED_OUTPATIENT_CLINIC_OR_DEPARTMENT_OTHER): Payer: Self-pay

## 2022-08-23 ENCOUNTER — Emergency Department (HOSPITAL_BASED_OUTPATIENT_CLINIC_OR_DEPARTMENT_OTHER)
Admission: EM | Admit: 2022-08-23 | Discharge: 2022-08-24 | Disposition: A | Discharge status: Home / Self Care | Payer: BLUE CROSS/BLUE SHIELD | Attending: Emergency Medicine | Admitting: Emergency Medicine

## 2022-08-23 ENCOUNTER — Other Ambulatory Visit: Payer: Self-pay

## 2022-08-23 DIAGNOSIS — M79605 Pain in left leg: Secondary | ICD-10-CM | POA: Diagnosis not present

## 2022-08-23 DIAGNOSIS — E039 Hypothyroidism, unspecified: Secondary | ICD-10-CM | POA: Insufficient documentation

## 2022-08-23 DIAGNOSIS — Z87891 Personal history of nicotine dependence: Secondary | ICD-10-CM | POA: Diagnosis not present

## 2022-08-23 DIAGNOSIS — W5911XA Bitten by nonvenomous snake, initial encounter: Secondary | ICD-10-CM

## 2022-08-23 DIAGNOSIS — Z79899 Other long term (current) drug therapy: Secondary | ICD-10-CM | POA: Insufficient documentation

## 2022-08-23 DIAGNOSIS — R600 Localized edema: Secondary | ICD-10-CM | POA: Insufficient documentation

## 2022-08-23 DIAGNOSIS — S99912A Unspecified injury of left ankle, initial encounter: Secondary | ICD-10-CM | POA: Diagnosis not present

## 2022-08-23 DIAGNOSIS — I10 Essential (primary) hypertension: Secondary | ICD-10-CM | POA: Diagnosis not present

## 2022-08-23 DIAGNOSIS — T63091A Toxic effect of venom of other snake, accidental (unintentional), initial encounter: Secondary | ICD-10-CM | POA: Diagnosis not present

## 2022-08-23 DIAGNOSIS — T63001A Toxic effect of unspecified snake venom, accidental (unintentional), initial encounter: Secondary | ICD-10-CM | POA: Diagnosis not present

## 2022-08-23 DIAGNOSIS — Z23 Encounter for immunization: Secondary | ICD-10-CM | POA: Insufficient documentation

## 2022-08-23 DIAGNOSIS — Z96642 Presence of left artificial hip joint: Secondary | ICD-10-CM | POA: Diagnosis not present

## 2022-08-23 DIAGNOSIS — Z96653 Presence of artificial knee joint, bilateral: Secondary | ICD-10-CM | POA: Insufficient documentation

## 2022-08-23 DIAGNOSIS — T63001D Toxic effect of unspecified snake venom, accidental (unintentional), subsequent encounter: Secondary | ICD-10-CM | POA: Diagnosis not present

## 2022-08-23 DIAGNOSIS — S91042A Puncture wound with foreign body, left ankle, initial encounter: Secondary | ICD-10-CM | POA: Insufficient documentation

## 2022-08-23 DIAGNOSIS — S8992XA Unspecified injury of left lower leg, initial encounter: Secondary | ICD-10-CM | POA: Diagnosis not present

## 2022-08-23 LAB — CBC WITH DIFFERENTIAL/PLATELET
Abs Immature Granulocytes: 0.02 10*3/uL (ref 0.00–0.07)
Basophils Absolute: 0 10*3/uL (ref 0.0–0.1)
Basophils Relative: 1 %
Eosinophils Absolute: 0.1 10*3/uL (ref 0.0–0.5)
Eosinophils Relative: 2 %
HCT: 41.8 % (ref 39.0–52.0)
Hemoglobin: 15 g/dL (ref 13.0–17.0)
Immature Granulocytes: 0 %
Lymphocytes Relative: 28 %
Lymphs Abs: 1.7 10*3/uL (ref 0.7–4.0)
MCH: 31.4 pg (ref 26.0–34.0)
MCHC: 35.9 g/dL (ref 30.0–36.0)
MCV: 87.4 fL (ref 80.0–100.0)
Monocytes Absolute: 0.6 10*3/uL (ref 0.1–1.0)
Monocytes Relative: 10 %
Neutro Abs: 3.6 10*3/uL (ref 1.7–7.7)
Neutrophils Relative %: 59 %
Platelets: 243 10*3/uL (ref 150–400)
RBC: 4.78 MIL/uL (ref 4.22–5.81)
RDW: 12.9 % (ref 11.5–15.5)
WBC: 6.1 10*3/uL (ref 4.0–10.5)
nRBC: 0 % (ref 0.0–0.2)

## 2022-08-23 LAB — PROTIME-INR
INR: 0.9 (ref 0.8–1.2)
Prothrombin Time: 12.5 seconds (ref 11.4–15.2)

## 2022-08-23 LAB — COMPREHENSIVE METABOLIC PANEL
ALT: 33 U/L (ref 0–44)
AST: 27 U/L (ref 15–41)
Albumin: 3.7 g/dL (ref 3.5–5.0)
Alkaline Phosphatase: 60 U/L (ref 38–126)
Anion gap: 8 (ref 5–15)
BUN: 9 mg/dL (ref 6–20)
CO2: 28 mmol/L (ref 22–32)
Calcium: 9 mg/dL (ref 8.9–10.3)
Chloride: 105 mmol/L (ref 98–111)
Creatinine, Ser: 0.91 mg/dL (ref 0.61–1.24)
GFR, Estimated: >60 mL/min (ref >60)
Glucose, Bld: 95 mg/dL (ref 70–99)
Potassium: 3.1 mmol/L — ABNORMAL LOW (ref 3.5–5.1)
Sodium: 141 mmol/L (ref 135–145)
Total Bilirubin: 0.7 mg/dL (ref 0.3–1.2)
Total Protein: 6.6 g/dL (ref 6.5–8.1)

## 2022-08-23 LAB — FIBRINOGEN: Fibrinogen: 396 mg/dL (ref 210–475)

## 2022-08-23 MED ORDER — ONDANSETRON HCL 4 MG/2ML IJ SOLN
4.0000 mg | Freq: Once | INTRAMUSCULAR | Status: AC
  Administered 2022-08-23: 4 mg via INTRAVENOUS
  Filled 2022-08-23: qty 2

## 2022-08-23 MED ORDER — POTASSIUM CHLORIDE 20 MEQ PO PACK
40.0000 meq | PACK | Freq: Once | ORAL | Status: AC
  Administered 2022-08-23: 40 meq via ORAL
  Filled 2022-08-23: qty 2

## 2022-08-23 MED ORDER — MORPHINE SULFATE (PF) 4 MG/ML IV SOLN
4.0000 mg | Freq: Once | INTRAVENOUS | Status: AC
  Administered 2022-08-23: 4 mg via INTRAVENOUS
  Filled 2022-08-23: qty 1

## 2022-08-23 MED ORDER — ACETAMINOPHEN 500 MG PO TABS
1000.0000 mg | ORAL_TABLET | Freq: Once | ORAL | Status: AC
  Administered 2022-08-23: 1000 mg via ORAL
  Filled 2022-08-23: qty 2

## 2022-08-23 MED ORDER — TETANUS-DIPHTH-ACELL PERTUSSIS 5-2.5-18.5 LF-MCG/0.5 IM SUSY
0.5000 mL | PREFILLED_SYRINGE | Freq: Once | INTRAMUSCULAR | Status: AC
  Administered 2022-08-23: 0.5 mL via INTRAMUSCULAR
  Filled 2022-08-23: qty 0.5

## 2022-08-23 NOTE — ED Provider Notes (Signed)
[facility] EMERGENCY DEPARTMENT Provider Note  CSN: [id number] Arrival date & time: 08/23/22 1805  Chief Complaint(s) Snake Bite  HPI Bryan [name] is a 56 y.o. male with history of hypertension, hypothyroidism presenting to the emergency department with snakebite.  Patient reports that he was bit by a copperhead on the left lower leg.  He reports was approximately 40 minutes prior to arrival.  He reports pain at the site, denies swelling.  Denies any numbness or tingling.  Denies any nausea or vomiting.  Denies any bruising or bleeding since the episode.  He reports a single bite.   Past Medical History Past Medical History:  Diagnosis Date   Allergy    takes Claritin daily   Elevated cholesterol    takes Crestor daily   Enlarged prostate    takes Flomax nightly   GERD (gastroesophageal reflux disease)    takes Zantac daily   Hypertension    takes Lisinopril and Bystolic daily   Hypothyroidism    takes Synthroid daily   Medical history non-contributory    Neuromuscular disorder (HCC)    bells palsy   OSA (obstructive sleep apnea)    uses CPAP   Primary localized osteoarthritis of left knee 03/25/2017   Primary localized osteoarthritis of right knee    S/P total knee arthroplasty, 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 Authorizing Provider  acetaminophen (TYLENOL) 500 MG tablet Take 500-1,000 mg by mouth every 6 (six) hours  as needed (for pain.).    [provider]  amLODipine (NORVASC) 10 MG tablet Take 1 tablet (10 mg total) by mouth daily. 11/26/18   Nahser, Deloris Ping, MD  BYSTOLIC 20 MG TABS TAKE 1 TABLET BY MOUTH EVERY DAY 05/21/19   Nahser, Deloris Ping, MD  cetirizine (ZYRTEC) 10 MG tablet Take 1 tablet by mouth daily as needed.    [provider]  clobetasol ointment (TEMOVATE) 0.05 % Apply 1 application topically daily as needed (to affected areas for psoriasis).     [provider]  famotidine (PEPCID) 10 MG tablet Take 1 tablet by mouth at bedtime.    [provider]  levothyroxine (SYNTHROID, LEVOTHROID) 300 MCG tablet Take 300 mcg by mouth daily before breakfast.  12/29/16   Salvatore Marvel, MD  loratadine (CLARITIN) 10 MG tablet Take 1 tablet by mouth daily as needed.    [provider]  olmesartan (BENICAR) 40 MG tablet Take 1 tablet (40 mg total) by mouth daily. Please make annual appt with Dr. Elease Hashimoto for refills. [phone]. 1st attempt. 01/04/20   Nahser, Deloris Ping, MD  ranitidine (ZANTAC) 75 MG tablet Take 75-150 mg by mouth See admin instructions. Take 75 mg by mouth in the morning then 75 mg in the afternoon then 150 mg at bedtime    [provider]  rosuvastatin (CRESTOR) 20 MG tablet Take 20 mg by mouth at bedtime.  [provider]  tamsulosin (FLOMAX) 0.4 MG CAPS capsule Take 0.4 mg by mouth at bedtime. 12/29/16   [provider]                                                                                                                                    Past Surgical History Past Surgical History:  Procedure Laterality Date   I & D KNEE WITH POLY EXCHANGE Right 08/30/2018   Procedure: RIGHT TOTAL KNEE WASHOUT AND POLY EXCHANGE;  Surgeon: Salvatore Marvel, MD;  Location: [facility] OR;  Service: Orthopedics;  Laterality: Right;   JOINT REPLACEMENT     MENISCUS REPAIR Right 2017   torn meniscus   NO PAST SURGERIES     TOTAL KNEE  ARTHROPLASTY Right 02/26/2017   Procedure: TOTAL KNEE ARTHROPLASTY;  Surgeon: Salvatore Marvel, MD;  Location: [facility] OR;  Service: Orthopedics;  Laterality: Right;   TOTAL KNEE ARTHROPLASTY Left 04/07/2017   TOTAL KNEE ARTHROPLASTY Left 04/07/2017   Procedure: LEFT TOTAL KNEE ARTHROPLASTY;  Surgeon: Salvatore Marvel, MD;  Location: [facility] OR;  Service: Orthopedics;  Laterality: Left;   Family History Family History  Problem Relation Age of Onset   Heart attack Father        stent x 2   Atrial fibrillation Father    Deep vein thrombosis Mother    Rheum arthritis Mother    Pulmonary embolism Other    Hypothyroidism Other     Social History Social History   Tobacco Use   Smoking status: Never   Smokeless tobacco: Former    Quit date: 01/30/2007  Vaping Use   Vaping Use: Never used  Substance Use Topics   Alcohol use: Yes    Alcohol/week: 3.0 standard drinks of alcohol    Types: 3 Cans of beer per week    Comment: daily   Drug use: No   Allergies Lipitor [atorvastatin calcium] and Penicillins  Review of Systems Review of Systems  All other systems reviewed and are negative.   Physical Exam Vital Signs  I have reviewed the triage vital signs BP (!) 154/82   Pulse 71   Temp 98.2 F (36.8 C) (Oral)   Resp 16   Ht 6\' 2"  (1.88 m)   Wt (!) 149.7 kg   SpO2 100%   BMI 42.37 kg/m  Physical Exam Vitals and nursing note reviewed.  Constitutional:      General: He is not in acute distress.    Appearance: Normal appearance.  HENT:     Mouth/Throat:     Mouth: Mucous membranes are moist.  Eyes:     Conjunctiva/sclera: Conjunctivae normal.  Cardiovascular:     Rate and Rhythm: Normal rate and regular rhythm.  Pulmonary:     Effort: Pulmonary effort is normal. No respiratory distress.     Breath sounds: Normal breath sounds.  Abdominal:     General: Abdomen is flat.  Palpations: Abdomen is soft.     Tenderness: There is no abdominal tenderness.  Musculoskeletal:     Right  lower leg: Edema present.     Left lower leg: Edema present.     Comments: Symmetric trace bilateral lower extremity edema.  2 punctate bites to the lateral left ankle  Skin:    General: Skin is warm and dry.     Capillary Refill: Capillary refill takes less than 2 seconds.  Neurological:     Mental Status: He is alert and oriented to person, place, and time. Mental status is at baseline.  Psychiatric:        Mood and Affect: Mood normal.        Behavior: Behavior normal.     ED Results and Treatments Labs (all labs ordered are listed, but only abnormal results are displayed) Labs Reviewed  COMPREHENSIVE METABOLIC PANEL  PROTIME-INR  FIBRINOGEN  CBC WITH DIFFERENTIAL/PLATELET                                                                                                                          Radiology No results found.  Pertinent labs & imaging results that were available during my care of the patient were reviewed by me and considered in my medical decision making (see MDM for details).  Medications Ordered in ED Medications  Tdap (BOOSTRIX) injection 0.5 mL (has no administration in time range)  acetaminophen (TYLENOL) tablet 1,000 mg (has no administration in time range)                                                                                                                                     Procedures Procedures  (including critical care time)  Medical Decision Making / ED Course   MDM:  56 year old male presenting to the emergency department with snakebite.  Patient overall well-appearing, exam with 2 small puncture wounds to the left lower extremity.  Patient has symmetrical bilateral lower extremity edema with no evidence of increased edema on the left as opposed to the right.  No symptoms such as paresthesias.  Will monitor closely, obtain fibrinogen, platelets, PT/INR.  Will observe.  If no swelling, labs are reassuring, patient may be able to  discharge home.      Additional history obtained: -Additional history obtained from {wsadditionalhistorian:28072} -External records from outside source obtained and reviewed including: Chart review including previous notes,  labs, imaging, consultation notes   Lab Tests: -I ordered, reviewed, and interpreted labs.   The pertinent results include:   Labs Reviewed  COMPREHENSIVE METABOLIC PANEL  PROTIME-INR  FIBRINOGEN  CBC WITH DIFFERENTIAL/PLATELET      EKG   EKG Interpretation  Date/Time:    Ventricular Rate:    PR Interval:    QRS Duration:   QT Interval:    QTC Calculation:   R Axis:     Text Interpretation:           Imaging Studies ordered: I ordered imaging studies including *** On my interpretation imaging demonstrates *** I independently visualized and interpreted imaging. I agree with the radiologist interpretation   Medicines ordered and prescription drug management: Meds ordered this encounter  Medications   Tdap (BOOSTRIX) injection 0.5 mL   acetaminophen (TYLENOL) tablet 1,000 mg    -I have reviewed the patients home medicines and have made adjustments as needed   Consultations Obtained: I requested consultation with the ***,  and discussed lab and imaging findings as well as pertinent plan - they recommend: ***   Cardiac Monitoring: The patient was maintained on a cardiac monitor.  I personally viewed and interpreted the cardiac monitored which showed an underlying rhythm of: ***  Social Determinants of Health:  Factors impacting patients care include: {wssoc:28071}   Reevaluation: After the interventions noted above, I reevaluated the patient and found that they have {resolved/improved/worsened:23923::"improved"}  Co morbidities that complicate the patient evaluation  Past Medical History:  Diagnosis Date   Allergy    takes Claritin daily   Elevated cholesterol    takes Crestor daily   Enlarged prostate    takes Flomax nightly    GERD (gastroesophageal reflux disease)    takes Zantac daily   Hypertension    takes Lisinopril and Bystolic daily   Hypothyroidism    takes Synthroid daily   Medical history non-contributory    Neuromuscular disorder (HCC)    bells palsy   OSA (obstructive sleep apnea)    uses CPAP   Primary localized osteoarthritis of left knee 03/25/2017   Primary localized osteoarthritis of right knee    S/P total knee arthroplasty, 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  Despite best efforts to proofread,  errors can occur which can change the documentation meaning.

## 2022-08-23 NOTE — ED Notes (Addendum)
Presents with snake bite, copperhead, to left lateral aspect of calf on left leg. Sm amt of bleeding was present, no active bleeding at this time., area cleaned, pt describes pain a "bee sting" type pain. Site is WNL except for two very small puncture type wounds, no redness or discoloration noted at bite site. Appears in no distress at this time. Placed on cont cardiac monitoring, ED MD at bedside

## 2022-08-23 NOTE — ED Triage Notes (Signed)
Patient was bit by a copperhead x today aprox 40 min ago. Bite marks noted to left lower calf

## 2022-08-24 ENCOUNTER — Other Ambulatory Visit: Payer: Self-pay

## 2022-08-24 ENCOUNTER — Encounter (HOSPITAL_COMMUNITY): Payer: Self-pay

## 2022-08-24 ENCOUNTER — Emergency Department (HOSPITAL_COMMUNITY)
Admission: EM | Admit: 2022-08-24 | Discharge: 2022-08-24 | Disposition: A | Discharge status: Home / Self Care | Payer: BLUE CROSS/BLUE SHIELD | Source: Home / Self Care | Attending: Emergency Medicine | Admitting: Emergency Medicine

## 2022-08-24 DIAGNOSIS — T63001D Toxic effect of unspecified snake venom, accidental (unintentional), subsequent encounter: Secondary | ICD-10-CM | POA: Diagnosis not present

## 2022-08-24 DIAGNOSIS — S81852D Open bite, left lower leg, subsequent encounter: Secondary | ICD-10-CM | POA: Insufficient documentation

## 2022-08-24 DIAGNOSIS — W5911XD Bitten by nonvenomous snake, subsequent encounter: Secondary | ICD-10-CM

## 2022-08-24 DIAGNOSIS — M79605 Pain in left leg: Secondary | ICD-10-CM | POA: Insufficient documentation

## 2022-08-24 LAB — BASIC METABOLIC PANEL
Anion gap: 12 (ref 5–15)
BUN: 12 mg/dL (ref 6–20)
CO2: 24 mmol/L (ref 22–32)
Calcium: 8.7 mg/dL — ABNORMAL LOW (ref 8.9–10.3)
Chloride: 102 mmol/L (ref 98–111)
Creatinine, Ser: 0.92 mg/dL (ref 0.61–1.24)
GFR, Estimated: >60 mL/min (ref >60)
Glucose, Bld: 125 mg/dL — ABNORMAL HIGH (ref 70–99)
Potassium: 3.4 mmol/L — ABNORMAL LOW (ref 3.5–5.1)
Sodium: 138 mmol/L (ref 135–145)

## 2022-08-24 LAB — PROTIME-INR
INR: 1 (ref 0.8–1.2)
Prothrombin Time: 13.2 seconds (ref 11.4–15.2)

## 2022-08-24 LAB — CBC WITH DIFFERENTIAL/PLATELET
Abs Immature Granulocytes: 0.04 10*3/uL (ref 0.00–0.07)
Basophils Absolute: 0 10*3/uL (ref 0.0–0.1)
Basophils Relative: 0 %
Eosinophils Absolute: 0.1 10*3/uL (ref 0.0–0.5)
Eosinophils Relative: 1 %
HCT: 44.2 % (ref 39.0–52.0)
Hemoglobin: 15.2 g/dL (ref 13.0–17.0)
Immature Granulocytes: 0 %
Lymphocytes Relative: 15 %
Lymphs Abs: 1.4 10*3/uL (ref 0.7–4.0)
MCH: 31.5 pg (ref 26.0–34.0)
MCHC: 34.4 g/dL (ref 30.0–36.0)
MCV: 91.5 fL (ref 80.0–100.0)
Monocytes Absolute: 0.9 10*3/uL (ref 0.1–1.0)
Monocytes Relative: 9 %
Neutro Abs: 7.3 10*3/uL (ref 1.7–7.7)
Neutrophils Relative %: 75 %
Platelets: 233 10*3/uL (ref 150–400)
RBC: 4.83 MIL/uL (ref 4.22–5.81)
RDW: 13.2 % (ref 11.5–15.5)
WBC: 9.8 10*3/uL (ref 4.0–10.5)
nRBC: 0 % (ref 0.0–0.2)

## 2022-08-24 LAB — FIBRINOGEN: Fibrinogen: 438 mg/dL (ref 210–475)

## 2022-08-24 MED ORDER — HYDROCODONE-ACETAMINOPHEN 5-325 MG PO TABS: 1.0000 | ORAL_TABLET | ORAL | 0 refills | Status: AC | PRN

## 2022-08-24 MED ORDER — OXYCODONE-ACETAMINOPHEN 5-325 MG PO TABS
1.0000 | ORAL_TABLET | Freq: Once | ORAL | Status: AC
  Administered 2022-08-24: 1 via ORAL
  Filled 2022-08-24: qty 1

## 2022-08-24 NOTE — ED Provider Triage Note (Signed)
Emergency Medicine Provider Triage Evaluation Note  EDWEN [name] , a 56 y.o. male  was evaluated in triage.  Pt complains of snake bite.  Happened yesterday, seen at med center.  Been having worsening swelling to the lower extremity, called poison control who told him to come to ED for CroFab  Review of Systems  Per HP:I  Physical Exam  BP (!) 175/85   Pulse 76   Temp 98.1 F (36.7 C) (Oral)   Resp 18   Ht 6\' 2"  (1.88 m)   Wt (!) 149.7 kg   SpO2 96%   BMI 42.37 kg/m  Gen:   Awake, no distress   Resp:  Normal effort  MSK:   Moves extremities without difficulty  Other:  Left lower extremity with pitting edema and erythema.  Puncture wound to lateral ankle  Medical Decision Making  Medically screening exam initiated at 5:11 PM.  Appropriate orders placed.  Bryan [name] was informed that the remainder of the evaluation will be completed by another provider, this initial triage assessment does not replace that evaluation, and the importance of remaining in the ED until their evaluation is complete.     [name], PA-C 08/24/22 1712

## 2022-08-24 NOTE — ED Notes (Signed)
Measured circumference of affected extremity areas per poison control protocol. PA notified of pt progress

## 2022-08-24 NOTE — Discharge Instructions (Addendum)
We evaluated you in the emergency department for your snakebite.  Your lab tests were reassuring and did not show sign of dangerous effect of the snakebite.  We measured your leg, and it did not show increased swelling compared to your right leg.  We discussed your symptoms with poison control, and you did not need antivenom.  Poison control will call you to follow-up and see how you are doing.  Keep your leg elevated, use compression bandages, and ice your leg as well as taking medicine for pain.  Please return to the emergency department if you develop any new or worsening symptoms such as severe pain, fevers or chills, easy bleeding or bruising, numbness or tingling, or swelling to the leg.

## 2022-08-24 NOTE — Discharge Instructions (Signed)
Follow up with primary. Your leg swelling has not worsened so no need for antivenom. Return if worse

## 2022-08-24 NOTE — ED Provider Notes (Signed)
[facility] EMERGENCY DEPARTMENT Provider Note   CSN: [id number] Arrival date & time: 08/24/22  1644     History  Chief Complaint  Patient presents with   Snake Bite    Bryan [name] is a 56 y.o. male.  HPI   Patient presents due to left lower extremity pain following a snakebite yesterday.  He believes he was bit by a copperhead, he was seen at [facility] with negative work-up and no progression of the swelling so sent home.  Feels that the swelling is gotten worse today.  Spoke with poison control who advised to go to ED for additional work-up and possible CroFab.  Patient denies any chest pain, shortness of breath, nausea, vomiting, fevers, abdominal pain, sensation deficits.  Home Medications Prior to Admission medications   Medication Sig Start Date End Date Taking? Authorizing Provider  acetaminophen (TYLENOL) 500 MG tablet Take 500-1,000 mg by mouth every 6 (six) hours as needed (for pain.).    [provider]  amLODipine (NORVASC) 10 MG tablet Take 1 tablet (10 mg total) by mouth daily. 11/26/18   Nahser, Deloris Ping, MD  BYSTOLIC 20 MG TABS TAKE 1 TABLET BY MOUTH EVERY DAY 05/21/19   Nahser, Deloris Ping, MD  cetirizine (ZYRTEC) 10 MG tablet Take 1 tablet by mouth daily as needed.    [provider]  clobetasol ointment (TEMOVATE) 0.05 % Apply 1 application topically daily as needed (to affected areas for psoriasis).     [provider]  famotidine (PEPCID) 10 MG tablet Take 1 tablet by mouth at bedtime.    [provider]  HYDROcodone-acetaminophen (NORCO/VICODIN) 5-325 MG tablet Take 1 tablet by mouth every 4 (four) hours as needed. 08/24/22   Lonell Grandchild, MD  levothyroxine (SYNTHROID, LEVOTHROID) 300 MCG tablet Take 300 mcg by mouth daily before breakfast.  12/29/16   Salvatore Marvel, MD  loratadine (CLARITIN) 10 MG tablet Take 1 tablet by mouth daily as needed.    [provider]  olmesartan  (BENICAR) 40 MG tablet Take 1 tablet (40 mg total) by mouth daily. Please make annual appt with Dr. Elease Hashimoto for refills. [phone]. 1st attempt. 01/04/20   Nahser, Deloris Ping, MD  ranitidine (ZANTAC) 75 MG tablet Take 75-150 mg by mouth See admin instructions. Take 75 mg by mouth in the morning then 75 mg in the afternoon then 150 mg at bedtime    [provider]  rosuvastatin (CRESTOR) 20 MG tablet Take 20 mg by mouth at bedtime.     [provider]  tamsulosin (FLOMAX) 0.4 MG CAPS capsule Take 0.4 mg by mouth at bedtime. 12/29/16   [provider]      Allergies    Lipitor [atorvastatin calcium] and Penicillins    Review of Systems   Review of Systems  Physical Exam Updated Vital Signs BP 125/68   Pulse 75   Temp 98.5 F (36.9 C)   Resp 18   Ht 6\' 2"  (1.88 m)   Wt (!) 149.7 kg   SpO2 99%   BMI 42.37 kg/m  Physical Exam Vitals and nursing note reviewed. Exam conducted with a chaperone present.  Constitutional:      Appearance: Normal appearance.  HENT:     Head: Normocephalic and atraumatic.  Eyes:     General: No scleral icterus.       Right eye: No discharge.        Left eye: No discharge.  Extraocular Movements: Extraocular movements intact.     Pupils: Pupils are equal, round, and reactive to light.  Cardiovascular:     Rate and Rhythm: Normal rate and regular rhythm.     Pulses: Normal pulses.     Heart sounds: Normal heart sounds. No murmur heard.    No friction rub. No gallop.  Pulmonary:     Effort: Pulmonary effort is normal. No respiratory distress.     Breath sounds: Normal breath sounds.  Abdominal:     General: Abdomen is flat. Bowel sounds are normal. There is no distension.     Palpations: Abdomen is soft.     Tenderness: There is no abdominal tenderness.  Musculoskeletal:     Left lower leg: Edema present.  Skin:    General: Skin is warm and dry.     Capillary Refill: Capillary refill takes less than 2 seconds.      Coloration: Skin is not jaundiced.     Findings: Erythema present.  Neurological:     Mental Status: He is alert. Mental status is at baseline.     Coordination: Coordination normal.          ED Results / Procedures / Treatments   Labs (all labs ordered are listed, but only abnormal results are displayed) Labs Reviewed  BASIC METABOLIC PANEL - Abnormal; Notable for the following components:      Result Value   Potassium 3.4 (*)    Glucose, Bld 125 (*)    Calcium 8.7 (*)    All other components within normal limits  CBC WITH DIFFERENTIAL/PLATELET  PROTIME-INR  FIBRINOGEN  CBC WITH DIFFERENTIAL/PLATELET  CBC WITH DIFFERENTIAL/PLATELET  PROTIME-INR  PROTIME-INR  FIBRINOGEN  FIBRINOGEN    EKG None  Radiology DG Tibia/Fibula Left  Result Date: 08/23/2022 CLINICAL DATA:  Comprehend snake bite to lateral aspect of the LEFT calf. EXAM: LEFT TIBIA AND FIBULA - 2 VIEW COMPARISON:  None Available. FINDINGS: Osseous structures are unremarkable. LEFT knee arthroplasty hardware appears intact and appropriately positioned. Soft tissues of the calf are unremarkable. No soft tissue gas or radiodense foreign body is seen within the soft tissues. IMPRESSION: Negative. Electronically Signed   By: Franki Cabot M.D.   On: 08/23/2022 18:50    Procedures Procedures    Medications Ordered in ED Medications - No data to display  ED Course/ Medical Decision Making/ A&P                           Medical Decision Making Amount and/or Complexity of Data Reviewed Labs: ordered.   Patient presents due to snakebite that occurred yesterday.  Differential includes but not limited to acute toxicity, localized reaction, thromboocclusive crisis.  On exam patient is neurovascular intact.  He does have lower extremity edema with some swelling and a puncture wound to the lateral ankle.  Please see photos attached in chart.  I considered CroFab but I did consult with poison control who advised  measuring the calf over the next hour and if there is no progression patient is stable for outpatient follow-up with PCP.    Ordered, viewed and interpreted laboratory work-up.  Platelet count is normal, PT/INR is normal, fibrinogen is normal.  Patient is nontoxic-appearing.  No Patient has been observed in the ED for over an hour.  No increase in swelling or progressive lower extremity edema.  I do feel patient is appropriate for outpatient follow-up and I do not see an indication for  CroFab.  Discussed with attending who also agrees with this plan.  Discussed HPI, physical exam and plan of care for this patient with attending Lynden Oxford.. The attending physician evaluated this patient as part of a shared visit and agrees with plan of care.         Final Clinical Impression(s) / ED Diagnoses Final diagnoses:  Snake bite, subsequent encounter    Rx / DC Orders ED Discharge Orders     None         Theron Arista, Cordelia Poche 08/24/22 2106    Tegeler, Canary Brim, MD 08/24/22 2129

## 2022-08-24 NOTE — ED Triage Notes (Signed)
Patient reports was bit yesterday by copperhead and was seen in at [facility] and was told if it gets worse with swelling and pain come to get crofab.

## 2022-08-29 DIAGNOSIS — K219 Gastro-esophageal reflux disease without esophagitis: Secondary | ICD-10-CM | POA: Diagnosis not present

## 2022-08-29 DIAGNOSIS — Z0000 Encounter for general adult medical examination without abnormal findings: Secondary | ICD-10-CM | POA: Diagnosis not present

## 2022-08-29 DIAGNOSIS — I10 Essential (primary) hypertension: Secondary | ICD-10-CM | POA: Diagnosis not present

## 2022-08-29 DIAGNOSIS — E782 Mixed hyperlipidemia: Secondary | ICD-10-CM | POA: Diagnosis not present

## 2022-08-29 DIAGNOSIS — J309 Allergic rhinitis, unspecified: Secondary | ICD-10-CM | POA: Diagnosis not present

## 2022-08-29 DIAGNOSIS — R7303 Prediabetes: Secondary | ICD-10-CM | POA: Diagnosis not present

## 2022-08-29 DIAGNOSIS — Z125 Encounter for screening for malignant neoplasm of prostate: Secondary | ICD-10-CM | POA: Diagnosis not present

## 2022-08-29 DIAGNOSIS — E559 Vitamin D deficiency, unspecified: Secondary | ICD-10-CM | POA: Diagnosis not present

## 2022-08-29 DIAGNOSIS — E039 Hypothyroidism, unspecified: Secondary | ICD-10-CM | POA: Diagnosis not present

## 2022-10-09 ENCOUNTER — Other Ambulatory Visit: Payer: Self-pay | Admitting: Sports Medicine

## 2022-10-09 DIAGNOSIS — S46012A Strain of muscle(s) and tendon(s) of the rotator cuff of left shoulder, initial encounter: Secondary | ICD-10-CM | POA: Diagnosis not present

## 2022-10-09 DIAGNOSIS — S73192A Other sprain of left hip, initial encounter: Secondary | ICD-10-CM

## 2022-10-29 ENCOUNTER — Ambulatory Visit
Admission: RE | Admit: 2022-10-29 | Discharge: 2022-10-29 | Disposition: A | Discharge status: Home / Self Care | Payer: BLUE CROSS/BLUE SHIELD | Source: Ambulatory Visit | Attending: Sports Medicine | Admitting: Sports Medicine

## 2022-10-29 ENCOUNTER — Other Ambulatory Visit: Payer: Self-pay | Admitting: Sports Medicine

## 2022-10-29 DIAGNOSIS — S46112A Strain of muscle, fascia and tendon of long head of biceps, left arm, initial encounter: Secondary | ICD-10-CM

## 2022-10-29 DIAGNOSIS — S46012A Strain of muscle(s) and tendon(s) of the rotator cuff of left shoulder, initial encounter: Secondary | ICD-10-CM | POA: Diagnosis not present

## 2022-10-29 DIAGNOSIS — S43432A Superior glenoid labrum lesion of left shoulder, initial encounter: Secondary | ICD-10-CM

## 2022-10-29 DIAGNOSIS — S46122A Laceration of muscle, fascia and tendon of long head of biceps, left arm, initial encounter: Secondary | ICD-10-CM | POA: Diagnosis not present

## 2022-10-29 MED ORDER — IOPAMIDOL (ISOVUE-M 200) INJECTION 41%
13.0000 mL | Freq: Once | INTRAMUSCULAR | Status: AC
  Administered 2022-10-29: 13 mL via INTRA_ARTICULAR

## 2022-11-05 DIAGNOSIS — M19012 Primary osteoarthritis, left shoulder: Secondary | ICD-10-CM | POA: Diagnosis not present
